# Patient Record
Sex: Female | Born: 1945 | Hispanic: No | Marital: Single | Smoking: Former smoker
Health system: Southern US, Community
[De-identification: ages and names within clinical notes are randomized; demographics above are authoritative.]

## PROBLEM LIST (undated history)

## (undated) DIAGNOSIS — N2889 Other specified disorders of kidney and ureter: Secondary | ICD-10-CM

## (undated) DIAGNOSIS — E119 Type 2 diabetes mellitus without complications: Secondary | ICD-10-CM

## (undated) DIAGNOSIS — I10 Essential (primary) hypertension: Secondary | ICD-10-CM

## (undated) DIAGNOSIS — H269 Unspecified cataract: Secondary | ICD-10-CM

## (undated) DIAGNOSIS — D649 Anemia, unspecified: Secondary | ICD-10-CM

## (undated) DIAGNOSIS — E039 Hypothyroidism, unspecified: Secondary | ICD-10-CM

## (undated) DIAGNOSIS — E1165 Type 2 diabetes mellitus with hyperglycemia: Secondary | ICD-10-CM

## (undated) DIAGNOSIS — E782 Mixed hyperlipidemia: Secondary | ICD-10-CM

## (undated) DIAGNOSIS — N182 Chronic kidney disease, stage 2 (mild): Secondary | ICD-10-CM

## (undated) DIAGNOSIS — Z794 Long term (current) use of insulin: Secondary | ICD-10-CM

## (undated) DIAGNOSIS — I129 Hypertensive chronic kidney disease with stage 1 through stage 4 chronic kidney disease, or unspecified chronic kidney disease: Secondary | ICD-10-CM

## (undated) DIAGNOSIS — I739 Peripheral vascular disease, unspecified: Secondary | ICD-10-CM

## (undated) HISTORY — DX: Anemia, unspecified: D64.9

## (undated) HISTORY — DX: Mixed hyperlipidemia: E78.2

## (undated) HISTORY — PX: APPENDECTOMY: SHX54

## (undated) HISTORY — DX: Peripheral vascular disease, unspecified: I73.9

## (undated) HISTORY — DX: Hypertensive chronic kidney disease with stage 1 through stage 4 chronic kidney disease, or unspecified chronic kidney disease: I12.9

## (undated) HISTORY — PX: COLONOSCOPY: SHX174

## (undated) HISTORY — DX: Hypothyroidism, unspecified: E03.9

## (undated) HISTORY — DX: Essential (primary) hypertension: I10

## (undated) HISTORY — PX: CATARACT EXTRACTION, BILATERAL: SHX1313

## (undated) HISTORY — DX: Morbid (severe) obesity due to excess calories: E66.01

## (undated) HISTORY — DX: Other specified disorders of kidney and ureter: N28.89

## (undated) HISTORY — DX: Chronic kidney disease, stage 2 (mild): N18.2

## (undated) HISTORY — DX: Long term (current) use of insulin: Z79.4

## (undated) HISTORY — DX: Type 2 diabetes mellitus with hyperglycemia: E11.65

## (undated) HISTORY — DX: Type 2 diabetes mellitus without complications: E11.9

## (undated) HISTORY — PX: CATARACT EXTRACTION W/ INTRAOCULAR LENS IMPLANT: SHX1309

## (undated) HISTORY — DX: Unspecified cataract: H26.9

---

## 1977-11-12 HISTORY — PX: TUBAL LIGATION: SHX77

## 2010-09-21 ENCOUNTER — Ambulatory Visit: Payer: Self-pay | Admitting: Family Medicine

## 2010-12-03 ENCOUNTER — Encounter: Payer: Self-pay | Admitting: Emergency Medicine

## 2011-12-12 ENCOUNTER — Ambulatory Visit: Payer: Self-pay | Admitting: Family Medicine

## 2013-02-11 ENCOUNTER — Ambulatory Visit: Payer: Self-pay

## 2013-02-16 ENCOUNTER — Ambulatory Visit: Payer: Self-pay

## 2014-02-18 ENCOUNTER — Ambulatory Visit: Payer: Self-pay | Admitting: Family Medicine

## 2014-11-12 DIAGNOSIS — H269 Unspecified cataract: Secondary | ICD-10-CM | POA: Insufficient documentation

## 2014-11-12 HISTORY — DX: Unspecified cataract: H26.9

## 2015-04-07 DIAGNOSIS — Z1231 Encounter for screening mammogram for malignant neoplasm of breast: Secondary | ICD-10-CM | POA: Diagnosis not present

## 2015-09-06 DIAGNOSIS — Z23 Encounter for immunization: Secondary | ICD-10-CM | POA: Diagnosis not present

## 2015-10-05 DIAGNOSIS — J020 Streptococcal pharyngitis: Secondary | ICD-10-CM | POA: Diagnosis not present

## 2015-10-10 DIAGNOSIS — J069 Acute upper respiratory infection, unspecified: Secondary | ICD-10-CM | POA: Diagnosis not present

## 2015-11-01 DIAGNOSIS — Z9181 History of falling: Secondary | ICD-10-CM | POA: Diagnosis not present

## 2015-11-01 DIAGNOSIS — I10 Essential (primary) hypertension: Secondary | ICD-10-CM | POA: Diagnosis not present

## 2015-11-01 DIAGNOSIS — Z1389 Encounter for screening for other disorder: Secondary | ICD-10-CM | POA: Diagnosis not present

## 2015-11-01 DIAGNOSIS — E785 Hyperlipidemia, unspecified: Secondary | ICD-10-CM | POA: Diagnosis not present

## 2015-11-01 DIAGNOSIS — E119 Type 2 diabetes mellitus without complications: Secondary | ICD-10-CM | POA: Diagnosis not present

## 2015-11-01 DIAGNOSIS — E559 Vitamin D deficiency, unspecified: Secondary | ICD-10-CM | POA: Diagnosis not present

## 2015-11-01 DIAGNOSIS — Z780 Asymptomatic menopausal state: Secondary | ICD-10-CM | POA: Diagnosis not present

## 2015-11-01 DIAGNOSIS — Z79899 Other long term (current) drug therapy: Secondary | ICD-10-CM | POA: Diagnosis not present

## 2015-11-24 DIAGNOSIS — H26493 Other secondary cataract, bilateral: Secondary | ICD-10-CM | POA: Diagnosis not present

## 2015-11-24 DIAGNOSIS — H524 Presbyopia: Secondary | ICD-10-CM | POA: Diagnosis not present

## 2015-11-24 DIAGNOSIS — E113592 Type 2 diabetes mellitus with proliferative diabetic retinopathy without macular edema, left eye: Secondary | ICD-10-CM | POA: Diagnosis not present

## 2015-11-28 DIAGNOSIS — Z1231 Encounter for screening mammogram for malignant neoplasm of breast: Secondary | ICD-10-CM | POA: Diagnosis not present

## 2015-11-28 DIAGNOSIS — M8589 Other specified disorders of bone density and structure, multiple sites: Secondary | ICD-10-CM | POA: Diagnosis not present

## 2015-11-28 DIAGNOSIS — Z13820 Encounter for screening for osteoporosis: Secondary | ICD-10-CM | POA: Diagnosis not present

## 2015-12-22 DIAGNOSIS — E113493 Type 2 diabetes mellitus with severe nonproliferative diabetic retinopathy without macular edema, bilateral: Secondary | ICD-10-CM | POA: Diagnosis not present

## 2016-01-11 DIAGNOSIS — M791 Myalgia: Secondary | ICD-10-CM | POA: Diagnosis not present

## 2016-01-11 DIAGNOSIS — Z20828 Contact with and (suspected) exposure to other viral communicable diseases: Secondary | ICD-10-CM | POA: Diagnosis not present

## 2016-01-30 DIAGNOSIS — Z7689 Persons encountering health services in other specified circumstances: Secondary | ICD-10-CM | POA: Diagnosis not present

## 2016-01-30 DIAGNOSIS — E119 Type 2 diabetes mellitus without complications: Secondary | ICD-10-CM | POA: Diagnosis not present

## 2016-01-31 DIAGNOSIS — Z23 Encounter for immunization: Secondary | ICD-10-CM | POA: Diagnosis not present

## 2016-01-31 DIAGNOSIS — E119 Type 2 diabetes mellitus without complications: Secondary | ICD-10-CM | POA: Diagnosis not present

## 2016-01-31 DIAGNOSIS — E785 Hyperlipidemia, unspecified: Secondary | ICD-10-CM | POA: Diagnosis not present

## 2016-01-31 DIAGNOSIS — E1140 Type 2 diabetes mellitus with diabetic neuropathy, unspecified: Secondary | ICD-10-CM | POA: Diagnosis not present

## 2016-01-31 DIAGNOSIS — I10 Essential (primary) hypertension: Secondary | ICD-10-CM | POA: Diagnosis not present

## 2016-05-14 DIAGNOSIS — E119 Type 2 diabetes mellitus without complications: Secondary | ICD-10-CM | POA: Diagnosis not present

## 2016-05-14 DIAGNOSIS — Z79899 Other long term (current) drug therapy: Secondary | ICD-10-CM | POA: Diagnosis not present

## 2016-05-14 DIAGNOSIS — E559 Vitamin D deficiency, unspecified: Secondary | ICD-10-CM | POA: Diagnosis not present

## 2016-05-14 DIAGNOSIS — E1140 Type 2 diabetes mellitus with diabetic neuropathy, unspecified: Secondary | ICD-10-CM | POA: Diagnosis not present

## 2016-05-14 DIAGNOSIS — E785 Hyperlipidemia, unspecified: Secondary | ICD-10-CM | POA: Diagnosis not present

## 2016-05-14 DIAGNOSIS — I10 Essential (primary) hypertension: Secondary | ICD-10-CM | POA: Diagnosis not present

## 2016-05-29 DIAGNOSIS — E1140 Type 2 diabetes mellitus with diabetic neuropathy, unspecified: Secondary | ICD-10-CM | POA: Diagnosis not present

## 2016-05-29 DIAGNOSIS — Z139 Encounter for screening, unspecified: Secondary | ICD-10-CM | POA: Diagnosis not present

## 2016-05-29 DIAGNOSIS — I10 Essential (primary) hypertension: Secondary | ICD-10-CM | POA: Diagnosis not present

## 2016-06-14 DIAGNOSIS — E113393 Type 2 diabetes mellitus with moderate nonproliferative diabetic retinopathy without macular edema, bilateral: Secondary | ICD-10-CM | POA: Diagnosis not present

## 2016-09-03 DIAGNOSIS — I10 Essential (primary) hypertension: Secondary | ICD-10-CM | POA: Diagnosis not present

## 2016-09-03 DIAGNOSIS — Z79899 Other long term (current) drug therapy: Secondary | ICD-10-CM | POA: Diagnosis not present

## 2016-09-03 DIAGNOSIS — Z23 Encounter for immunization: Secondary | ICD-10-CM | POA: Diagnosis not present

## 2016-09-03 DIAGNOSIS — E1140 Type 2 diabetes mellitus with diabetic neuropathy, unspecified: Secondary | ICD-10-CM | POA: Diagnosis not present

## 2016-09-03 DIAGNOSIS — E785 Hyperlipidemia, unspecified: Secondary | ICD-10-CM | POA: Diagnosis not present

## 2016-10-17 DIAGNOSIS — E113393 Type 2 diabetes mellitus with moderate nonproliferative diabetic retinopathy without macular edema, bilateral: Secondary | ICD-10-CM | POA: Diagnosis not present

## 2016-12-04 DIAGNOSIS — E6609 Other obesity due to excess calories: Secondary | ICD-10-CM | POA: Diagnosis not present

## 2016-12-04 DIAGNOSIS — I10 Essential (primary) hypertension: Secondary | ICD-10-CM | POA: Diagnosis not present

## 2016-12-04 DIAGNOSIS — E1065 Type 1 diabetes mellitus with hyperglycemia: Secondary | ICD-10-CM | POA: Diagnosis not present

## 2017-01-01 DIAGNOSIS — I10 Essential (primary) hypertension: Secondary | ICD-10-CM | POA: Diagnosis not present

## 2017-01-01 DIAGNOSIS — E1065 Type 1 diabetes mellitus with hyperglycemia: Secondary | ICD-10-CM | POA: Diagnosis not present

## 2017-02-25 DIAGNOSIS — E113393 Type 2 diabetes mellitus with moderate nonproliferative diabetic retinopathy without macular edema, bilateral: Secondary | ICD-10-CM | POA: Diagnosis not present

## 2017-03-12 DIAGNOSIS — E1065 Type 1 diabetes mellitus with hyperglycemia: Secondary | ICD-10-CM | POA: Diagnosis not present

## 2017-05-07 DIAGNOSIS — I10 Essential (primary) hypertension: Secondary | ICD-10-CM | POA: Diagnosis not present

## 2017-05-07 DIAGNOSIS — E1065 Type 1 diabetes mellitus with hyperglycemia: Secondary | ICD-10-CM | POA: Diagnosis not present

## 2017-07-16 DIAGNOSIS — E1165 Type 2 diabetes mellitus with hyperglycemia: Secondary | ICD-10-CM | POA: Diagnosis not present

## 2017-07-16 DIAGNOSIS — E6609 Other obesity due to excess calories: Secondary | ICD-10-CM | POA: Diagnosis not present

## 2017-07-16 DIAGNOSIS — I10 Essential (primary) hypertension: Secondary | ICD-10-CM | POA: Diagnosis not present

## 2017-08-05 DIAGNOSIS — E113291 Type 2 diabetes mellitus with mild nonproliferative diabetic retinopathy without macular edema, right eye: Secondary | ICD-10-CM | POA: Diagnosis not present

## 2017-08-05 DIAGNOSIS — E113292 Type 2 diabetes mellitus with mild nonproliferative diabetic retinopathy without macular edema, left eye: Secondary | ICD-10-CM | POA: Diagnosis not present

## 2017-08-05 DIAGNOSIS — E113393 Type 2 diabetes mellitus with moderate nonproliferative diabetic retinopathy without macular edema, bilateral: Secondary | ICD-10-CM | POA: Diagnosis not present

## 2017-09-12 DIAGNOSIS — E113393 Type 2 diabetes mellitus with moderate nonproliferative diabetic retinopathy without macular edema, bilateral: Secondary | ICD-10-CM | POA: Diagnosis not present

## 2017-10-30 DIAGNOSIS — E113292 Type 2 diabetes mellitus with mild nonproliferative diabetic retinopathy without macular edema, left eye: Secondary | ICD-10-CM | POA: Diagnosis not present

## 2017-10-30 DIAGNOSIS — H40003 Preglaucoma, unspecified, bilateral: Secondary | ICD-10-CM | POA: Diagnosis not present

## 2017-10-30 DIAGNOSIS — H524 Presbyopia: Secondary | ICD-10-CM | POA: Diagnosis not present

## 2017-11-26 DIAGNOSIS — E6609 Other obesity due to excess calories: Secondary | ICD-10-CM | POA: Diagnosis not present

## 2017-11-26 DIAGNOSIS — I10 Essential (primary) hypertension: Secondary | ICD-10-CM | POA: Diagnosis not present

## 2017-11-26 DIAGNOSIS — E1165 Type 2 diabetes mellitus with hyperglycemia: Secondary | ICD-10-CM | POA: Diagnosis not present

## 2018-02-13 DIAGNOSIS — E113393 Type 2 diabetes mellitus with moderate nonproliferative diabetic retinopathy without macular edema, bilateral: Secondary | ICD-10-CM | POA: Diagnosis not present

## 2018-03-11 DIAGNOSIS — E6609 Other obesity due to excess calories: Secondary | ICD-10-CM | POA: Diagnosis not present

## 2018-03-11 DIAGNOSIS — I10 Essential (primary) hypertension: Secondary | ICD-10-CM | POA: Diagnosis not present

## 2018-03-11 DIAGNOSIS — E1065 Type 1 diabetes mellitus with hyperglycemia: Secondary | ICD-10-CM | POA: Diagnosis not present

## 2018-03-11 DIAGNOSIS — E1165 Type 2 diabetes mellitus with hyperglycemia: Secondary | ICD-10-CM | POA: Diagnosis not present

## 2018-06-26 DIAGNOSIS — E113499 Type 2 diabetes mellitus with severe nonproliferative diabetic retinopathy without macular edema, unspecified eye: Secondary | ICD-10-CM | POA: Diagnosis not present

## 2018-07-16 DIAGNOSIS — I10 Essential (primary) hypertension: Secondary | ICD-10-CM | POA: Diagnosis not present

## 2018-07-22 DIAGNOSIS — Z23 Encounter for immunization: Secondary | ICD-10-CM | POA: Diagnosis not present

## 2018-07-22 DIAGNOSIS — Z0000 Encounter for general adult medical examination without abnormal findings: Secondary | ICD-10-CM | POA: Diagnosis not present

## 2018-07-22 DIAGNOSIS — Z780 Asymptomatic menopausal state: Secondary | ICD-10-CM | POA: Diagnosis not present

## 2018-07-29 DIAGNOSIS — I10 Essential (primary) hypertension: Secondary | ICD-10-CM | POA: Diagnosis not present

## 2018-07-29 DIAGNOSIS — Z0000 Encounter for general adult medical examination without abnormal findings: Secondary | ICD-10-CM | POA: Diagnosis not present

## 2018-07-29 DIAGNOSIS — N182 Chronic kidney disease, stage 2 (mild): Secondary | ICD-10-CM | POA: Diagnosis not present

## 2018-07-29 DIAGNOSIS — I129 Hypertensive chronic kidney disease with stage 1 through stage 4 chronic kidney disease, or unspecified chronic kidney disease: Secondary | ICD-10-CM | POA: Diagnosis not present

## 2018-07-29 DIAGNOSIS — E782 Mixed hyperlipidemia: Secondary | ICD-10-CM | POA: Diagnosis not present

## 2018-07-29 DIAGNOSIS — E6609 Other obesity due to excess calories: Secondary | ICD-10-CM | POA: Diagnosis not present

## 2018-07-29 DIAGNOSIS — E1169 Type 2 diabetes mellitus with other specified complication: Secondary | ICD-10-CM | POA: Diagnosis not present

## 2018-07-29 DIAGNOSIS — E1165 Type 2 diabetes mellitus with hyperglycemia: Secondary | ICD-10-CM | POA: Diagnosis not present

## 2018-07-29 DIAGNOSIS — Z794 Long term (current) use of insulin: Secondary | ICD-10-CM | POA: Diagnosis not present

## 2018-08-05 DIAGNOSIS — I129 Hypertensive chronic kidney disease with stage 1 through stage 4 chronic kidney disease, or unspecified chronic kidney disease: Secondary | ICD-10-CM | POA: Diagnosis not present

## 2018-08-05 DIAGNOSIS — Z1212 Encounter for screening for malignant neoplasm of rectum: Secondary | ICD-10-CM | POA: Diagnosis not present

## 2018-08-05 DIAGNOSIS — E1165 Type 2 diabetes mellitus with hyperglycemia: Secondary | ICD-10-CM | POA: Diagnosis not present

## 2018-08-05 DIAGNOSIS — E782 Mixed hyperlipidemia: Secondary | ICD-10-CM | POA: Diagnosis not present

## 2018-08-05 DIAGNOSIS — N182 Chronic kidney disease, stage 2 (mild): Secondary | ICD-10-CM | POA: Diagnosis not present

## 2018-08-05 DIAGNOSIS — Z1211 Encounter for screening for malignant neoplasm of colon: Secondary | ICD-10-CM | POA: Diagnosis not present

## 2018-08-05 LAB — COLOGUARD

## 2018-08-19 DIAGNOSIS — R195 Other fecal abnormalities: Secondary | ICD-10-CM | POA: Diagnosis not present

## 2018-09-02 ENCOUNTER — Encounter: Payer: Self-pay | Admitting: Gastroenterology

## 2018-09-25 DIAGNOSIS — E113412 Type 2 diabetes mellitus with severe nonproliferative diabetic retinopathy with macular edema, left eye: Secondary | ICD-10-CM | POA: Diagnosis not present

## 2018-09-25 DIAGNOSIS — E113413 Type 2 diabetes mellitus with severe nonproliferative diabetic retinopathy with macular edema, bilateral: Secondary | ICD-10-CM | POA: Diagnosis not present

## 2018-10-01 ENCOUNTER — Ambulatory Visit: Payer: Medicare HMO | Admitting: Gastroenterology

## 2018-10-01 ENCOUNTER — Encounter (INDEPENDENT_AMBULATORY_CARE_PROVIDER_SITE_OTHER): Payer: Self-pay

## 2018-10-01 ENCOUNTER — Encounter: Payer: Self-pay | Admitting: Gastroenterology

## 2018-10-01 VITALS — BP 132/58 | HR 76 | Ht 60.25 in | Wt 211.5 lb

## 2018-10-01 DIAGNOSIS — R195 Other fecal abnormalities: Secondary | ICD-10-CM | POA: Diagnosis not present

## 2018-10-01 NOTE — Patient Instructions (Signed)
You have been scheduled for a colonoscopy. Please follow written instructions given to you at your visit today.  Please pick up your prep supplies at the pharmacy within the next 1-3 days. If you use inhalers (even only as needed), please bring them with you on the day of your procedure.  We have given you a Plenvu sample prep kit today   If you are age 70 or older, your body mass index should be between 23-30. Your Body mass index is 40.96 kg/m. If this is out of the aforementioned range listed, please consider follow up with your Primary Care Provider.  If you are age 70 or younger, your body mass index should be between 19-25. Your Body mass index is 40.96 kg/m. If this is out of the aformentioned range listed, please consider follow up with your Primary Care Provider.    Thank you for choosing [facility]  Karleen Hampshire Nandigam,MD

## 2018-10-01 NOTE — Progress Notes (Signed)
Sara [name]    [id number]    1945-12-05  Primary Care Physician:Ramachandran, Mauro Kaufmann, MD  Referring Physician: Merrilee Seashore, MD   Chief complaint: Positive cologaurd  HPI:  72 year old female with history of hypertension, diabetes here for evaluation of positive cologaurd. Denies any blood in stool or blood per rectum.  Never had colonoscopy. Denies any recent change in bowel habits, loss of appetite or weight loss. No heartburn, regurgitation, nausea, vomiting, dysphagia, abdominal pain. No family history of colon cancer. No history of NSAID use, antiplatelets or anticoagulants.   Outpatient Encounter Medications as of 10/01/2018  Medication Sig  . amLODipine (NORVASC) 10 MG tablet Take 10 mg by mouth daily.  Marland Kitchen lisinopril-hydrochlorothiazide (PRINZIDE,ZESTORETIC) 20-12.5 MG tablet Take 1 tablet by mouth daily.  . metFORMIN (GLUCOPHAGE) 1000 MG tablet Take 1,000 mg by mouth daily with breakfast.  . NOVOLOG MIX 70/30 FLEXPEN (70-30) 100 UNIT/ML FlexPen INJECT 25 UNITS SUBCUTANEOUSLY IN THE MORNING THEN INJECT 5 UNITS AT LUNCH AND 40 UNITS IN THE EVENING. TITRATE TO MMD 100 UNITS   No facility-administered encounter medications on file as of 10/01/2018.     Allergies as of 10/01/2018  . (Not on File)    Past Medical History:  Diagnosis Date  . Anemia   . DM (diabetes mellitus) ([place])   . HTN (hypertension)     Past Surgical History:  Procedure Laterality Date  . APPENDECTOMY    . CATARACT EXTRACTION, BILATERAL    . CESAREAN SECTION    . TUBAL LIGATION      Family History  Problem Relation Age of Onset  . Heart disease Mother   . Diabetes Brother   . Diabetes Maternal Uncle     Social History   Socioeconomic History  . Marital status: Single    Spouse name: Not on file  . Number of children: 2  . Years of education: Not on file  . Highest education level: Not on file  Occupational History  . Occupation: retired  Scientific laboratory technician    . Financial resource strain: Not on file  . Food insecurity:    Worry: Not on file    Inability: Not on file  . Transportation needs:    Medical: Not on file    Non-medical: Not on file  Tobacco Use  . Smoking status: Former Smoker    Types: Cigarettes    Last attempt to quit: 10/01/2008    Years since quitting: 10.0  . Smokeless tobacco: Never Used  Substance and Sexual Activity  . Alcohol use: Yes    Comment: occasional  . Drug use: Never  . Sexual activity: Not on file  Lifestyle  . Physical activity:    Days per week: Not on file    Minutes per session: Not on file  . Stress: Not on file  Relationships  . Social connections:    Talks on phone: Not on file    Gets together: Not on file    Attends religious service: Not on file    Active member of club or organization: Not on file    Attends meetings of clubs or organizations: Not on file    Relationship status: Not on file  . Intimate partner violence:    Fear of current or ex partner: Not on file    Emotionally abused: Not on file    Physically abused: Not on file    Forced sexual activity: Not on file  Other Topics  Concern  . Not on file  Social History Narrative  . Not on file      Review of systems: Review of Systems  Constitutional: Negative for fever and chills.  HENT: Negative.   Eyes: Negative for blurred vision.  Respiratory: Negative for cough, shortness of breath and wheezing.   Cardiovascular: Negative for chest pain and palpitations.  Gastrointestinal: as per HPI Genitourinary: Negative for dysuria, urgency, frequency and hematuria.  Musculoskeletal: Positive for myalgias, back pain and joint pain.  Skin: Negative for itching and rash.  Neurological: Negative for dizziness, tremors, focal weakness, seizures and loss of consciousness.  Endo/Heme/Allergies: Positive for seasonal allergies.  Psychiatric/Behavioral: Negative for depression, suicidal ideas and hallucinations.  All other systems  reviewed and are negative.   Physical Exam: Vitals:   10/01/18 0925  BP: (!) 132/58  Pulse: 76   Body mass index is 40.96 kg/m. Gen:      No acute distress HEENT:  EOMI, sclera anicteric Neck:     No masses; no thyromegaly Lungs:    Clear to auscultation bilaterally; normal respiratory effort CV:         Regular rate and rhythm; no murmurs Abd:      + bowel sounds; soft, non-tender; no palpable masses, no distension Ext:    Mild ankle edema; adequate peripheral perfusion Skin:      Warm and dry; no rash Neuro: alert and oriented x 3 Psych: normal mood and affect  Data Reviewed:  Reviewed labs, radiology imaging, old records and pertinent past GI work up   Assessment and Plan/Recommendations: 72 year old female with history of hypertension, diabetes and morbid obesity with positive cologaurd We will schedule for colonoscopy for further evaluation The risks and benefits as well as alternatives of endoscopic procedure(s) have been discussed and reviewed. All questions answered. The patient agrees to proceed.    Damaris Hippo , MD [phone]    CC: No ref. provider found

## 2018-10-21 DIAGNOSIS — E113411 Type 2 diabetes mellitus with severe nonproliferative diabetic retinopathy with macular edema, right eye: Secondary | ICD-10-CM | POA: Diagnosis not present

## 2018-10-21 DIAGNOSIS — E113413 Type 2 diabetes mellitus with severe nonproliferative diabetic retinopathy with macular edema, bilateral: Secondary | ICD-10-CM | POA: Diagnosis not present

## 2018-10-21 DIAGNOSIS — E113412 Type 2 diabetes mellitus with severe nonproliferative diabetic retinopathy with macular edema, left eye: Secondary | ICD-10-CM | POA: Diagnosis not present

## 2018-10-28 ENCOUNTER — Ambulatory Visit (AMBULATORY_SURGERY_CENTER): Payer: Medicare HMO | Admitting: Gastroenterology

## 2018-10-28 ENCOUNTER — Encounter: Payer: Self-pay | Admitting: Gastroenterology

## 2018-10-28 VITALS — BP 128/68 | HR 71 | Temp 97.5°F | Resp 17 | Ht 60.0 in | Wt 211.0 lb

## 2018-10-28 DIAGNOSIS — D123 Benign neoplasm of transverse colon: Secondary | ICD-10-CM | POA: Diagnosis not present

## 2018-10-28 DIAGNOSIS — E119 Type 2 diabetes mellitus without complications: Secondary | ICD-10-CM | POA: Diagnosis not present

## 2018-10-28 DIAGNOSIS — D122 Benign neoplasm of ascending colon: Secondary | ICD-10-CM

## 2018-10-28 DIAGNOSIS — I10 Essential (primary) hypertension: Secondary | ICD-10-CM | POA: Diagnosis not present

## 2018-10-28 DIAGNOSIS — D120 Benign neoplasm of cecum: Secondary | ICD-10-CM

## 2018-10-28 DIAGNOSIS — D124 Benign neoplasm of descending colon: Secondary | ICD-10-CM

## 2018-10-28 DIAGNOSIS — R195 Other fecal abnormalities: Secondary | ICD-10-CM

## 2018-10-28 MED ORDER — SODIUM CHLORIDE 0.9 % IV SOLN: 500.0000 mL | Freq: Once | INTRAVENOUS | Status: DC

## 2018-10-28 NOTE — Patient Instructions (Signed)
INFORMATION ON POLYPS,DIVERTICULOSIS,&HEMORRHOIDS GIVEN TO YOU TODAY  NO ASPIRIN, ASPIRIN CONTAINING PRODUCTS (BC OR GOODY POWDERS) OR NSAIDS (IBUPROFEN, ADVIL, ALEVE, AND MOTRIN) FOR 2 WEEKS TYLENOL IS OK TO TAKE  A CARD WAS GIVEN TO YOU TODAY THAT SAYS 2 CLIPS WERE PLACED IN YOUR TRANSVERSE COLON .CARRY THIS CARD IN YOUR WALLET IN CASE YOU HAVE TO HAVE ANY XRAY ,MRI ,OR OTHER PROCEDURE DONE THEY CAN BE AWARE   YOU HAD AN ENDOSCOPIC PROCEDURE TODAY AT [place]:   Refer to the procedure report that was given to you for any specific questions about what was found during the examination.  If the procedure report does not answer your questions, please call your gastroenterologist to clarify.  If you requested that your care partner not be given the details of your procedure findings, then the procedure report has been included in a sealed envelope for you to review at your convenience later.  YOU SHOULD EXPECT: Some feelings of bloating in the abdomen. Passage of more gas than usual.  Walking can help get rid of the air that was put into your GI tract during the procedure and reduce the bloating. If you had a lower endoscopy (such as a colonoscopy or flexible sigmoidoscopy) you may notice spotting of blood in your stool or on the toilet paper. If you underwent a bowel prep for your procedure, you may not have a normal bowel movement for a few days.  Please Note:  You might notice some irritation and congestion in your nose or some drainage.  This is from the oxygen used during your procedure.  There is no need for concern and it should clear up in a day or so.  SYMPTOMS TO REPORT IMMEDIATELY:   Following lower endoscopy (colonoscopy or flexible sigmoidoscopy):  Excessive amounts of blood in the stool  Significant tenderness or worsening of abdominal pains  Swelling of the abdomen that is new, acute  Fever of 100F or higher    For urgent or emergent issues, a  gastroenterologist can be reached at any hour by calling [phone].   DIET:  We do recommend a small meal at first, but then you may proceed to your regular diet.  Drink plenty of fluids but you should avoid alcoholic beverages for 24 hours.  ACTIVITY:  You should plan to take it easy for the rest of today and you should NOT DRIVE or use heavy machinery until tomorrow (because of the sedation medicines used during the test).    FOLLOW UP: Our staff will call the number listed on your records the next business day following your procedure to check on you and address any questions or concerns that you may have regarding the information given to you following your procedure. If we do not reach you, we will leave a message.  However, if you are feeling well and you are not experiencing any problems, there is no need to return our call.  We will assume that you have returned to your regular daily activities without incident.  If any biopsies were taken you will be contacted by phone or by letter within the next 1-3 weeks.  Please call us at [phone] if you have not heard about the biopsies in 3 weeks.    SIGNATURES/CONFIDENTIALITY: You and/or your care partner have signed paperwork which will be entered into your electronic medical record.  These signatures attest to the fact that that the information above on your After Visit Summary  has been reviewed and is understood.  Full responsibility of the confidentiality of this discharge information lies with you and/or your care-partner.

## 2018-10-28 NOTE — Progress Notes (Signed)
Called to room to assist during endoscopic procedure.  Patient ID and intended procedure confirmed with present staff. Received instructions for my participation in the procedure from the performing physician.  

## 2018-10-28 NOTE — Op Note (Signed)
[facility] Patient Name: Sara [name] Procedure Date: 10/28/2018 3:38 PM MRN: [id number] Endoscopist: Mauri Pole , MD Age: 72 Referring MD:  Date of Birth: 1945/12/20 Gender: Female Account #: [id number] Procedure:                Colonoscopy Indications:              Positive Cologuard test Medicines:                Monitored Anesthesia Care Procedure:                Pre-Anesthesia Assessment:                           - Prior to the procedure, a History and Physical                            was performed, and patient medications and                            allergies were reviewed. The patient's tolerance of                            previous anesthesia was also reviewed. The risks                            and benefits of the procedure and the sedation                            options and risks were discussed with the patient.                            All questions were answered, and informed consent                            was obtained. Prior Anticoagulants: The patient has                            taken no previous anticoagulant or antiplatelet                            agents. ASA Grade Assessment: III - A patient with                            severe systemic disease. After reviewing the risks                            and benefits, the patient was deemed in                            satisfactory condition to undergo the procedure.                           After obtaining informed consent, the colonoscope  was passed under direct vision. Throughout the                            procedure, the patient's blood pressure, pulse, and                            oxygen saturations were monitored continuously. The                            Colonoscope was introduced through the anus and                            advanced to the the cecum, identified by                            appendiceal orifice and ileocecal  valve. The                            colonoscopy was technically difficult and complex                            due to the patient's body habitus. The patient                            tolerated the procedure well. The quality of the                            bowel preparation was excellent. The ileocecal                            valve, appendiceal orifice, and rectum were                            photographed. Scope In: 3:40:48 PM Scope Out: 4:12:41 PM Scope Withdrawal Time: 0 hours 25 minutes 4 seconds  Total Procedure Duration: 0 hours 31 minutes 53 seconds  Findings:                 The perianal and digital rectal examinations were                            normal.                           Three sessile polyps were found in the transverse                            colon, ascending colon and cecum. The polyps were 4                            to 6 mm in size. These polyps were removed with a                            cold snare. Resection and retrieval were complete.  Three semi-pedunculated polyps were found in the                            sigmoid colon, ascending colon and ileocecal valve.                            The polyps were 7 to 11 mm in size. These polyps                            were removed with a hot snare. Resection and                            retrieval were complete.                           A 7 mm polyp was found in the transverse colon. The                            polyp was semi-pedunculated. The polyp was removed                            with a hot snare. Resection and retrieval were                            complete. To prevent bleeding after the                            polypectomy, two hemostatic clips were successfully                            placed (MR conditional). There was no bleeding at                            the end of the procedure.                           Three sessile polyps were found in  the descending                            colon and transverse colon. The polyps were 1 to 3                            mm in size. These polyps were removed with a cold                            biopsy forceps. Resection and retrieval were                            complete.                           Scattered small and large-mouthed diverticula were  found in the sigmoid colon, descending colon and                            ascending colon.                           Non-bleeding internal hemorrhoids were found during                            retroflexion. The hemorrhoids were small. Complications:            No immediate complications. Estimated Blood Loss:     Estimated blood loss was minimal. Impression:               - Three 4 to 6 mm polyps in the transverse colon,                            in the ascending colon and in the cecum, removed                            with a cold snare. Resected and retrieved.                           - Three 7 to 11 mm polyps in the sigmoid colon, in                            the ascending colon and at the ileocecal valve,                            removed with a hot snare. Resected and retrieved.                           - One 7 mm polyp in the transverse colon, removed                            with a hot snare. Resected and retrieved. Clips (MR                            conditional) were placed.                           - Three 1 to 3 mm polyps in the descending colon                            and in the transverse colon, removed with a cold                            biopsy forceps. Resected and retrieved.                           - Moderate diverticulosis in the sigmoid colon, in  the descending colon and in the ascending colon.                           - Non-bleeding internal hemorrhoids. Recommendation:           - Patient has a contact number available for                             emergencies. The signs and symptoms of potential                            delayed complications were discussed with the                            patient. Return to normal activities tomorrow.                            Written discharge instructions were provided to the                            patient.                           - Resume previous diet.                           - Continue present medications.                           - Await pathology results.                           - Repeat colonoscopy date to be determined after                            pending pathology results are reviewed for                            surveillance based on pathology results. Mauri Pole, MD 10/28/2018 4:19:28 PM This report has been signed electronically.

## 2018-10-28 NOTE — Progress Notes (Signed)
Report to PACU, RN, vss, BBS= Clear.  

## 2018-10-29 ENCOUNTER — Telehealth: Payer: Self-pay

## 2018-10-29 NOTE — Telephone Encounter (Signed)
  Follow up Call-  Call back number 10/28/2018  Post procedure Call Back phone  # [phone]  Permission to leave phone message Yes  Some recent data might be hidden     No answer

## 2018-10-29 NOTE — Telephone Encounter (Signed)
  Follow up Call-  Call back number 10/28/2018  Post procedure Call Back phone  # [phone]  Permission to leave phone message Yes  Some recent data might be hidden     Patient questions:  Do you have a fever, pain , or abdominal swelling? No. Pain Score  0 *  Have you tolerated food without any problems? Yes.    Have you been able to return to your normal activities? Yes.    Do you have any questions about your discharge instructions: Diet   No. Medications  No. Follow up visit  No.  Do you have questions or concerns about your Care? No.  Actions: * If pain score is 4 or above: No action needed, pain <4.  No problems noted per pt. maw

## 2018-11-03 ENCOUNTER — Encounter: Payer: Self-pay | Admitting: Gastroenterology

## 2018-12-04 DIAGNOSIS — E113413 Type 2 diabetes mellitus with severe nonproliferative diabetic retinopathy with macular edema, bilateral: Secondary | ICD-10-CM | POA: Diagnosis not present

## 2019-01-19 DIAGNOSIS — E113411 Type 2 diabetes mellitus with severe nonproliferative diabetic retinopathy with macular edema, right eye: Secondary | ICD-10-CM | POA: Diagnosis not present

## 2019-01-19 DIAGNOSIS — E113412 Type 2 diabetes mellitus with severe nonproliferative diabetic retinopathy with macular edema, left eye: Secondary | ICD-10-CM | POA: Diagnosis not present

## 2019-02-10 DIAGNOSIS — I10 Essential (primary) hypertension: Secondary | ICD-10-CM | POA: Diagnosis not present

## 2019-02-10 DIAGNOSIS — N182 Chronic kidney disease, stage 2 (mild): Secondary | ICD-10-CM | POA: Diagnosis not present

## 2019-02-10 DIAGNOSIS — E782 Mixed hyperlipidemia: Secondary | ICD-10-CM | POA: Diagnosis not present

## 2019-02-10 DIAGNOSIS — E1165 Type 2 diabetes mellitus with hyperglycemia: Secondary | ICD-10-CM | POA: Diagnosis not present

## 2019-04-16 DIAGNOSIS — E113412 Type 2 diabetes mellitus with severe nonproliferative diabetic retinopathy with macular edema, left eye: Secondary | ICD-10-CM | POA: Diagnosis not present

## 2019-05-06 DIAGNOSIS — I10 Essential (primary) hypertension: Secondary | ICD-10-CM | POA: Diagnosis not present

## 2019-05-06 DIAGNOSIS — E782 Mixed hyperlipidemia: Secondary | ICD-10-CM | POA: Diagnosis not present

## 2019-05-06 DIAGNOSIS — E1165 Type 2 diabetes mellitus with hyperglycemia: Secondary | ICD-10-CM | POA: Diagnosis not present

## 2019-05-12 DIAGNOSIS — E1165 Type 2 diabetes mellitus with hyperglycemia: Secondary | ICD-10-CM | POA: Diagnosis not present

## 2019-05-12 DIAGNOSIS — E782 Mixed hyperlipidemia: Secondary | ICD-10-CM | POA: Diagnosis not present

## 2019-05-12 DIAGNOSIS — I10 Essential (primary) hypertension: Secondary | ICD-10-CM | POA: Diagnosis not present

## 2019-06-04 DIAGNOSIS — E113412 Type 2 diabetes mellitus with severe nonproliferative diabetic retinopathy with macular edema, left eye: Secondary | ICD-10-CM | POA: Diagnosis not present

## 2019-07-23 DIAGNOSIS — E113292 Type 2 diabetes mellitus with mild nonproliferative diabetic retinopathy without macular edema, left eye: Secondary | ICD-10-CM | POA: Diagnosis not present

## 2019-08-27 DIAGNOSIS — E782 Mixed hyperlipidemia: Secondary | ICD-10-CM | POA: Diagnosis not present

## 2019-08-27 DIAGNOSIS — E1165 Type 2 diabetes mellitus with hyperglycemia: Secondary | ICD-10-CM | POA: Diagnosis not present

## 2019-08-27 DIAGNOSIS — I10 Essential (primary) hypertension: Secondary | ICD-10-CM | POA: Diagnosis not present

## 2019-09-02 DIAGNOSIS — Z0000 Encounter for general adult medical examination without abnormal findings: Secondary | ICD-10-CM | POA: Diagnosis not present

## 2019-09-02 DIAGNOSIS — N182 Chronic kidney disease, stage 2 (mild): Secondary | ICD-10-CM | POA: Diagnosis not present

## 2019-09-02 DIAGNOSIS — Z23 Encounter for immunization: Secondary | ICD-10-CM | POA: Diagnosis not present

## 2019-09-02 DIAGNOSIS — E1165 Type 2 diabetes mellitus with hyperglycemia: Secondary | ICD-10-CM | POA: Diagnosis not present

## 2019-09-02 DIAGNOSIS — E782 Mixed hyperlipidemia: Secondary | ICD-10-CM | POA: Diagnosis not present

## 2019-09-02 DIAGNOSIS — E1169 Type 2 diabetes mellitus with other specified complication: Secondary | ICD-10-CM | POA: Diagnosis not present

## 2019-10-12 ENCOUNTER — Encounter: Payer: Self-pay | Admitting: Gastroenterology

## 2019-10-15 ENCOUNTER — Encounter: Payer: Self-pay | Admitting: Gastroenterology

## 2019-10-15 DIAGNOSIS — E113393 Type 2 diabetes mellitus with moderate nonproliferative diabetic retinopathy without macular edema, bilateral: Secondary | ICD-10-CM | POA: Diagnosis not present

## 2019-11-03 DIAGNOSIS — E1165 Type 2 diabetes mellitus with hyperglycemia: Secondary | ICD-10-CM | POA: Diagnosis not present

## 2019-11-03 DIAGNOSIS — I10 Essential (primary) hypertension: Secondary | ICD-10-CM | POA: Diagnosis not present

## 2019-11-03 DIAGNOSIS — N182 Chronic kidney disease, stage 2 (mild): Secondary | ICD-10-CM | POA: Diagnosis not present

## 2019-11-03 DIAGNOSIS — Z794 Long term (current) use of insulin: Secondary | ICD-10-CM | POA: Diagnosis not present

## 2019-11-03 DIAGNOSIS — E782 Mixed hyperlipidemia: Secondary | ICD-10-CM | POA: Diagnosis not present

## 2019-11-18 ENCOUNTER — Other Ambulatory Visit: Payer: Self-pay

## 2019-11-18 ENCOUNTER — Ambulatory Visit (AMBULATORY_SURGERY_CENTER): Payer: Medicare Other | Admitting: *Deleted

## 2019-11-18 VITALS — Temp 96.7°F | Ht 60.0 in | Wt 210.4 lb

## 2019-11-18 DIAGNOSIS — Z86010 Personal history of colonic polyps: Secondary | ICD-10-CM

## 2019-11-18 DIAGNOSIS — Z1159 Encounter for screening for other viral diseases: Secondary | ICD-10-CM

## 2019-11-18 NOTE — Progress Notes (Signed)

## 2019-11-18 NOTE — Progress Notes (Signed)
Patient given Plenvu sample Lot [id number] exp 02/2020

## 2019-11-19 ENCOUNTER — Encounter: Payer: Self-pay | Admitting: Gastroenterology

## 2019-11-27 ENCOUNTER — Other Ambulatory Visit: Payer: Self-pay | Admitting: Gastroenterology

## 2019-11-27 ENCOUNTER — Ambulatory Visit (INDEPENDENT_AMBULATORY_CARE_PROVIDER_SITE_OTHER): Payer: Medicare Other

## 2019-11-27 DIAGNOSIS — Z1159 Encounter for screening for other viral diseases: Secondary | ICD-10-CM

## 2019-11-30 LAB — SARS CORONAVIRUS 2 (TAT 6-24 HRS): SARS Coronavirus 2: NEGATIVE

## 2019-12-01 ENCOUNTER — Ambulatory Visit (AMBULATORY_SURGERY_CENTER): Payer: Medicare Other | Admitting: Gastroenterology

## 2019-12-01 ENCOUNTER — Other Ambulatory Visit: Payer: Self-pay

## 2019-12-01 ENCOUNTER — Encounter: Payer: Self-pay | Admitting: Gastroenterology

## 2019-12-01 VITALS — BP 91/64 | HR 74 | Temp 98.6°F | Resp 15 | Ht 60.0 in | Wt 210.4 lb

## 2019-12-01 DIAGNOSIS — Z860100 Personal history of colon polyps, unspecified: Secondary | ICD-10-CM

## 2019-12-01 DIAGNOSIS — D123 Benign neoplasm of transverse colon: Secondary | ICD-10-CM

## 2019-12-01 DIAGNOSIS — D128 Benign neoplasm of rectum: Secondary | ICD-10-CM

## 2019-12-01 DIAGNOSIS — D124 Benign neoplasm of descending colon: Secondary | ICD-10-CM | POA: Diagnosis not present

## 2019-12-01 DIAGNOSIS — D125 Benign neoplasm of sigmoid colon: Secondary | ICD-10-CM | POA: Diagnosis not present

## 2019-12-01 DIAGNOSIS — D127 Benign neoplasm of rectosigmoid junction: Secondary | ICD-10-CM | POA: Diagnosis not present

## 2019-12-01 MED ORDER — SODIUM CHLORIDE 0.9 % IV SOLN: 500.0000 mL | Freq: Once | INTRAVENOUS | Status: DC

## 2019-12-01 NOTE — Progress Notes (Signed)
Called to room to assist during endoscopic procedure.  Patient ID and intended procedure confirmed with present staff. Received instructions for my participation in the procedure from the performing physician.  

## 2019-12-01 NOTE — Patient Instructions (Addendum)
HANDOUTS PROVIDED ON: POLYPS, DIVERTICULOSIS, & HEMORRHOIDS  The polyps removed today have been sent for pathology.  The results can take 1-3 weeks to receive.  When your next colonoscopy should occur will be based on the pathology results.    You may resume your previous diet and medication schedule.  Thank you for allowing [place] to care for you today!!!  USTED TUVO UN PROCEDIMIENTO ENDOSCPICO HOY EN EL [facility]:   Lea el informe del procedimiento que se le entreg para cualquier pregunta especfica sobre lo que se Primary school teacher.  Si el informe del examen no responde a sus preguntas, por favor llame a su gastroenterlogo para aclararlo.  Si usted solicit que no se le den [employer] de lo que se Estate manager/land agent en su procedimiento al [employer] va a cuidar, entonces el informe del procedimiento se ha incluido en un sobre sellado para que usted lo revise despus cuando le sea ms conveniente.   LO QUE PUEDE ESPERAR: Algunas sensaciones de hinchazn en el abdomen.  Puede tener ms gases de lo normal.  El caminar puede ayudarle a eliminar el aire que se le puso en el tracto gastrointestinal durante el procedimiento y reducir la hinchazn.  Si le hicieron una endoscopia inferior (como una colonoscopia o una sigmoidoscopia flexible), podra notar manchas de sangre en las heces fecales o en el papel higinico.  Si se someti a una preparacin intestinal para su procedimiento, es posible que no tenga una evacuacin intestinal normal durante [employer].   Tenga en cuenta:  Es posible que note un poco de irritacin y congestin en la nariz o algn drenaje.  Esto es debido al oxgeno [employer] durante su procedimiento.  No hay que preocuparse y esto debe desaparecer ms o Scientist, research (medical).   SNTOMAS PARA REPORTAR INMEDIATAMENTE:  Despus de una endoscopia inferior (colonoscopia o sigmoidoscopia flexible):  Cantidades excesivas de sangre en las heces fecales  Sensibilidad  significativa o empeoramiento de los dolores abdominales   Hinchazn aguda del abdomen que antes no tena   Fiebre de 100F o ms   Para asuntos urgentes o de Freight forwarder, puede comunicarse con un gastroenterlogo a cualquier hora llamando al [phone].  DIETA:  Recomendamos una comida pequea al principio, pero luego puede continuar con su dieta normal.  Tome muchos lquidos, Teacher, adult education las bebidas alcohlicas durante 24 horas.    ACTIVIDAD:  Debe planear tomarse las cosas con calma por el resto del da y no debe CONDUCIR ni usar maquinaria pesada Programmer, applications (debido a los medicamentos de sedacin utilizados durante el examen).     SEGUIMIENTO: Nuestro personal llamar al nmero que aparece en su historial al siguiente da hbil de su procedimiento para ver cmo se siente y para responder cualquier pregunta o inquietud que pueda tener con respecto a la informacin que se le dio despus del procedimiento. Si no podemos contactarle, le dejaremos un mensaje.  Sin embargo, si se siente bien y no tiene Paediatric nurse, no es necesario que nos devuelva la llamada.  Asumiremos que ha regresado a sus actividades diarias normales sin incidentes. Si se le tomaron algunas biopsias, le contactaremos por telfono o por carta en las prximas 3 semanas.  Si no ha sabido [employer] biopsias en el transcurso de 3 semanas, por favor llmenos al [phone].   FIRMAS/CONFIDENCIALIDAD: Usted y/o el acompaante que le cuide han firmado documentos que se ingresarn en su historial mdico Emergency planning/management officer.  Marlowe Sax  el hecho de que la informacin anterior

## 2019-12-01 NOTE — Progress Notes (Signed)
Report given to PACU, vss 

## 2019-12-01 NOTE — Op Note (Signed)
Sara [name] Patient Name: Sara [name] Procedure Date: 12/01/2019 9:18 AM MRN: [id number] Endoscopist: Mauri Pole , MD Age: 74 Referring MD:  Date of Birth: [birth year] Gender: Female Account #: [id number] Procedure:                Colonoscopy Indications:              High risk colon cancer surveillance: Personal                            history of colonic polyps, Surveillance: History of                            numerous (> 10) adenomas on last colonoscopy (< 3                            yrs), High risk colon cancer surveillance: Personal                            history of adenoma (10 mm or greater in size) Medicines:                Monitored Anesthesia Care Procedure:                Pre-Anesthesia Assessment:                           - Prior to the procedure, a History and Physical                            was performed, and patient medications and                            allergies were reviewed. The patient's tolerance of                            previous anesthesia was also reviewed. The risks                            and benefits of the procedure and the sedation                            options and risks were discussed with the patient.                            All questions were answered, and informed consent                            was obtained. Prior Anticoagulants: The patient has                            taken no previous anticoagulant or antiplatelet                            agents. ASA Grade Assessment: III - A patient with  severe systemic disease. After reviewing the risks                            and benefits, the patient was deemed in                            satisfactory condition to undergo the procedure.                           After obtaining informed consent, the colonoscope                            was passed under direct vision. Throughout the                             procedure, the patient's blood pressure, pulse, and                            oxygen saturations were monitored continuously. The                            Colonoscope was introduced through the anus and                            advanced to the the cecum, identified by                            appendiceal orifice and ileocecal valve. The                            colonoscopy was performed without difficulty. The                            patient tolerated the procedure well. The quality                            of the bowel preparation was excellent. The                            ileocecal valve, appendiceal orifice, and rectum                            were photographed. Scope In: 9:29:32 AM Scope Out: 9:52:10 AM Scope Withdrawal Time: 0 hours 15 minutes 21 seconds  Total Procedure Duration: 0 hours 22 minutes 38 seconds  Findings:                 The perianal and digital rectal examinations were                            normal.                           Three semi-pedunculated polyps were found in the  transverse colon. The polyps were 7 to 11 mm in                            size. These polyps were removed with a hot snare.                            Resection and retrieval were complete.                           Three sessile polyps were found in the rectum and                            sigmoid colon. The polyps were 5 to 7 mm in size.                            These polyps were removed with a cold snare.                            Resection and retrieval were complete.                           Four sessile polyps were found in the rectum and                            descending colon. The polyps were 1 to 2 mm in                            size. These polyps were removed with a cold biopsy                            forceps. Resection and retrieval were complete.                           Scattered small and large-mouthed diverticula were                             found in the sigmoid colon, descending colon,                            transverse colon, ascending colon and cecum.                           Non-bleeding internal hemorrhoids were found during                            retroflexion. The hemorrhoids were small. Complications:            No immediate complications. Estimated Blood Loss:     Estimated blood loss was minimal. Impression:               - Three 7 to 11 mm polyps in the transverse colon,  removed with a hot snare. Resected and retrieved.                           - Three 5 to 7 mm polyps in the rectum and in the                            sigmoid colon, removed with a cold snare. Resected                            and retrieved.                           - Four 1 to 2 mm polyps in the rectum and in the                            descending colon, removed with a cold biopsy                            forceps. Resected and retrieved.                           - Diverticulosis in the sigmoid colon, in the                            descending colon, in the transverse colon, in the                            ascending colon and in the cecum.                           - Non-bleeding internal hemorrhoids. Recommendation:           - Patient has a contact number available for                            emergencies. The signs and symptoms of potential                            delayed complications were discussed with the                            patient. Return to normal activities tomorrow.                            Written discharge instructions were provided to the                            patient.                           - Resume previous diet.                           - Continue present medications.                           -  Await pathology results.                           - Repeat colonoscopy in 3 years for surveillance                            based on  pathology results. Mauri Pole, MD 12/01/2019 9:58:54 AM This report has been signed electronically.

## 2019-12-01 NOTE — Progress Notes (Signed)
Temp JB  VS CW   Pt's states no medical or surgical changes since previsit or office visit.  Interpreter used today at the [facility] for this pt.  Interpreter's name is-Claudia

## 2019-12-03 ENCOUNTER — Telehealth: Payer: Self-pay

## 2019-12-03 NOTE — Telephone Encounter (Signed)
  Follow up Call-  Call back number 12/01/2019 10/28/2018  Post procedure Call Back phone  # [phone]  Permission to leave phone message Yes Yes  Some recent data might be hidden     Patient questions:  Do you have a fever, pain , or abdominal swelling? Yes.   Fever of 100.8. one timePain Score  0 *  Have you tolerated food without any problems? Yes.    Have you been able to return to your normal activities? Yes.    Do you have any questions about your discharge instructions: Diet   No. Medications  No. Follow up visit  No.  Do you have questions or concerns about your Care? No.  Actions: * If pain score is 4 or above: No action needed, pain <4.  1. Have you developed a fever since your procedure? Yes, 100.8 one time and no fever since.  2.   Have you had an respiratory symptoms (SOB or cough) since your procedure? no  3.   Have you tested positive for COVID 19 since your procedure no  4.   Have you had any family members/close contacts diagnosed with the COVID 19 since your procedure?  no   If yes to any of these questions please route to Joylene John, RN and Alphonsa Gin, Therapist, sports.

## 2019-12-08 ENCOUNTER — Encounter: Payer: Self-pay | Admitting: Gastroenterology

## 2020-04-27 DIAGNOSIS — H524 Presbyopia: Secondary | ICD-10-CM | POA: Diagnosis not present

## 2020-04-27 DIAGNOSIS — E113493 Type 2 diabetes mellitus with severe nonproliferative diabetic retinopathy without macular edema, bilateral: Secondary | ICD-10-CM | POA: Diagnosis not present

## 2020-07-02 DIAGNOSIS — N289 Disorder of kidney and ureter, unspecified: Secondary | ICD-10-CM | POA: Diagnosis not present

## 2020-07-02 DIAGNOSIS — J9601 Acute respiratory failure with hypoxia: Secondary | ICD-10-CM | POA: Diagnosis not present

## 2020-07-02 DIAGNOSIS — U071 COVID-19: Secondary | ICD-10-CM | POA: Diagnosis not present

## 2020-07-02 DIAGNOSIS — E1165 Type 2 diabetes mellitus with hyperglycemia: Secondary | ICD-10-CM | POA: Diagnosis not present

## 2020-07-03 DIAGNOSIS — E871 Hypo-osmolality and hyponatremia: Secondary | ICD-10-CM | POA: Diagnosis not present

## 2020-07-03 DIAGNOSIS — I10 Essential (primary) hypertension: Secondary | ICD-10-CM | POA: Diagnosis not present

## 2020-07-03 DIAGNOSIS — N289 Disorder of kidney and ureter, unspecified: Secondary | ICD-10-CM | POA: Diagnosis not present

## 2020-07-03 DIAGNOSIS — U071 COVID-19: Secondary | ICD-10-CM | POA: Diagnosis not present

## 2020-07-03 DIAGNOSIS — E1165 Type 2 diabetes mellitus with hyperglycemia: Secondary | ICD-10-CM | POA: Diagnosis not present

## 2020-07-03 DIAGNOSIS — Z79899 Other long term (current) drug therapy: Secondary | ICD-10-CM | POA: Diagnosis not present

## 2020-07-03 DIAGNOSIS — J9601 Acute respiratory failure with hypoxia: Secondary | ICD-10-CM | POA: Diagnosis not present

## 2020-07-03 DIAGNOSIS — J189 Pneumonia, unspecified organism: Secondary | ICD-10-CM | POA: Diagnosis not present

## 2020-07-03 DIAGNOSIS — E039 Hypothyroidism, unspecified: Secondary | ICD-10-CM | POA: Diagnosis not present

## 2020-07-03 DIAGNOSIS — Z7984 Long term (current) use of oral hypoglycemic drugs: Secondary | ICD-10-CM | POA: Diagnosis not present

## 2020-07-03 DIAGNOSIS — R0602 Shortness of breath: Secondary | ICD-10-CM | POA: Diagnosis not present

## 2020-07-03 DIAGNOSIS — N178 Other acute kidney failure: Secondary | ICD-10-CM | POA: Diagnosis not present

## 2020-07-03 DIAGNOSIS — G9341 Metabolic encephalopathy: Secondary | ICD-10-CM | POA: Diagnosis not present

## 2020-07-03 DIAGNOSIS — J029 Acute pharyngitis, unspecified: Secondary | ICD-10-CM | POA: Diagnosis not present

## 2020-07-03 DIAGNOSIS — J1282 Pneumonia due to coronavirus disease 2019: Secondary | ICD-10-CM | POA: Diagnosis not present

## 2020-07-11 DIAGNOSIS — E1165 Type 2 diabetes mellitus with hyperglycemia: Secondary | ICD-10-CM | POA: Diagnosis not present

## 2020-07-11 DIAGNOSIS — I10 Essential (primary) hypertension: Secondary | ICD-10-CM | POA: Diagnosis not present

## 2020-07-11 DIAGNOSIS — E039 Hypothyroidism, unspecified: Secondary | ICD-10-CM | POA: Diagnosis not present

## 2020-07-11 DIAGNOSIS — U071 COVID-19: Secondary | ICD-10-CM | POA: Diagnosis not present

## 2020-07-11 DIAGNOSIS — J1282 Pneumonia due to coronavirus disease 2019: Secondary | ICD-10-CM | POA: Diagnosis not present

## 2020-07-11 DIAGNOSIS — Z794 Long term (current) use of insulin: Secondary | ICD-10-CM | POA: Diagnosis not present

## 2020-07-11 DIAGNOSIS — Z9981 Dependence on supplemental oxygen: Secondary | ICD-10-CM | POA: Diagnosis not present

## 2020-07-11 DIAGNOSIS — J9601 Acute respiratory failure with hypoxia: Secondary | ICD-10-CM | POA: Diagnosis not present

## 2020-07-21 DIAGNOSIS — J9601 Acute respiratory failure with hypoxia: Secondary | ICD-10-CM | POA: Diagnosis not present

## 2020-07-21 DIAGNOSIS — E039 Hypothyroidism, unspecified: Secondary | ICD-10-CM | POA: Diagnosis not present

## 2020-07-21 DIAGNOSIS — I10 Essential (primary) hypertension: Secondary | ICD-10-CM | POA: Diagnosis not present

## 2020-07-21 DIAGNOSIS — U071 COVID-19: Secondary | ICD-10-CM | POA: Diagnosis not present

## 2020-07-21 DIAGNOSIS — Z9981 Dependence on supplemental oxygen: Secondary | ICD-10-CM | POA: Diagnosis not present

## 2020-07-21 DIAGNOSIS — J1282 Pneumonia due to coronavirus disease 2019: Secondary | ICD-10-CM | POA: Diagnosis not present

## 2020-07-21 DIAGNOSIS — Z794 Long term (current) use of insulin: Secondary | ICD-10-CM | POA: Diagnosis not present

## 2020-07-21 DIAGNOSIS — E1165 Type 2 diabetes mellitus with hyperglycemia: Secondary | ICD-10-CM | POA: Diagnosis not present

## 2020-07-27 DIAGNOSIS — E1165 Type 2 diabetes mellitus with hyperglycemia: Secondary | ICD-10-CM | POA: Diagnosis not present

## 2020-07-27 DIAGNOSIS — I10 Essential (primary) hypertension: Secondary | ICD-10-CM | POA: Diagnosis not present

## 2020-07-27 DIAGNOSIS — E039 Hypothyroidism, unspecified: Secondary | ICD-10-CM | POA: Diagnosis not present

## 2020-07-27 DIAGNOSIS — J9601 Acute respiratory failure with hypoxia: Secondary | ICD-10-CM | POA: Diagnosis not present

## 2020-07-27 DIAGNOSIS — Z9981 Dependence on supplemental oxygen: Secondary | ICD-10-CM | POA: Diagnosis not present

## 2020-07-27 DIAGNOSIS — U071 COVID-19: Secondary | ICD-10-CM | POA: Diagnosis not present

## 2020-07-27 DIAGNOSIS — Z794 Long term (current) use of insulin: Secondary | ICD-10-CM | POA: Diagnosis not present

## 2020-07-27 DIAGNOSIS — J1282 Pneumonia due to coronavirus disease 2019: Secondary | ICD-10-CM | POA: Diagnosis not present

## 2020-07-28 DIAGNOSIS — J1282 Pneumonia due to coronavirus disease 2019: Secondary | ICD-10-CM | POA: Diagnosis not present

## 2020-07-28 DIAGNOSIS — J9601 Acute respiratory failure with hypoxia: Secondary | ICD-10-CM | POA: Diagnosis not present

## 2020-07-28 DIAGNOSIS — E1165 Type 2 diabetes mellitus with hyperglycemia: Secondary | ICD-10-CM | POA: Diagnosis not present

## 2020-08-03 DIAGNOSIS — Z794 Long term (current) use of insulin: Secondary | ICD-10-CM | POA: Diagnosis not present

## 2020-08-03 DIAGNOSIS — M7989 Other specified soft tissue disorders: Secondary | ICD-10-CM | POA: Diagnosis not present

## 2020-08-03 DIAGNOSIS — I10 Essential (primary) hypertension: Secondary | ICD-10-CM | POA: Diagnosis not present

## 2020-08-03 DIAGNOSIS — E1165 Type 2 diabetes mellitus with hyperglycemia: Secondary | ICD-10-CM | POA: Diagnosis not present

## 2020-08-03 DIAGNOSIS — Z9981 Dependence on supplemental oxygen: Secondary | ICD-10-CM | POA: Diagnosis not present

## 2020-08-03 DIAGNOSIS — I999 Unspecified disorder of circulatory system: Secondary | ICD-10-CM | POA: Diagnosis not present

## 2020-08-03 DIAGNOSIS — J1282 Pneumonia due to coronavirus disease 2019: Secondary | ICD-10-CM | POA: Diagnosis not present

## 2020-08-03 DIAGNOSIS — J9601 Acute respiratory failure with hypoxia: Secondary | ICD-10-CM | POA: Diagnosis not present

## 2020-08-03 DIAGNOSIS — U071 COVID-19: Secondary | ICD-10-CM | POA: Diagnosis not present

## 2020-08-03 DIAGNOSIS — E039 Hypothyroidism, unspecified: Secondary | ICD-10-CM | POA: Diagnosis not present

## 2020-08-04 DIAGNOSIS — E1165 Type 2 diabetes mellitus with hyperglycemia: Secondary | ICD-10-CM | POA: Diagnosis not present

## 2020-08-04 DIAGNOSIS — N182 Chronic kidney disease, stage 2 (mild): Secondary | ICD-10-CM | POA: Diagnosis not present

## 2020-08-04 DIAGNOSIS — I739 Peripheral vascular disease, unspecified: Secondary | ICD-10-CM | POA: Diagnosis not present

## 2020-08-04 DIAGNOSIS — E782 Mixed hyperlipidemia: Secondary | ICD-10-CM | POA: Diagnosis not present

## 2020-08-04 DIAGNOSIS — E1151 Type 2 diabetes mellitus with diabetic peripheral angiopathy without gangrene: Secondary | ICD-10-CM | POA: Diagnosis not present

## 2020-08-08 DIAGNOSIS — Z9981 Dependence on supplemental oxygen: Secondary | ICD-10-CM | POA: Diagnosis not present

## 2020-08-08 DIAGNOSIS — I10 Essential (primary) hypertension: Secondary | ICD-10-CM | POA: Diagnosis not present

## 2020-08-08 DIAGNOSIS — E039 Hypothyroidism, unspecified: Secondary | ICD-10-CM | POA: Diagnosis not present

## 2020-08-08 DIAGNOSIS — U071 COVID-19: Secondary | ICD-10-CM | POA: Diagnosis not present

## 2020-08-08 DIAGNOSIS — J9601 Acute respiratory failure with hypoxia: Secondary | ICD-10-CM | POA: Diagnosis not present

## 2020-08-08 DIAGNOSIS — Z794 Long term (current) use of insulin: Secondary | ICD-10-CM | POA: Diagnosis not present

## 2020-08-08 DIAGNOSIS — J1282 Pneumonia due to coronavirus disease 2019: Secondary | ICD-10-CM | POA: Diagnosis not present

## 2020-08-08 DIAGNOSIS — E1165 Type 2 diabetes mellitus with hyperglycemia: Secondary | ICD-10-CM | POA: Diagnosis not present

## 2020-09-02 DIAGNOSIS — E113413 Type 2 diabetes mellitus with severe nonproliferative diabetic retinopathy with macular edema, bilateral: Secondary | ICD-10-CM | POA: Diagnosis not present

## 2020-09-07 DIAGNOSIS — E1165 Type 2 diabetes mellitus with hyperglycemia: Secondary | ICD-10-CM | POA: Diagnosis not present

## 2020-09-07 DIAGNOSIS — Z0000 Encounter for general adult medical examination without abnormal findings: Secondary | ICD-10-CM | POA: Diagnosis not present

## 2020-09-07 DIAGNOSIS — Z79899 Other long term (current) drug therapy: Secondary | ICD-10-CM | POA: Diagnosis not present

## 2020-09-07 DIAGNOSIS — E1121 Type 2 diabetes mellitus with diabetic nephropathy: Secondary | ICD-10-CM | POA: Diagnosis not present

## 2020-09-07 DIAGNOSIS — E782 Mixed hyperlipidemia: Secondary | ICD-10-CM | POA: Diagnosis not present

## 2020-09-09 DIAGNOSIS — N182 Chronic kidney disease, stage 2 (mild): Secondary | ICD-10-CM | POA: Diagnosis not present

## 2020-09-09 DIAGNOSIS — I739 Peripheral vascular disease, unspecified: Secondary | ICD-10-CM | POA: Diagnosis not present

## 2020-09-09 DIAGNOSIS — E1165 Type 2 diabetes mellitus with hyperglycemia: Secondary | ICD-10-CM | POA: Diagnosis not present

## 2020-09-09 DIAGNOSIS — E782 Mixed hyperlipidemia: Secondary | ICD-10-CM | POA: Diagnosis not present

## 2020-09-09 DIAGNOSIS — Z794 Long term (current) use of insulin: Secondary | ICD-10-CM | POA: Diagnosis not present

## 2020-09-14 DIAGNOSIS — Z0000 Encounter for general adult medical examination without abnormal findings: Secondary | ICD-10-CM | POA: Diagnosis not present

## 2020-09-14 DIAGNOSIS — E1121 Type 2 diabetes mellitus with diabetic nephropathy: Secondary | ICD-10-CM | POA: Diagnosis not present

## 2020-09-14 DIAGNOSIS — E1169 Type 2 diabetes mellitus with other specified complication: Secondary | ICD-10-CM | POA: Diagnosis not present

## 2020-09-14 DIAGNOSIS — E1165 Type 2 diabetes mellitus with hyperglycemia: Secondary | ICD-10-CM | POA: Diagnosis not present

## 2020-09-14 DIAGNOSIS — E1151 Type 2 diabetes mellitus with diabetic peripheral angiopathy without gangrene: Secondary | ICD-10-CM | POA: Diagnosis not present

## 2020-10-20 DIAGNOSIS — E113413 Type 2 diabetes mellitus with severe nonproliferative diabetic retinopathy with macular edema, bilateral: Secondary | ICD-10-CM | POA: Diagnosis not present

## 2021-02-23 DIAGNOSIS — E113413 Type 2 diabetes mellitus with severe nonproliferative diabetic retinopathy with macular edema, bilateral: Secondary | ICD-10-CM | POA: Diagnosis not present

## 2021-02-27 DIAGNOSIS — E1151 Type 2 diabetes mellitus with diabetic peripheral angiopathy without gangrene: Secondary | ICD-10-CM | POA: Diagnosis not present

## 2021-02-27 DIAGNOSIS — E782 Mixed hyperlipidemia: Secondary | ICD-10-CM | POA: Diagnosis not present

## 2021-02-27 DIAGNOSIS — E1121 Type 2 diabetes mellitus with diabetic nephropathy: Secondary | ICD-10-CM | POA: Diagnosis not present

## 2021-02-27 DIAGNOSIS — I10 Essential (primary) hypertension: Secondary | ICD-10-CM | POA: Diagnosis not present

## 2021-02-27 DIAGNOSIS — E1165 Type 2 diabetes mellitus with hyperglycemia: Secondary | ICD-10-CM | POA: Diagnosis not present

## 2021-02-27 DIAGNOSIS — E1169 Type 2 diabetes mellitus with other specified complication: Secondary | ICD-10-CM | POA: Diagnosis not present

## 2021-02-27 DIAGNOSIS — E039 Hypothyroidism, unspecified: Secondary | ICD-10-CM | POA: Diagnosis not present

## 2021-02-27 DIAGNOSIS — Z0000 Encounter for general adult medical examination without abnormal findings: Secondary | ICD-10-CM | POA: Diagnosis not present

## 2021-02-27 DIAGNOSIS — N182 Chronic kidney disease, stage 2 (mild): Secondary | ICD-10-CM | POA: Diagnosis not present

## 2021-02-27 DIAGNOSIS — I129 Hypertensive chronic kidney disease with stage 1 through stage 4 chronic kidney disease, or unspecified chronic kidney disease: Secondary | ICD-10-CM | POA: Diagnosis not present

## 2021-03-06 DIAGNOSIS — E782 Mixed hyperlipidemia: Secondary | ICD-10-CM | POA: Diagnosis not present

## 2021-03-06 DIAGNOSIS — I10 Essential (primary) hypertension: Secondary | ICD-10-CM | POA: Diagnosis not present

## 2021-03-06 DIAGNOSIS — E1121 Type 2 diabetes mellitus with diabetic nephropathy: Secondary | ICD-10-CM | POA: Diagnosis not present

## 2021-03-06 DIAGNOSIS — E1151 Type 2 diabetes mellitus with diabetic peripheral angiopathy without gangrene: Secondary | ICD-10-CM | POA: Diagnosis not present

## 2021-03-06 DIAGNOSIS — N182 Chronic kidney disease, stage 2 (mild): Secondary | ICD-10-CM | POA: Diagnosis not present

## 2021-03-16 DIAGNOSIS — E1165 Type 2 diabetes mellitus with hyperglycemia: Secondary | ICD-10-CM | POA: Diagnosis not present

## 2021-03-17 DIAGNOSIS — E1165 Type 2 diabetes mellitus with hyperglycemia: Secondary | ICD-10-CM | POA: Diagnosis not present

## 2021-03-21 DIAGNOSIS — D2239 Melanocytic nevi of other parts of face: Secondary | ICD-10-CM | POA: Diagnosis not present

## 2021-03-21 DIAGNOSIS — L918 Other hypertrophic disorders of the skin: Secondary | ICD-10-CM | POA: Diagnosis not present

## 2021-03-21 DIAGNOSIS — D485 Neoplasm of uncertain behavior of skin: Secondary | ICD-10-CM | POA: Diagnosis not present

## 2021-04-16 DIAGNOSIS — E1165 Type 2 diabetes mellitus with hyperglycemia: Secondary | ICD-10-CM | POA: Diagnosis not present

## 2021-04-24 DIAGNOSIS — N182 Chronic kidney disease, stage 2 (mild): Secondary | ICD-10-CM | POA: Diagnosis not present

## 2021-04-24 DIAGNOSIS — Z23 Encounter for immunization: Secondary | ICD-10-CM | POA: Diagnosis not present

## 2021-04-24 DIAGNOSIS — I739 Peripheral vascular disease, unspecified: Secondary | ICD-10-CM | POA: Diagnosis not present

## 2021-04-24 DIAGNOSIS — E039 Hypothyroidism, unspecified: Secondary | ICD-10-CM | POA: Diagnosis not present

## 2021-04-24 DIAGNOSIS — I10 Essential (primary) hypertension: Secondary | ICD-10-CM | POA: Diagnosis not present

## 2021-04-24 DIAGNOSIS — Z794 Long term (current) use of insulin: Secondary | ICD-10-CM | POA: Diagnosis not present

## 2021-04-24 DIAGNOSIS — E782 Mixed hyperlipidemia: Secondary | ICD-10-CM | POA: Diagnosis not present

## 2021-04-24 DIAGNOSIS — I129 Hypertensive chronic kidney disease with stage 1 through stage 4 chronic kidney disease, or unspecified chronic kidney disease: Secondary | ICD-10-CM | POA: Diagnosis not present

## 2021-04-24 DIAGNOSIS — E1165 Type 2 diabetes mellitus with hyperglycemia: Secondary | ICD-10-CM | POA: Diagnosis not present

## 2021-05-16 DIAGNOSIS — E1165 Type 2 diabetes mellitus with hyperglycemia: Secondary | ICD-10-CM | POA: Diagnosis not present

## 2021-06-14 ENCOUNTER — Other Ambulatory Visit (HOSPITAL_BASED_OUTPATIENT_CLINIC_OR_DEPARTMENT_OTHER): Payer: Self-pay | Admitting: Internal Medicine

## 2021-06-14 ENCOUNTER — Other Ambulatory Visit: Payer: Self-pay

## 2021-06-14 ENCOUNTER — Ambulatory Visit (HOSPITAL_BASED_OUTPATIENT_CLINIC_OR_DEPARTMENT_OTHER)
Admission: RE | Admit: 2021-06-14 | Discharge: 2021-06-14 | Disposition: A | Discharge status: Home / Self Care | Payer: Medicare Other | Source: Ambulatory Visit | Attending: Internal Medicine | Admitting: Internal Medicine

## 2021-06-14 DIAGNOSIS — M79661 Pain in right lower leg: Secondary | ICD-10-CM

## 2021-06-14 DIAGNOSIS — M79604 Pain in right leg: Secondary | ICD-10-CM | POA: Diagnosis not present

## 2021-06-14 DIAGNOSIS — I739 Peripheral vascular disease, unspecified: Secondary | ICD-10-CM | POA: Diagnosis not present

## 2021-06-14 DIAGNOSIS — I998 Other disorder of circulatory system: Secondary | ICD-10-CM | POA: Diagnosis not present

## 2021-06-30 DIAGNOSIS — E039 Hypothyroidism, unspecified: Secondary | ICD-10-CM | POA: Diagnosis not present

## 2021-06-30 DIAGNOSIS — E1165 Type 2 diabetes mellitus with hyperglycemia: Secondary | ICD-10-CM | POA: Diagnosis not present

## 2021-06-30 DIAGNOSIS — I129 Hypertensive chronic kidney disease with stage 1 through stage 4 chronic kidney disease, or unspecified chronic kidney disease: Secondary | ICD-10-CM | POA: Diagnosis not present

## 2021-06-30 DIAGNOSIS — E1121 Type 2 diabetes mellitus with diabetic nephropathy: Secondary | ICD-10-CM | POA: Diagnosis not present

## 2021-06-30 DIAGNOSIS — E782 Mixed hyperlipidemia: Secondary | ICD-10-CM | POA: Diagnosis not present

## 2021-09-08 DIAGNOSIS — Z1231 Encounter for screening mammogram for malignant neoplasm of breast: Secondary | ICD-10-CM | POA: Diagnosis not present

## 2021-09-19 DIAGNOSIS — E1151 Type 2 diabetes mellitus with diabetic peripheral angiopathy without gangrene: Secondary | ICD-10-CM | POA: Diagnosis not present

## 2021-09-19 DIAGNOSIS — Z0000 Encounter for general adult medical examination without abnormal findings: Secondary | ICD-10-CM | POA: Diagnosis not present

## 2021-09-19 DIAGNOSIS — I739 Peripheral vascular disease, unspecified: Secondary | ICD-10-CM | POA: Diagnosis not present

## 2021-09-19 DIAGNOSIS — Z794 Long term (current) use of insulin: Secondary | ICD-10-CM | POA: Diagnosis not present

## 2021-09-19 DIAGNOSIS — I129 Hypertensive chronic kidney disease with stage 1 through stage 4 chronic kidney disease, or unspecified chronic kidney disease: Secondary | ICD-10-CM | POA: Diagnosis not present

## 2021-09-19 DIAGNOSIS — E782 Mixed hyperlipidemia: Secondary | ICD-10-CM | POA: Diagnosis not present

## 2021-09-19 DIAGNOSIS — I10 Essential (primary) hypertension: Secondary | ICD-10-CM | POA: Diagnosis not present

## 2021-09-19 DIAGNOSIS — E039 Hypothyroidism, unspecified: Secondary | ICD-10-CM | POA: Diagnosis not present

## 2021-09-19 DIAGNOSIS — N182 Chronic kidney disease, stage 2 (mild): Secondary | ICD-10-CM | POA: Diagnosis not present

## 2021-10-04 DIAGNOSIS — E039 Hypothyroidism, unspecified: Secondary | ICD-10-CM | POA: Diagnosis not present

## 2021-10-04 DIAGNOSIS — E782 Mixed hyperlipidemia: Secondary | ICD-10-CM | POA: Diagnosis not present

## 2021-10-04 DIAGNOSIS — E1165 Type 2 diabetes mellitus with hyperglycemia: Secondary | ICD-10-CM | POA: Diagnosis not present

## 2021-10-04 DIAGNOSIS — I10 Essential (primary) hypertension: Secondary | ICD-10-CM | POA: Diagnosis not present

## 2021-11-12 HISTORY — PX: OTHER SURGICAL HISTORY: SHX169

## 2021-11-16 DIAGNOSIS — H47393 Other disorders of optic disc, bilateral: Secondary | ICD-10-CM | POA: Diagnosis not present

## 2021-11-16 DIAGNOSIS — E113413 Type 2 diabetes mellitus with severe nonproliferative diabetic retinopathy with macular edema, bilateral: Secondary | ICD-10-CM | POA: Diagnosis not present

## 2021-11-22 DIAGNOSIS — H524 Presbyopia: Secondary | ICD-10-CM | POA: Diagnosis not present

## 2022-01-08 DIAGNOSIS — D2239 Melanocytic nevi of other parts of face: Secondary | ICD-10-CM | POA: Diagnosis not present

## 2022-01-08 DIAGNOSIS — D485 Neoplasm of uncertain behavior of skin: Secondary | ICD-10-CM | POA: Diagnosis not present

## 2022-01-29 DIAGNOSIS — E782 Mixed hyperlipidemia: Secondary | ICD-10-CM | POA: Diagnosis not present

## 2022-01-29 DIAGNOSIS — E1151 Type 2 diabetes mellitus with diabetic peripheral angiopathy without gangrene: Secondary | ICD-10-CM | POA: Diagnosis not present

## 2022-01-29 DIAGNOSIS — N182 Chronic kidney disease, stage 2 (mild): Secondary | ICD-10-CM | POA: Diagnosis not present

## 2022-01-29 DIAGNOSIS — Z794 Long term (current) use of insulin: Secondary | ICD-10-CM | POA: Diagnosis not present

## 2022-02-01 DIAGNOSIS — I129 Hypertensive chronic kidney disease with stage 1 through stage 4 chronic kidney disease, or unspecified chronic kidney disease: Secondary | ICD-10-CM | POA: Diagnosis not present

## 2022-02-01 DIAGNOSIS — E039 Hypothyroidism, unspecified: Secondary | ICD-10-CM | POA: Diagnosis not present

## 2022-02-01 DIAGNOSIS — E782 Mixed hyperlipidemia: Secondary | ICD-10-CM | POA: Diagnosis not present

## 2022-02-01 DIAGNOSIS — E1165 Type 2 diabetes mellitus with hyperglycemia: Secondary | ICD-10-CM | POA: Diagnosis not present

## 2022-02-19 DIAGNOSIS — E039 Hypothyroidism, unspecified: Secondary | ICD-10-CM | POA: Diagnosis not present

## 2022-02-19 DIAGNOSIS — E782 Mixed hyperlipidemia: Secondary | ICD-10-CM | POA: Diagnosis not present

## 2022-02-19 DIAGNOSIS — Z23 Encounter for immunization: Secondary | ICD-10-CM | POA: Diagnosis not present

## 2022-02-19 DIAGNOSIS — E1165 Type 2 diabetes mellitus with hyperglycemia: Secondary | ICD-10-CM | POA: Diagnosis not present

## 2022-02-19 DIAGNOSIS — N182 Chronic kidney disease, stage 2 (mild): Secondary | ICD-10-CM | POA: Diagnosis not present

## 2022-02-19 DIAGNOSIS — I739 Peripheral vascular disease, unspecified: Secondary | ICD-10-CM | POA: Diagnosis not present

## 2022-02-19 DIAGNOSIS — I10 Essential (primary) hypertension: Secondary | ICD-10-CM | POA: Diagnosis not present

## 2022-02-19 DIAGNOSIS — Z794 Long term (current) use of insulin: Secondary | ICD-10-CM | POA: Diagnosis not present

## 2022-02-19 DIAGNOSIS — I129 Hypertensive chronic kidney disease with stage 1 through stage 4 chronic kidney disease, or unspecified chronic kidney disease: Secondary | ICD-10-CM | POA: Diagnosis not present

## 2022-02-26 DIAGNOSIS — M7541 Impingement syndrome of right shoulder: Secondary | ICD-10-CM | POA: Diagnosis not present

## 2022-02-26 DIAGNOSIS — M25511 Pain in right shoulder: Secondary | ICD-10-CM | POA: Diagnosis not present

## 2022-03-02 DIAGNOSIS — D225 Melanocytic nevi of trunk: Secondary | ICD-10-CM | POA: Diagnosis not present

## 2022-03-02 DIAGNOSIS — L821 Other seborrheic keratosis: Secondary | ICD-10-CM | POA: Diagnosis not present

## 2022-03-02 DIAGNOSIS — D485 Neoplasm of uncertain behavior of skin: Secondary | ICD-10-CM | POA: Diagnosis not present

## 2022-03-02 DIAGNOSIS — D2239 Melanocytic nevi of other parts of face: Secondary | ICD-10-CM | POA: Diagnosis not present

## 2022-03-02 DIAGNOSIS — L719 Rosacea, unspecified: Secondary | ICD-10-CM | POA: Diagnosis not present

## 2022-03-07 DIAGNOSIS — M25511 Pain in right shoulder: Secondary | ICD-10-CM | POA: Diagnosis not present

## 2022-03-07 DIAGNOSIS — R531 Weakness: Secondary | ICD-10-CM | POA: Diagnosis not present

## 2022-03-14 DIAGNOSIS — M25511 Pain in right shoulder: Secondary | ICD-10-CM | POA: Diagnosis not present

## 2022-03-14 DIAGNOSIS — R531 Weakness: Secondary | ICD-10-CM | POA: Diagnosis not present

## 2022-03-15 DIAGNOSIS — E113413 Type 2 diabetes mellitus with severe nonproliferative diabetic retinopathy with macular edema, bilateral: Secondary | ICD-10-CM | POA: Diagnosis not present

## 2022-03-15 DIAGNOSIS — H47393 Other disorders of optic disc, bilateral: Secondary | ICD-10-CM | POA: Diagnosis not present

## 2022-03-21 DIAGNOSIS — M25511 Pain in right shoulder: Secondary | ICD-10-CM | POA: Diagnosis not present

## 2022-03-21 DIAGNOSIS — R531 Weakness: Secondary | ICD-10-CM | POA: Diagnosis not present

## 2022-03-22 DIAGNOSIS — E1151 Type 2 diabetes mellitus with diabetic peripheral angiopathy without gangrene: Secondary | ICD-10-CM | POA: Diagnosis not present

## 2022-03-22 DIAGNOSIS — E782 Mixed hyperlipidemia: Secondary | ICD-10-CM | POA: Diagnosis not present

## 2022-03-22 DIAGNOSIS — H524 Presbyopia: Secondary | ICD-10-CM | POA: Diagnosis not present

## 2022-03-22 DIAGNOSIS — I129 Hypertensive chronic kidney disease with stage 1 through stage 4 chronic kidney disease, or unspecified chronic kidney disease: Secondary | ICD-10-CM | POA: Diagnosis not present

## 2022-03-22 DIAGNOSIS — H34232 Retinal artery branch occlusion, left eye: Secondary | ICD-10-CM | POA: Diagnosis not present

## 2022-03-23 DIAGNOSIS — M25511 Pain in right shoulder: Secondary | ICD-10-CM | POA: Diagnosis not present

## 2022-03-23 DIAGNOSIS — R531 Weakness: Secondary | ICD-10-CM | POA: Diagnosis not present

## 2022-03-28 DIAGNOSIS — H34232 Retinal artery branch occlusion, left eye: Secondary | ICD-10-CM | POA: Diagnosis not present

## 2022-03-28 DIAGNOSIS — M25511 Pain in right shoulder: Secondary | ICD-10-CM | POA: Diagnosis not present

## 2022-03-28 DIAGNOSIS — R9082 White matter disease, unspecified: Secondary | ICD-10-CM | POA: Diagnosis not present

## 2022-03-28 DIAGNOSIS — R531 Weakness: Secondary | ICD-10-CM | POA: Diagnosis not present

## 2022-03-28 DIAGNOSIS — G319 Degenerative disease of nervous system, unspecified: Secondary | ICD-10-CM | POA: Diagnosis not present

## 2022-03-28 DIAGNOSIS — Z8673 Personal history of transient ischemic attack (TIA), and cerebral infarction without residual deficits: Secondary | ICD-10-CM | POA: Diagnosis not present

## 2022-03-28 DIAGNOSIS — H5462 Unqualified visual loss, left eye, normal vision right eye: Secondary | ICD-10-CM | POA: Diagnosis not present

## 2022-03-28 DIAGNOSIS — I639 Cerebral infarction, unspecified: Secondary | ICD-10-CM | POA: Diagnosis not present

## 2022-03-29 DIAGNOSIS — E782 Mixed hyperlipidemia: Secondary | ICD-10-CM | POA: Diagnosis not present

## 2022-03-29 DIAGNOSIS — H34232 Retinal artery branch occlusion, left eye: Secondary | ICD-10-CM | POA: Diagnosis not present

## 2022-03-29 DIAGNOSIS — I10 Essential (primary) hypertension: Secondary | ICD-10-CM | POA: Diagnosis not present

## 2022-03-29 DIAGNOSIS — I6523 Occlusion and stenosis of bilateral carotid arteries: Secondary | ICD-10-CM | POA: Diagnosis not present

## 2022-03-29 DIAGNOSIS — I129 Hypertensive chronic kidney disease with stage 1 through stage 4 chronic kidney disease, or unspecified chronic kidney disease: Secondary | ICD-10-CM | POA: Diagnosis not present

## 2022-03-29 DIAGNOSIS — E1165 Type 2 diabetes mellitus with hyperglycemia: Secondary | ICD-10-CM | POA: Diagnosis not present

## 2022-03-29 DIAGNOSIS — Z794 Long term (current) use of insulin: Secondary | ICD-10-CM | POA: Diagnosis not present

## 2022-03-29 DIAGNOSIS — H3402 Transient retinal artery occlusion, left eye: Secondary | ICD-10-CM | POA: Diagnosis not present

## 2022-03-29 DIAGNOSIS — I639 Cerebral infarction, unspecified: Secondary | ICD-10-CM | POA: Diagnosis not present

## 2022-04-04 DIAGNOSIS — R531 Weakness: Secondary | ICD-10-CM | POA: Diagnosis not present

## 2022-04-04 DIAGNOSIS — M25511 Pain in right shoulder: Secondary | ICD-10-CM | POA: Diagnosis not present

## 2022-04-11 DIAGNOSIS — R531 Weakness: Secondary | ICD-10-CM | POA: Diagnosis not present

## 2022-04-11 DIAGNOSIS — M25511 Pain in right shoulder: Secondary | ICD-10-CM | POA: Diagnosis not present

## 2022-04-19 DIAGNOSIS — I6782 Cerebral ischemia: Secondary | ICD-10-CM | POA: Diagnosis not present

## 2022-04-19 DIAGNOSIS — I471 Supraventricular tachycardia: Secondary | ICD-10-CM | POA: Diagnosis not present

## 2022-04-23 DIAGNOSIS — I6782 Cerebral ischemia: Secondary | ICD-10-CM | POA: Diagnosis not present

## 2022-04-23 DIAGNOSIS — E782 Mixed hyperlipidemia: Secondary | ICD-10-CM | POA: Diagnosis not present

## 2022-04-23 DIAGNOSIS — H34232 Retinal artery branch occlusion, left eye: Secondary | ICD-10-CM | POA: Diagnosis not present

## 2022-04-25 ENCOUNTER — Other Ambulatory Visit: Payer: Self-pay

## 2022-04-25 DIAGNOSIS — I129 Hypertensive chronic kidney disease with stage 1 through stage 4 chronic kidney disease, or unspecified chronic kidney disease: Secondary | ICD-10-CM | POA: Insufficient documentation

## 2022-04-25 DIAGNOSIS — E119 Type 2 diabetes mellitus without complications: Secondary | ICD-10-CM | POA: Insufficient documentation

## 2022-04-25 DIAGNOSIS — E1165 Type 2 diabetes mellitus with hyperglycemia: Secondary | ICD-10-CM | POA: Insufficient documentation

## 2022-04-25 DIAGNOSIS — E039 Hypothyroidism, unspecified: Secondary | ICD-10-CM | POA: Insufficient documentation

## 2022-04-25 DIAGNOSIS — D649 Anemia, unspecified: Secondary | ICD-10-CM | POA: Insufficient documentation

## 2022-04-25 DIAGNOSIS — N182 Chronic kidney disease, stage 2 (mild): Secondary | ICD-10-CM | POA: Insufficient documentation

## 2022-04-25 DIAGNOSIS — I739 Peripheral vascular disease, unspecified: Secondary | ICD-10-CM | POA: Insufficient documentation

## 2022-04-25 DIAGNOSIS — E782 Mixed hyperlipidemia: Secondary | ICD-10-CM | POA: Insufficient documentation

## 2022-04-25 DIAGNOSIS — Z794 Long term (current) use of insulin: Secondary | ICD-10-CM | POA: Insufficient documentation

## 2022-04-25 DIAGNOSIS — I10 Essential (primary) hypertension: Secondary | ICD-10-CM | POA: Insufficient documentation

## 2022-04-25 HISTORY — DX: Essential (primary) hypertension: I10

## 2022-04-26 DIAGNOSIS — S52514S Nondisplaced fracture of right radial styloid process, sequela: Secondary | ICD-10-CM | POA: Diagnosis not present

## 2022-04-27 DIAGNOSIS — L719 Rosacea, unspecified: Secondary | ICD-10-CM | POA: Diagnosis not present

## 2022-05-04 DIAGNOSIS — M7989 Other specified soft tissue disorders: Secondary | ICD-10-CM | POA: Diagnosis not present

## 2022-05-04 DIAGNOSIS — M75101 Unspecified rotator cuff tear or rupture of right shoulder, not specified as traumatic: Secondary | ICD-10-CM | POA: Diagnosis not present

## 2022-05-04 DIAGNOSIS — M19011 Primary osteoarthritis, right shoulder: Secondary | ICD-10-CM | POA: Diagnosis not present

## 2022-05-09 DIAGNOSIS — M7541 Impingement syndrome of right shoulder: Secondary | ICD-10-CM | POA: Diagnosis not present

## 2022-05-21 DIAGNOSIS — M7541 Impingement syndrome of right shoulder: Secondary | ICD-10-CM | POA: Diagnosis not present

## 2022-05-21 DIAGNOSIS — M25511 Pain in right shoulder: Secondary | ICD-10-CM | POA: Diagnosis not present

## 2022-05-21 DIAGNOSIS — M62521 Muscle wasting and atrophy, not elsewhere classified, right upper arm: Secondary | ICD-10-CM | POA: Diagnosis not present

## 2022-05-23 DIAGNOSIS — M7541 Impingement syndrome of right shoulder: Secondary | ICD-10-CM | POA: Diagnosis not present

## 2022-05-23 DIAGNOSIS — M62521 Muscle wasting and atrophy, not elsewhere classified, right upper arm: Secondary | ICD-10-CM | POA: Diagnosis not present

## 2022-05-23 DIAGNOSIS — M25511 Pain in right shoulder: Secondary | ICD-10-CM | POA: Diagnosis not present

## 2022-05-24 ENCOUNTER — Encounter: Payer: Self-pay | Admitting: Cardiology

## 2022-05-24 ENCOUNTER — Ambulatory Visit: Payer: Medicare Other | Admitting: Cardiology

## 2022-05-24 VITALS — BP 116/67 | HR 69 | Ht 62.0 in | Wt 208.4 lb

## 2022-05-24 DIAGNOSIS — Z780 Asymptomatic menopausal state: Secondary | ICD-10-CM | POA: Insufficient documentation

## 2022-05-24 DIAGNOSIS — I639 Cerebral infarction, unspecified: Secondary | ICD-10-CM | POA: Insufficient documentation

## 2022-05-24 DIAGNOSIS — E113413 Type 2 diabetes mellitus with severe nonproliferative diabetic retinopathy with macular edema, bilateral: Secondary | ICD-10-CM | POA: Insufficient documentation

## 2022-05-24 DIAGNOSIS — E1121 Type 2 diabetes mellitus with diabetic nephropathy: Secondary | ICD-10-CM

## 2022-05-24 DIAGNOSIS — R0609 Other forms of dyspnea: Secondary | ICD-10-CM | POA: Insufficient documentation

## 2022-05-24 DIAGNOSIS — E1169 Type 2 diabetes mellitus with other specified complication: Secondary | ICD-10-CM

## 2022-05-24 DIAGNOSIS — R011 Cardiac murmur, unspecified: Secondary | ICD-10-CM

## 2022-05-24 DIAGNOSIS — N182 Chronic kidney disease, stage 2 (mild): Secondary | ICD-10-CM | POA: Insufficient documentation

## 2022-05-24 DIAGNOSIS — E1165 Type 2 diabetes mellitus with hyperglycemia: Secondary | ICD-10-CM

## 2022-05-24 DIAGNOSIS — E669 Obesity, unspecified: Secondary | ICD-10-CM

## 2022-05-24 DIAGNOSIS — E782 Mixed hyperlipidemia: Secondary | ICD-10-CM | POA: Diagnosis not present

## 2022-05-24 DIAGNOSIS — E1151 Type 2 diabetes mellitus with diabetic peripheral angiopathy without gangrene: Secondary | ICD-10-CM

## 2022-05-24 DIAGNOSIS — H3400 Transient retinal artery occlusion, unspecified eye: Secondary | ICD-10-CM | POA: Insufficient documentation

## 2022-05-24 DIAGNOSIS — I10 Essential (primary) hypertension: Secondary | ICD-10-CM | POA: Diagnosis not present

## 2022-05-24 DIAGNOSIS — I129 Hypertensive chronic kidney disease with stage 1 through stage 4 chronic kidney disease, or unspecified chronic kidney disease: Secondary | ICD-10-CM | POA: Insufficient documentation

## 2022-05-24 DIAGNOSIS — H34239 Retinal artery branch occlusion, unspecified eye: Secondary | ICD-10-CM | POA: Insufficient documentation

## 2022-05-24 HISTORY — DX: Asymptomatic menopausal state: Z78.0

## 2022-05-24 HISTORY — DX: Type 2 diabetes mellitus with diabetic peripheral angiopathy without gangrene: E11.51

## 2022-05-24 HISTORY — DX: Cerebral infarction, unspecified: I63.9

## 2022-05-24 HISTORY — DX: Type 2 diabetes mellitus with other specified complication: E11.69

## 2022-05-24 HISTORY — DX: Chronic kidney disease, stage 2 (mild): N18.2

## 2022-05-24 HISTORY — DX: Cardiac murmur, unspecified: R01.1

## 2022-05-24 HISTORY — DX: Other forms of dyspnea: R06.09

## 2022-05-24 HISTORY — DX: Hypertensive chronic kidney disease with stage 1 through stage 4 chronic kidney disease, or unspecified chronic kidney disease: I12.9

## 2022-05-24 HISTORY — DX: Type 2 diabetes mellitus with diabetic nephropathy: E11.21

## 2022-05-24 HISTORY — DX: Transient retinal artery occlusion, unspecified eye: H34.00

## 2022-05-24 HISTORY — DX: Obesity, unspecified: E66.9

## 2022-05-24 HISTORY — DX: Type 2 diabetes mellitus with hyperglycemia: E11.65

## 2022-05-24 HISTORY — DX: Type 2 diabetes mellitus with severe nonproliferative diabetic retinopathy with macular edema, bilateral: E11.3413

## 2022-05-24 HISTORY — DX: Retinal artery branch occlusion, unspecified eye: H34.239

## 2022-05-24 NOTE — Patient Instructions (Signed)
Instrucciones de medicamentos: Su mdico recomienda que contine con sus medicamentos actuales segn las indicaciones. Consulte la lista de medicamentos actuales que se le entreg hoy. *Si necesita un resurtido de sus medicamentos cardacos antes de su prxima cita, llame a su farmacia*   Trabajo de laboratorio: Ninguno ordenado Si se le realizaron anlisis de laboratorio (anlisis de [place]) hoy y sus pruebas son completamente normales, recibir sus resultados solo cuando:  Mensaje de MyChart (si tiene MyChart) Merla Riches copia en papel por correo Si tiene alguna prueba de laboratorio que es anormal o necesitamos cambiar su tratamiento, lo llamaremos para revisar los [place].   Pruebas/Procedimientos: Su mdico ha solicitado que se Oceanographer. La ecocardiografa es una prueba indolora que [place] ondas de sonido para crear imgenes de su corazn. Le brinda a su mdico informacin sobre el tamao y la forma de su corazn y qu tan bien estn funcionando las cmaras y vlvulas de su corazn. Este procedimiento dura aproximadamente una hora. No hay restricciones para este procedimiento.   Su mdico ha solicitado que se Financial risk analyst. Para obtener ms informacin, visite [email or web address]. Siga la hoja de instrucciones, tal como se indica.  La prueba tardar aproximadamente de 3 a 4 horas en completarse; puede traer material de lectura. Si alguien lo acompaa a su cita, Equities trader en el vestbulo principal debido al espacio limitado en el rea de prueba.   Cmo prepararse para su prueba de perfusin miocrdica:  No coma ni beba 3 horas antes de su prueba, excepto que tenga agua.  No consuma productos que contengan cafena (regular o descafeinada) 12 horas antes de su prueba. (Ej: caf, chocolate, refrescos, t).  Do bring a list of your current medications with you.  If not listed below, you may take your medications as normal. "Traiga una lista de sus medicamentos  actuales. Si no se encuentran en la lista a continuacin, puede tomar sus medicamentos con normalidad".  Use ropa cmoda (sin vestidos ni overoles) y zapatos para caminar, preferiblemente tenis (no se permiten tacones ni zapatos abiertos).  NO use colonia, perfume, locin para despus del afeitado o lociones (se permite el desodorante).  Si no se siguen estas instrucciones, su prueba tendr que ser reprogramada.    Hacer un seguimiento: En [facility], usted y sus necesidades de salud son Cleotis Nipper prioridad. Como parte de nuestra misin continua de brindarle una atencin cardaca excepcional, hemos creado equipos de atencin de proveedores designados. Estos equipos de atencin incluyen a su cardilogo primario (mdico) y proveedores de Financial planner (APP, asistentes mdicos y enfermeras practicantes) que trabajan juntos para brindarle la atencin que necesita, cuando la necesita.  Recomendamos registrarse en el portal del paciente llamado "MyChart". La informacin de registro se proporciona en este Resumen posterior a la visita. MyChart se [place] para conectarse con pacientes para visitas virtuales (telemedicina). Los [employer] de laboratorio/pruebas, notas de encuentros, prximas citas, etc. Tambin se pueden enviar mensajes no urgentes a su proveedor. Para obtener ms informacin sobre lo que [employer] con MyChart, vaya a [email or web address].  Su prxima cita: 3 meses  El formato para su prxima cita: En persona  Proveedor: Dr. Sunny Schlein Revankar   Otras instrucciones Gammagrafa cardaca Cardiac Nuclear Scan Dannielle Karvonen cardaca es una prueba que mide el flujo de sangre al corazn cuando una persona est en reposo y cuando ejercita. La prueba puede detectar si: No llega suficiente sangre a una regin del corazn. El msculo del corazn no funciona normalmente. Puede  ser [employer] le hagan esta prueba si: Wilma Flavin enfermedad cardaca. Ha tenido  resultados de laboratorio anormales. Ha tenido una ciruga cardaca o un procedimiento de baln para abrir las arterias obstruidas (angioplastia). Siente dolor en el pecho. Le falta el aire. Para este estudio, se inyecta un tinte radiactivo Futures trader) en el torrente sanguneo. Despus de que el marcador se haya dirigido al corazn, se [place] un dispositivo de obtencin de imgenes para medir la cantidad de Geneticist, molecular que es absorbida por las diferentes zonas del corazn o que se distribuye en ellas. [place], este procedimiento se realiza en un hospital y [place] 2 y 4 horas. Informe al mdico acerca de lo siguiente: Cualquier alergia que tenga. Todos los [employer] [place], incluidos vitaminas, hierbas, gotas oftlmicas, cremas y medicamentos de venta libre. Problemas previos que usted o algn miembro de su familia hayan tenido con los anestsicos. Cualquier trastorno de la sangre que tenga. Cirugas a las que se haya sometido. Cualquier afeccin mdica que tenga. Si est embarazada o podra estarlo. Cules son los riesgos? En general, se trata de un procedimiento seguro. Sin embargo, pueden ocurrir complicaciones, por ejemplo: Dolor intenso en el pecho e infarto de miocardio. Esto es solo un riesgo si se realiza la parte de la prueba de esfuerzo del procedimiento. Latidos cardacos rpidos. Sensacin de Technical brewer. Esta suele desaparecer rpidamente. Una reaccin alrgica al [employer]. Qu ocurre antes del procedimiento? Consulte al mdico si debe cambiar o suspender los medicamentos que toma habitualmente. Esto es muy importante si toma medicamentos para la diabetes o anticoagulantes. Siga las indicaciones del mdico respecto de las restricciones en las comidas o las bebidas. [place] del procedimiento. Qu ocurre durante el procedimiento? Le colocarn una va intravenosa en una vena. El mdico le inyectar una pequea cantidad de Geneticist, molecular radioactivo a travs  de la va i.v. Usted esperar durante 20 a 40 minutos mientras el marcador viaja por el torrente sanguneo. Se supervisar la actividad del corazn con un electrocardiograma (ECG). Se recostar en una camilla. Se tomarn imgenes del corazn durante un lapso de 15 a 20 minutos. Tambin pueden hacerle Art therapist. Para esta prueba, se podr hacer una de las siguientes opciones: Deber ejercitarse sobre una cinta caminadora o una bicicleta fija. Mientras hace ejercicio, se controlar la actividad del corazn con un ECG, y se le tomar la presin arterial. Le administrarn medicamentos que aumentarn el flujo de sangre a partes del corazn. Esto se realiza si no puede hacer ejercicio. Cuando el corazn reciba el flujo mximo de [place], se volver a Environmental consultant a travs de la va i.v. Despus de entre 20 y 40 minutos, regresar a la camilla y se tomarn ms imgenes del Training and development officer. Dependiendo del tipo de marcador [place], puede ser necesario repetir el procedimiento entre 3 y 4 horas ms tarde. Cuando el procedimiento haya terminado, se retirar la va i.v. El procedimiento puede variar segn el mdico y el hospital. Sander Nephew ocurre despus del procedimiento? Puede retomar su rutina normal, incluidos la dieta, las actividades y [employer], a menos que el mdico le indique lo contrario. A menos que su mdico le indique lo contrario, puede aumentar el consumo de lquidos. Esto ayudar a eliminar el tinte de contraste del cuerpo. Beba suficiente lquido como para Theatre manager la orina de color amarillo plido. Consulte a su mdico o pregunte en el departamento donde se realiza el anlisis acerca de lo siguiente: Cundo estarn disponibles mis resultados? Cmo obtendr mis  resultados? Resumen Una gammagrafa cardaca mide el flujo de sangre al corazn cuando una persona est en reposo y cuando ejercita. Infrmele al mdico si est embarazada. Antes del procedimiento, hable con el mdico  acerca de cambiar o suspender los medicamentos que toma habitualmente. Esto es muy importante si toma medicamentos para la diabetes o anticoagulantes. Despus del procedimiento, a menos que su mdico le indique lo contrario, aumente el consumo de lquidos. Esto ayudar a eliminar el tinte de contraste del cuerpo. Despus del procedimiento, puede retomar su rutina normal, incluidos la dieta, las actividades y [employer], a menos que el mdico le indique lo contrario. Esta informacin no tiene Marine scientist el consejo del mdico. Asegrese de hacerle al mdico cualquier pregunta que tenga. Document Revised: 04/17/2021 Document Reviewed: 04/17/2021 Elsevier Patient Education  [place] Echocardiogram Un ecocardiograma es una prueba que [place] ondas sonoras (ecografa) para obtener imgenes del corazn. Las imgenes de un ecocardiograma pueden proporcionar informacin importante sobre lo siguiente: Tamao y forma del corazn. [place], el grosor y el movimiento de las paredes del Training and development officer. La funcin y la fuerza del msculo cardaco. La funcin de la vlvula cardaca o si tiene estenosis. Se presenta estenosis cuando las vlvulas cardacas son demasiado estrechas. Si la sangre [employer] atrs a travs de las vlvulas cardacas (regurgitacin). Un tumor o crecimiento infeccioso alrededor de las vlvulas cardacas. reas del msculo cardaco que no funcionan bien debido a un flujo sanguneo deficiente o a una lesin a causa de un infarto de miocardio. Signos de aneurisma. Un aneurisma es una parte debilitada o daada en la pared de una arteria. La pared se abulta debido a la fuerza normal que produce el bombeo de la sangre a travs del cuerpo. Informe al mdico acerca de lo siguiente: Cualquier alergia que tenga. Todos los [employer] [place], incluidos vitaminas, hierbas, gotas oftlmicas, cremas y medicamentos de venta libre. Cualquier trastorno de la sangre  que tenga. Cirugas a las que se haya sometido. Cualquier afeccin mdica que tenga. Si est embarazada o podra estarlo. Cules son los riesgos? Por lo general, se trata de una prueba segura. Sin embargo, pueden presentarse problemas, como una reaccin alrgica al tinte IT consultant) que se puede usar durante la prueba. Qu ocurre antes de esta prueba? No se requiere Associate Professor. Podr comer y beber normalmente. Qu ocurre durante la prueba?  Se quitar la ropa de la cintura para [place] y se pondr una bata de hospital. Es posible que le coloquen electrodos oparches de electrocardiograma (ECG) en el pecho. Luego los electrodos o parches se conectan a un dispositivo que controla su ritmo y frecuencia cardaca. Se acostar sobre una mesa para que le realicen una ecografa. Se le aplicar un gel en el pecho para ayudar a que las ondas sonoras pasen a travs de la piel. Se presionar un dispositivo de mano, llamado transductor, contra el pecho y se mover sobre su corazn. El transductor produce ondas sonoras que viajan hasta el corazn y rebotan (o producen "eco") Designer, jewellery. Estas ondas sonoras se captarn en tiempo real y se transformarn en imgenes del corazn que se pueden ver en un monitor de vdeo. Las imgenes se grabarn en una computadora y sern revisadas por el mdico. Podrn solicitarle que cambie de posicin o que contenga la respiracin durante un lapso breve. Esto hace que sea ms fcil obtener diferentes vistas o mejores vistas del corazn. En algunos casos, puede recibir [place] a travs de [place]  va intravenosa (i.v). en una de las venas. Esto puede mejorar la calidad de las imgenes del corazn. El procedimiento puede variar segn el mdico y el hospital. Qu puedo esperar despus de la prueba? Puede retomar su rutina diaria normal, incluidos la dieta, las actividades y [employer], a menos que su mdico le indique lo contrario. Siga estas  instrucciones en su casa: Recoger los [employer] de la prueba es su responsabilidad. Consulte al mdico o pregunte en el departamento donde se realiza la prueba cundo estarn [employer]. Cumpla con todas las visitas de seguimiento. Esto es importante. Resumen Un ecocardiograma es una prueba que [place] ondas sonoras (ecografa) para obtener imgenes del corazn. Las [employer] de un ecocardiograma pueden brindar informacin importante sobre el tamao y la forma del corazn, la funcin del msculo cardaco, la funcin de la vlvula cardaca y otros posibles problemas cardacos. No es necesario prepararse para esta prueba. Podr comer y beber normalmente. Al finalizar el ecocardiograma, puede retomar su rutina diaria normal, a menos que su mdico le indique lo contrario. Esta informacin no tiene Marine scientist el consejo del mdico. Asegrese de hacerle al mdico cualquier pregunta que tenga. Document Revised: 08/25/2020 Document Reviewed: 08/25/2020 Elsevier Patient Education  [place].     Medication Instructions:  Your physician recommends that you continue on your current medications as directed. Please refer to the Current Medication list given to you today.  *If you need a refill on your cardiac medications before your next appointment, please call your pharmacy*   Lab Work: None ordered If you have labs (blood work) drawn today and your tests are completely normal, you will receive your results only by: [place] (if you have MyChart) OR A paper copy in the mail If you have any lab test that is abnormal or we need to change your treatment, we will call you to review the results.   Testing/Procedures: Your physician has requested that you have a lexiscan myoview. For further information please visit [email or web address]. Please follow instruction sheet, as given.  The test will take approximately 3 to 4 hours to complete; you may bring reading material.   If someone comes with you to your appointment, they will need to remain in the main lobby due to limited space in the testing area.  How to prepare for your Myocardial Perfusion Test: Do not eat or drink 3 hours prior to your test, except you may have water. Do not consume products containing caffeine (regular or decaffeinated) 12 hours prior to your test. (ex: coffee, chocolate, sodas, tea). Do bring a list of your current medications with you.  If not listed below, you may take your medications as normal. Do wear comfortable clothes (no dresses or overalls) and walking shoes, tennis shoes preferred (No heels or open toe shoes are allowed). Do NOT wear cologne, perfume, aftershave, or lotions (deodorant is allowed). If these instructions are not followed, your test will have to be rescheduled.   Your physician has requested that you have an echocardiogram. Echocardiography is a painless test that uses sound waves to create images of your heart. It provides your doctor with information about the size and shape of your heart and how well your heart's chambers and valves are working. This procedure takes approximately one hour. There are no restrictions for this procedure.   Follow-Up: At [facility], you and your health needs are our priority.  As part of our continuing mission to provide you with exceptional heart care,  we have created designated Provider Care Teams.  These Care Teams include your primary Cardiologist (physician) and Advanced Practice Providers (APPs -  Physician Assistants and Nurse Practitioners) who all work together to provide you with the care you need, when you need it.  We recommend signing up for the patient portal called "MyChart".  Sign up information is provided on this After Visit Summary.  MyChart is used to connect with patients for Virtual Visits (Telemedicine).  Patients are able to view lab/test results, encounter notes, upcoming appointments, etc.  Non-urgent  messages can be sent to your provider as well.   To learn more about what you can do with MyChart, go to [email or web address].    Your next appointment:   12 month(s)  The format for your next appointment:   In Person  Provider:   Jyl Heinz, MD   Other Instructions NA

## 2022-05-24 NOTE — Progress Notes (Signed)
Cardiology Office Note:    Date:  05/24/2022   ID:  Sara [name], DOB [birth year], MRN [id number]  PCP:  Merrilee Seashore, MD  Cardiologist:  Jenean Lindau, MD   Referring MD: Merrilee Seashore, MD    ASSESSMENT:    1. Essential hypertension   2. Mixed hyperlipidemia   3. Type 2 diabetes mellitus with peripheral angiopathy (Sara [name])   4. DOE (dyspnea on exertion)   5. Obesity (BMI 35.0-39.9 without comorbidity)   6. Cardiac murmur    PLAN:    In order of problems listed above:  Primary prevention stressed with the patient.  Importance of compliance with diet medication stressed and she vocalized understanding. Dyspnea on exertion: She leads a sedentary lifestyle and has multiple risk factors so we will do a Lexiscan sestamibi.  This will help [place] rule out obstructive coronary artery disease in view of multiple risk factors. Essential hypertension: Blood pressure stable and diet was emphasized. Mixed dyslipidemia, diabetes mellitus and obesity: Lifestyle modification urged she was advised to be ambulatory and lose weight.  I told her to get test related exercise program once she has the results of stress test and she agrees. Cardiac murmur: Echocardiogram will be done to assess murmur heard on auscultation. Overall monitoring appears unremarkable and I reassured her about my findings. Patient will be seen in follow-up appointment in 6 months or earlier if the patient has any concerns    Medication Adjustments/Labs and Tests Ordered: Current medicines are reviewed at length with the patient today.  Concerns regarding medicines are outlined above.  No orders of the defined types were placed in this encounter.  No orders of the defined types were placed in this encounter.    History of Present Illness:    Sara [name] is a 76 y.o. female who is being seen today for the evaluation of dyspnea on exertion and abnormal monitoring.  At the request of Merrilee Seashore, MD. patient has past medical history of essential hypertension, dyslipidemia, diabetes mellitus and obesity.  Overall she leads a sedentary lifestyle.  She gives history of dyspnea on exertion.  Upon monitoring she was found to have atrial tachycardia.  I do not have the complete report and I am waiting for the fax.  At the time of my evaluation, the patient is alert awake oriented and in no distress.  The interpretation was done by an official interpreter.  Past Medical History:  Diagnosis Date   Anemia    Cataract 2016   bilateral cataracts with lensectomy and lens implant   Chronic renal insufficiency, stage II (mild)    DM (diabetes mellitus) ([place])    HTN (hypertension)    Hypertension with renal disease    Hypothyroidism    Long term (current) use of insulin (Sara [name])    Mixed hyperlipidemia    Morbid obesity due to excess calories (Sara [name])    PVD (peripheral vascular disease) (Sara [name])    Type 2 diabetes mellitus with hyperglycemia (Sara [name])     Past Surgical History:  Procedure Laterality Date   APPENDECTOMY     CATARACT EXTRACTION, BILATERAL     CESAREAN SECTION     TUBAL LIGATION      Current Medications: Current Meds  Medication Sig   amLODipine (NORVASC) 10 MG tablet Take 10 mg by mouth daily.   atorvastatin (LIPITOR) 20 MG tablet Take 20 mg by mouth daily.   CALCIUM PO Take 2 tablets by mouth daily.   EUTHYROX 50 MCG tablet Take  50 mcg by mouth daily.   JARDIANCE 10 MG TABS tablet Take 10 mg by mouth daily.   losartan-hydrochlorothiazide (HYZAAR) 50-12.5 MG tablet Take 1 tablet by mouth daily.   metFORMIN (GLUCOPHAGE) 1000 MG tablet Take 1,000 mg by mouth daily with breakfast.   NOVOLOG MIX 70/30 FLEXPEN (70-30) 100 UNIT/ML FlexPen INJECT 25 UNITS SUBCUTANEOUSLY IN THE MORNING THEN INJECT 5 UNITS AT LUNCH AND 40 UNITS IN THE EVENING. TITRATE TO MMD 100 UNITS     Allergies:   Patient has no known allergies.   Social History   Socioeconomic History   Marital status:  Single    Spouse name: Not on file   Number of children: 2   Years of education: Not on file   Highest education level: Not on file  Occupational History   Occupation: retired  Tobacco Use   Smoking status: Former    Types: Cigarettes    Quit date: 10/01/2008    Years since quitting: 13.6   Smokeless tobacco: Never  Substance and Sexual Activity   Alcohol use: Yes    Comment: occasional   Drug use: Never   Sexual activity: Not on file  Other Topics Concern   Not on file  Social History Narrative   Not on file   Social Determinants of Health   Financial Resource Strain: Not on file  Food Insecurity: Not on file  Transportation Needs: Not on file  Physical Activity: Not on file  Stress: Not on file  Social Connections: Not on file     Family History: The patient's family history includes Diabetes in her brother and maternal uncle; Heart disease in her mother. There is no history of Colon cancer, Esophageal cancer, Stomach cancer, or Rectal cancer.  ROS:   Please see the history of present illness.    All other systems reviewed and are negative.  EKGs/Labs/Other Studies Reviewed:    The following studies were reviewed today: EKG reveals sinus rhythm and nonspecific ST-T changes   Recent Labs: No results found for requested labs within last 365 days.  Recent Lipid Panel No results found for: "CHOL", "TRIG", "HDL", "CHOLHDL", "VLDL", "LDLCALC", "LDLDIRECT"  Physical Exam:    VS:  BP 116/67   Pulse 69   Ht '5\' 2"'$  (1.575 m)   Wt 208 lb 6.4 oz (94.5 kg)   SpO2 93%   BMI 38.12 kg/m     Wt Readings from Last 3 Encounters:  05/24/22 208 lb 6.4 oz (94.5 kg)  12/01/19 210 lb 6.4 oz (95.4 kg)  11/18/19 210 lb 6.4 oz (95.4 kg)     GEN: Patient is in no acute distress HEENT: Normal NECK: No JVD; No carotid bruits LYMPHATICS: No lymphadenopathy CARDIAC: S1 S2 regular, 2/6 systolic murmur at the apex. RESPIRATORY:  Clear to auscultation without rales, wheezing or  rhonchi  ABDOMEN: Soft, non-tender, non-distended MUSCULOSKELETAL:  No edema; No deformity  SKIN: Warm and dry NEUROLOGIC:  Alert and oriented x 3 PSYCHIATRIC:  Normal affect    Signed, Jenean Lindau, MD  05/24/2022 2:20 PM    [place]

## 2022-05-29 DIAGNOSIS — R109 Unspecified abdominal pain: Secondary | ICD-10-CM | POA: Diagnosis not present

## 2022-05-30 DIAGNOSIS — N950 Postmenopausal bleeding: Secondary | ICD-10-CM

## 2022-05-30 DIAGNOSIS — S52514S Nondisplaced fracture of right radial styloid process, sequela: Secondary | ICD-10-CM | POA: Diagnosis not present

## 2022-05-30 HISTORY — DX: Postmenopausal bleeding: N95.0

## 2022-05-31 DIAGNOSIS — M7541 Impingement syndrome of right shoulder: Secondary | ICD-10-CM | POA: Diagnosis not present

## 2022-05-31 DIAGNOSIS — M62521 Muscle wasting and atrophy, not elsewhere classified, right upper arm: Secondary | ICD-10-CM | POA: Diagnosis not present

## 2022-05-31 DIAGNOSIS — M25511 Pain in right shoulder: Secondary | ICD-10-CM | POA: Diagnosis not present

## 2022-06-05 DIAGNOSIS — E782 Mixed hyperlipidemia: Secondary | ICD-10-CM | POA: Diagnosis not present

## 2022-06-05 DIAGNOSIS — I10 Essential (primary) hypertension: Secondary | ICD-10-CM | POA: Diagnosis not present

## 2022-06-05 DIAGNOSIS — E039 Hypothyroidism, unspecified: Secondary | ICD-10-CM | POA: Diagnosis not present

## 2022-06-05 DIAGNOSIS — E1165 Type 2 diabetes mellitus with hyperglycemia: Secondary | ICD-10-CM | POA: Diagnosis not present

## 2022-06-06 DIAGNOSIS — M62521 Muscle wasting and atrophy, not elsewhere classified, right upper arm: Secondary | ICD-10-CM | POA: Diagnosis not present

## 2022-06-06 DIAGNOSIS — M25511 Pain in right shoulder: Secondary | ICD-10-CM | POA: Diagnosis not present

## 2022-06-06 DIAGNOSIS — M7541 Impingement syndrome of right shoulder: Secondary | ICD-10-CM | POA: Diagnosis not present

## 2022-06-07 ENCOUNTER — Telehealth (HOSPITAL_COMMUNITY): Payer: Self-pay

## 2022-06-08 DIAGNOSIS — M7541 Impingement syndrome of right shoulder: Secondary | ICD-10-CM | POA: Diagnosis not present

## 2022-06-08 DIAGNOSIS — M62521 Muscle wasting and atrophy, not elsewhere classified, right upper arm: Secondary | ICD-10-CM | POA: Diagnosis not present

## 2022-06-08 DIAGNOSIS — M25511 Pain in right shoulder: Secondary | ICD-10-CM | POA: Diagnosis not present

## 2022-06-12 DIAGNOSIS — M7541 Impingement syndrome of right shoulder: Secondary | ICD-10-CM | POA: Diagnosis not present

## 2022-06-12 DIAGNOSIS — M62521 Muscle wasting and atrophy, not elsewhere classified, right upper arm: Secondary | ICD-10-CM | POA: Diagnosis not present

## 2022-06-12 DIAGNOSIS — M25511 Pain in right shoulder: Secondary | ICD-10-CM | POA: Diagnosis not present

## 2022-06-13 DIAGNOSIS — M7541 Impingement syndrome of right shoulder: Secondary | ICD-10-CM | POA: Diagnosis not present

## 2022-06-13 DIAGNOSIS — M62521 Muscle wasting and atrophy, not elsewhere classified, right upper arm: Secondary | ICD-10-CM | POA: Diagnosis not present

## 2022-06-13 DIAGNOSIS — M25511 Pain in right shoulder: Secondary | ICD-10-CM | POA: Diagnosis not present

## 2022-06-14 ENCOUNTER — Ambulatory Visit (INDEPENDENT_AMBULATORY_CARE_PROVIDER_SITE_OTHER): Payer: Medicare Other

## 2022-06-14 DIAGNOSIS — R011 Cardiac murmur, unspecified: Secondary | ICD-10-CM | POA: Diagnosis not present

## 2022-06-14 DIAGNOSIS — R0609 Other forms of dyspnea: Secondary | ICD-10-CM

## 2022-06-14 LAB — MYOCARDIAL PERFUSION IMAGING
LV dias vol: 97 mL (ref 46–106)
LV sys vol: 38 mL
Nuc Stress EF: 61 %
Peak HR: 76 {beats}/min
Rest HR: 64 {beats}/min
Rest Nuclear Isotope Dose: 10.3 mCi
SDS: 5
SRS: 5
SSS: 10
ST Depression (mm): 0 mm
Stress Nuclear Isotope Dose: 29.5 mCi
TID: 0.96

## 2022-06-14 LAB — ECHOCARDIOGRAM COMPLETE
Area-P 1/2: 2.96 cm2
Height: 62 in
P 1/2 time: 488 msec
S' Lateral: 3.5 cm
Weight: 3328 oz

## 2022-06-14 MED ORDER — TECHNETIUM TC 99M TETROFOSMIN IV KIT
10.3000 | PACK | Freq: Once | INTRAVENOUS | Status: AC | PRN
  Administered 2022-06-14: 10.3 via INTRAVENOUS

## 2022-06-14 MED ORDER — REGADENOSON 0.4 MG/5ML IV SOLN
0.4000 mg | Freq: Once | INTRAVENOUS | Status: AC
  Administered 2022-06-14: 0.4 mg via INTRAVENOUS

## 2022-06-14 MED ORDER — TECHNETIUM TC 99M TETROFOSMIN IV KIT
29.5000 | PACK | Freq: Once | INTRAVENOUS | Status: AC | PRN
  Administered 2022-06-14: 29.5 via INTRAVENOUS

## 2022-06-14 MED ORDER — PERFLUTREN LIPID MICROSPHERE
1.0000 mL | INTRAVENOUS | Status: AC | PRN
  Administered 2022-06-14: 6 mL via INTRAVENOUS

## 2022-06-15 DIAGNOSIS — R9389 Abnormal findings on diagnostic imaging of other specified body structures: Secondary | ICD-10-CM | POA: Diagnosis not present

## 2022-06-19 ENCOUNTER — Telehealth: Payer: Self-pay

## 2022-06-19 DIAGNOSIS — M62521 Muscle wasting and atrophy, not elsewhere classified, right upper arm: Secondary | ICD-10-CM | POA: Diagnosis not present

## 2022-06-19 DIAGNOSIS — M7541 Impingement syndrome of right shoulder: Secondary | ICD-10-CM | POA: Diagnosis not present

## 2022-06-19 DIAGNOSIS — M25511 Pain in right shoulder: Secondary | ICD-10-CM | POA: Diagnosis not present

## 2022-06-19 NOTE — Telephone Encounter (Signed)
-----   Message from Jenean Lindau, MD sent at 06/15/2022 12:07 PM EDT ----- The results of the study is unremarkable. Please inform patient. I will discuss in detail at next appointment. Cc  primary care/referring physician Jenean Lindau, MD 06/15/2022 12:07 PM

## 2022-06-19 NOTE — Telephone Encounter (Signed)
Spoke to [employer]) notified of results, results forward to PCP.

## 2022-06-20 DIAGNOSIS — E1165 Type 2 diabetes mellitus with hyperglycemia: Secondary | ICD-10-CM | POA: Diagnosis not present

## 2022-06-20 DIAGNOSIS — I129 Hypertensive chronic kidney disease with stage 1 through stage 4 chronic kidney disease, or unspecified chronic kidney disease: Secondary | ICD-10-CM | POA: Diagnosis not present

## 2022-06-20 DIAGNOSIS — Z01818 Encounter for other preprocedural examination: Secondary | ICD-10-CM | POA: Diagnosis not present

## 2022-06-20 DIAGNOSIS — M7541 Impingement syndrome of right shoulder: Secondary | ICD-10-CM | POA: Diagnosis not present

## 2022-06-20 DIAGNOSIS — E039 Hypothyroidism, unspecified: Secondary | ICD-10-CM | POA: Diagnosis not present

## 2022-06-21 DIAGNOSIS — M25511 Pain in right shoulder: Secondary | ICD-10-CM | POA: Diagnosis not present

## 2022-06-21 DIAGNOSIS — M62521 Muscle wasting and atrophy, not elsewhere classified, right upper arm: Secondary | ICD-10-CM | POA: Diagnosis not present

## 2022-06-21 DIAGNOSIS — M7541 Impingement syndrome of right shoulder: Secondary | ICD-10-CM | POA: Diagnosis not present

## 2022-06-28 DIAGNOSIS — Z7984 Long term (current) use of oral hypoglycemic drugs: Secondary | ICD-10-CM | POA: Diagnosis not present

## 2022-06-28 DIAGNOSIS — E119 Type 2 diabetes mellitus without complications: Secondary | ICD-10-CM | POA: Diagnosis not present

## 2022-06-28 DIAGNOSIS — Z794 Long term (current) use of insulin: Secondary | ICD-10-CM | POA: Diagnosis not present

## 2022-06-28 DIAGNOSIS — I10 Essential (primary) hypertension: Secondary | ICD-10-CM | POA: Diagnosis not present

## 2022-06-28 DIAGNOSIS — R9389 Abnormal findings on diagnostic imaging of other specified body structures: Secondary | ICD-10-CM | POA: Diagnosis not present

## 2022-07-11 DIAGNOSIS — R9389 Abnormal findings on diagnostic imaging of other specified body structures: Secondary | ICD-10-CM | POA: Diagnosis not present

## 2022-07-19 DIAGNOSIS — H26492 Other secondary cataract, left eye: Secondary | ICD-10-CM | POA: Diagnosis not present

## 2022-07-19 DIAGNOSIS — E113413 Type 2 diabetes mellitus with severe nonproliferative diabetic retinopathy with macular edema, bilateral: Secondary | ICD-10-CM | POA: Diagnosis not present

## 2022-07-19 DIAGNOSIS — H47393 Other disorders of optic disc, bilateral: Secondary | ICD-10-CM | POA: Diagnosis not present

## 2022-07-19 DIAGNOSIS — H3412 Central retinal artery occlusion, left eye: Secondary | ICD-10-CM | POA: Diagnosis not present

## 2022-09-12 DIAGNOSIS — E1165 Type 2 diabetes mellitus with hyperglycemia: Secondary | ICD-10-CM | POA: Diagnosis not present

## 2022-09-12 DIAGNOSIS — Z794 Long term (current) use of insulin: Secondary | ICD-10-CM | POA: Diagnosis not present

## 2022-09-12 DIAGNOSIS — N182 Chronic kidney disease, stage 2 (mild): Secondary | ICD-10-CM | POA: Diagnosis not present

## 2022-09-12 DIAGNOSIS — E782 Mixed hyperlipidemia: Secondary | ICD-10-CM | POA: Diagnosis not present

## 2022-09-14 ENCOUNTER — Encounter: Payer: Self-pay | Admitting: Cardiology

## 2022-09-14 ENCOUNTER — Ambulatory Visit: Payer: Medicare Other | Attending: Cardiology | Admitting: Cardiology

## 2022-09-14 VITALS — BP 108/50 | HR 74 | Ht 61.0 in | Wt 212.0 lb

## 2022-09-14 DIAGNOSIS — I10 Essential (primary) hypertension: Secondary | ICD-10-CM

## 2022-09-14 DIAGNOSIS — E1169 Type 2 diabetes mellitus with other specified complication: Secondary | ICD-10-CM

## 2022-09-14 DIAGNOSIS — E782 Mixed hyperlipidemia: Secondary | ICD-10-CM

## 2022-09-14 NOTE — Progress Notes (Signed)
Cardiology Office Note:    Date:  09/14/2022   ID:  Sara Sara [name], DOB [birth year], MRN [id number]  PCP:  Merrilee Seashore, MD  Cardiologist:  Jenean Lindau, MD   Referring MD: Merrilee Seashore, MD    ASSESSMENT:    1. Essential hypertension   2. Type 2 diabetes mellitus with other specified complication, without long-term current use of insulin ([place])   3. Morbid obesity due to excess calories ([name])   4. Mixed hyperlipidemia    PLAN:    In order of problems listed above:  Primary prevention stressed with the Sara [name].  Importance of compliance with diet and medication stressed and she vocalized understanding. Essential hypertension: Blood pressure stable and diet was emphasized. Mixed dyslipidemia: On lipid-lowering medications followed by primary care.  Compliance urged. Diabetes mellitus and morbid obesity: She leads a sedentary lifestyle.  I cautioned her against this and discussed toxicities of a sedentary lifestyle and she promises to do better.  Weight loss was stressed risks of obesity explained. Sara [name] will be seen in follow-up appointment in 9 months or earlier if the Sara [name] has any concerns    Medication Adjustments/Labs and Tests Ordered: Current medicines are reviewed at length with the Sara [name] today.  Concerns regarding medicines are outlined above.  No orders of the defined types were placed in this encounter.  No orders of the defined types were placed in this encounter.    No chief complaint on file.    History of Present Illness:    Sara Sara [name] is a 76 y.o. female.  Sara [name] has past medical history of essential hypertension, dyslipidemia, diabetes mellitus and morbid obesity.  She leads a sedentary lifestyle.  She was evaluated for abnormal monitoring and dyspnea on exertion.  Fortunately her tests have come out unremarkable.  She denies any chest pain orthopnea or PND.  She is gradually beginning an exercise program without any symptoms.   At the time of my evaluation, the Sara [name] is alert awake oriented and in no distress.  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   DM (diabetes mellitus) ([place])    DOE (dyspnea on exertion) 05/24/2022   Essential hypertension 04/25/2022   Hyperglycemia due to type 2 diabetes mellitus ([place]) 05/24/2022   Hypertension with renal disease    Hypothyroidism    Long term (current) use of insulin ([place])    Menopause 05/24/2022   Mixed hyperlipidemia    Morbid obesity due to excess calories ([facility])    Obesity (BMI 35.0-39.9 without comorbidity) 05/24/2022   Postmenopausal bleeding 05/30/2022   PVD (peripheral vascular disease) ([facility])    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   aspirin EC 81 MG tablet Take 81 mg by mouth daily. Swallow whole.   atorvastatin (LIPITOR) 20 MG tablet Take 20 mg by mouth daily.   EUTHYROX 50 MCG tablet Take 50 mcg by mouth daily.   losartan-hydrochlorothiazide  (HYZAAR) 50-12.5 MG tablet Take 1 tablet by mouth daily.   metFORMIN (GLUCOPHAGE) 1000 MG tablet Take 1,000 mg by mouth daily with breakfast.   NOVOLOG MIX 70/30 FLEXPEN (70-30) 100 UNIT/ML FlexPen INJECT 25 UNITS SUBCUTANEOUSLY IN THE MORNING THEN INJECT 5 UNITS AT LUNCH AND 40 UNITS IN THE EVENING. TITRATE TO MMD 100 UNITS     Allergies:   Sara [name] has no known allergies.   Social History   Socioeconomic History   Marital status: Single    Spouse name: Not on file   Number of children: 2   Years of education: Not on file   Highest education level: Not on file  Occupational History   Occupation: retired  Tobacco Use   Smoking status: Former    Types: Cigarettes    Quit date: 10/01/2008    Years since quitting: 13.9   Smokeless tobacco: Never  Substance and Sexual Activity   Alcohol use: Yes    Comment: occasional   Drug use: Never   Sexual activity: Not on file  Other Topics Concern   Not on file  Social History Narrative   Not on file   Social Determinants of Health   Financial Resource Strain: Not on file  Food Insecurity: Not on file  Transportation Needs: Not on file  Physical Activity: Not on file  Stress: Not on file  Social Connections: Not on file     Family History: The Sara Sara [name] family history includes Diabetes in her brother and maternal uncle; Heart disease in her mother. There is no history of Colon cancer, Esophageal cancer, Stomach cancer, or Rectal cancer.  ROS:   Please see the history of present illness.    All other systems reviewed and are negative.  EKGs/Labs/Other Studies Reviewed:    The following studies were reviewed today: I discussed my findings with the Sara [name] at length.  Stress test was unremarkable   Recent Labs: No results found for requested labs within last 365 days.  Recent Lipid Panel No results found for: "CHOL", "TRIG", "HDL", "CHOLHDL", "VLDL", "LDLCALC", "LDLDIRECT"  Physical Exam:    VS:  BP (!) 108/50   Pulse 74    Ht '5\' 1"'$  (1.549 m)   Wt 212 lb (96.2 kg)   SpO2 93%   BMI 40.06 kg/m     Wt Readings from Last 3 Encounters:  09/14/22 212 lb (96.2 kg)  06/14/22 208 lb (94.3 kg)  05/24/22 208 lb 6.4 oz (94.5 kg)     GEN: Sara [name] is in no acute distress HEENT: Normal NECK: No JVD; No carotid bruits LYMPHATICS: No lymphadenopathy CARDIAC: Hear sounds regular, 2/6 systolic murmur at the apex. RESPIRATORY:  Clear to auscultation without rales, wheezing or rhonchi  ABDOMEN: Soft, non-tender, non-distended MUSCULOSKELETAL:  No edema; No deformity  SKIN: Warm and dry NEUROLOGIC:  Alert and oriented x 3 PSYCHIATRIC:  Normal affect   Signed, Jenean Lindau, MD  09/14/2022 1:28 PM    [facility]

## 2022-09-14 NOTE — Patient Instructions (Signed)
Medication Instructions:  Your physician recommends that you continue on your current medications as directed. Please refer to the Current Medication list given to you today.  *If you need a refill on your cardiac medications before your next appointment, please call your pharmacy*   Lab Work: NONE If you have labs (blood work) drawn today and your tests are completely normal, you will receive your results only by: [place] (if you have MyChart) OR A paper copy in the mail If you have any lab test that is abnormal or we need to change your treatment, we will call you to review the results.   Testing/Procedures: NONE   Follow-Up: At [facility], you and your health needs are our priority.  As part of our continuing mission to provide you with exceptional heart care, we have created designated Provider Care Teams.  These Care Teams include your primary Cardiologist (physician) and Advanced Practice Providers (APPs -  Physician Assistants and Nurse Practitioners) who all work together to provide you with the care you need, when you need it.  We recommend signing up for the patient portal called "MyChart".  Sign up information is provided on this After Visit Summary.  MyChart is used to connect with patients for Virtual Visits (Telemedicine).  Patients are able to view lab/test results, encounter notes, upcoming appointments, etc.  Non-urgent messages can be sent to your provider as well.   To learn more about what you can do with MyChart, go to [email or web address].    Your next appointment:   9 month(s)  The format for your next appointment:   In Person  Provider:   Jyl Heinz, MD    Other Instructions   Important Information About Sugar

## 2022-09-18 DIAGNOSIS — Z794 Long term (current) use of insulin: Secondary | ICD-10-CM | POA: Diagnosis not present

## 2022-09-18 DIAGNOSIS — N182 Chronic kidney disease, stage 2 (mild): Secondary | ICD-10-CM | POA: Diagnosis not present

## 2022-09-18 DIAGNOSIS — E1151 Type 2 diabetes mellitus with diabetic peripheral angiopathy without gangrene: Secondary | ICD-10-CM | POA: Diagnosis not present

## 2022-09-18 DIAGNOSIS — E039 Hypothyroidism, unspecified: Secondary | ICD-10-CM | POA: Diagnosis not present

## 2022-09-18 DIAGNOSIS — Z23 Encounter for immunization: Secondary | ICD-10-CM | POA: Diagnosis not present

## 2022-09-18 DIAGNOSIS — M533 Sacrococcygeal disorders, not elsewhere classified: Secondary | ICD-10-CM | POA: Diagnosis not present

## 2022-09-18 DIAGNOSIS — E782 Mixed hyperlipidemia: Secondary | ICD-10-CM | POA: Diagnosis not present

## 2022-09-18 DIAGNOSIS — I739 Peripheral vascular disease, unspecified: Secondary | ICD-10-CM | POA: Diagnosis not present

## 2022-09-18 DIAGNOSIS — I10 Essential (primary) hypertension: Secondary | ICD-10-CM | POA: Diagnosis not present

## 2022-09-18 DIAGNOSIS — I129 Hypertensive chronic kidney disease with stage 1 through stage 4 chronic kidney disease, or unspecified chronic kidney disease: Secondary | ICD-10-CM | POA: Diagnosis not present

## 2022-09-25 DIAGNOSIS — Z1231 Encounter for screening mammogram for malignant neoplasm of breast: Secondary | ICD-10-CM | POA: Diagnosis not present

## 2022-10-09 DIAGNOSIS — E1165 Type 2 diabetes mellitus with hyperglycemia: Secondary | ICD-10-CM | POA: Diagnosis not present

## 2022-10-09 DIAGNOSIS — I129 Hypertensive chronic kidney disease with stage 1 through stage 4 chronic kidney disease, or unspecified chronic kidney disease: Secondary | ICD-10-CM | POA: Diagnosis not present

## 2022-10-09 DIAGNOSIS — E039 Hypothyroidism, unspecified: Secondary | ICD-10-CM | POA: Diagnosis not present

## 2022-10-09 DIAGNOSIS — E782 Mixed hyperlipidemia: Secondary | ICD-10-CM | POA: Diagnosis not present

## 2022-10-11 ENCOUNTER — Encounter: Payer: Self-pay | Admitting: Gastroenterology

## 2022-11-09 IMAGING — US US EXTREM LOW VENOUS*R*
1 series · 13 of 24 positions shown · non-contrast
Comparison: None.

CLINICAL DATA: Right calf pain.



[Series 1: us venous img lower uni right (dvt) · portal-venous · 13 of 45 slices shown]
[im 1/45]
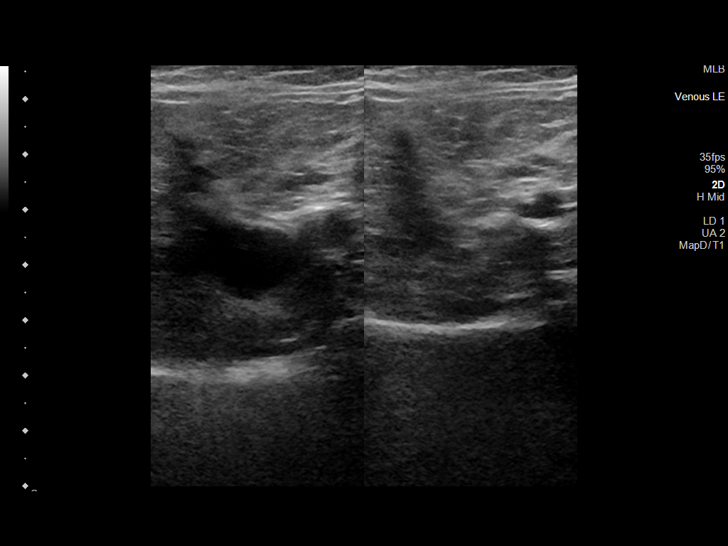
[im 4/45]
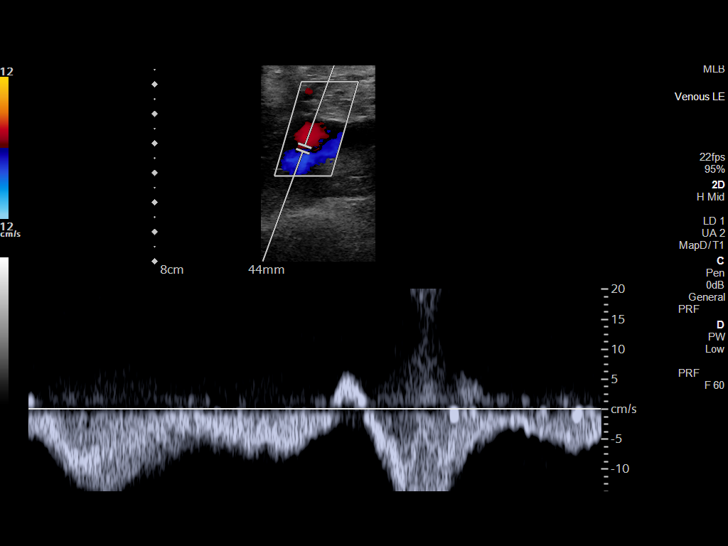
[im 8/45]
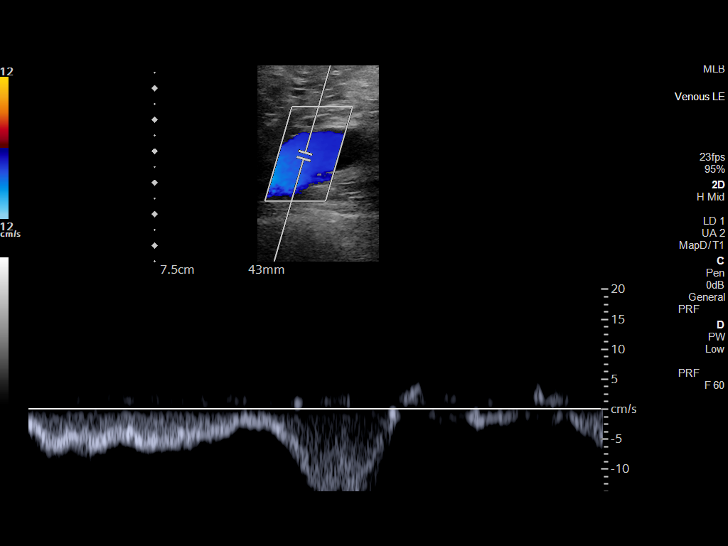
[im 12/45]
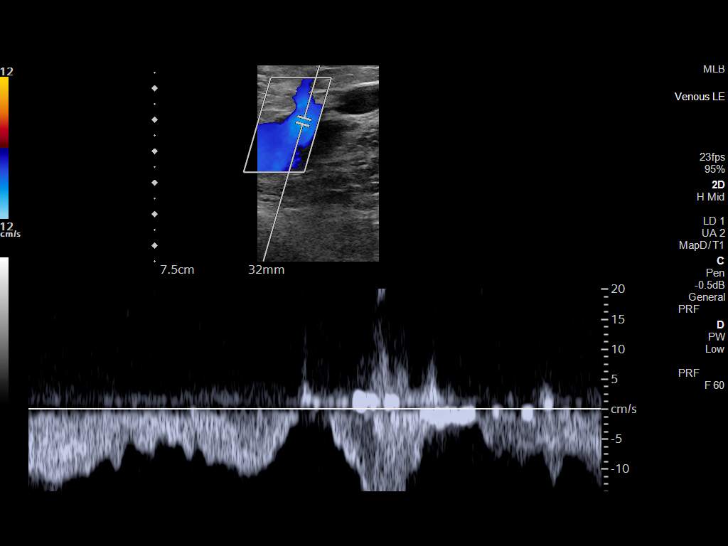
[im 16/45]
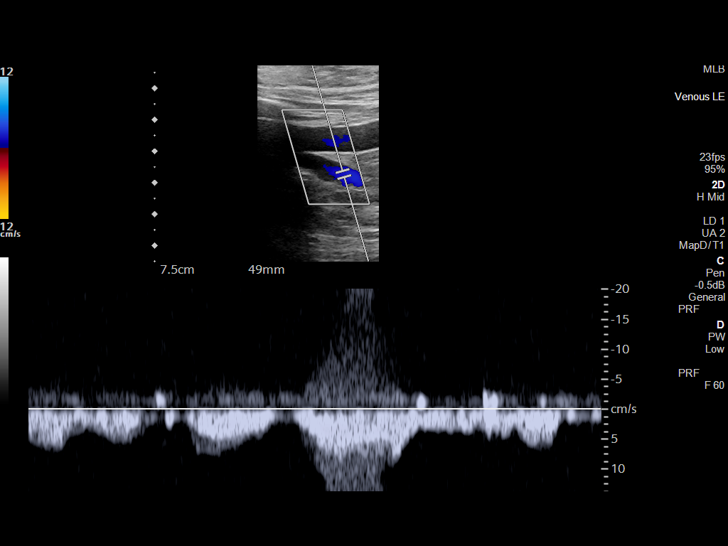
[im 20/45]
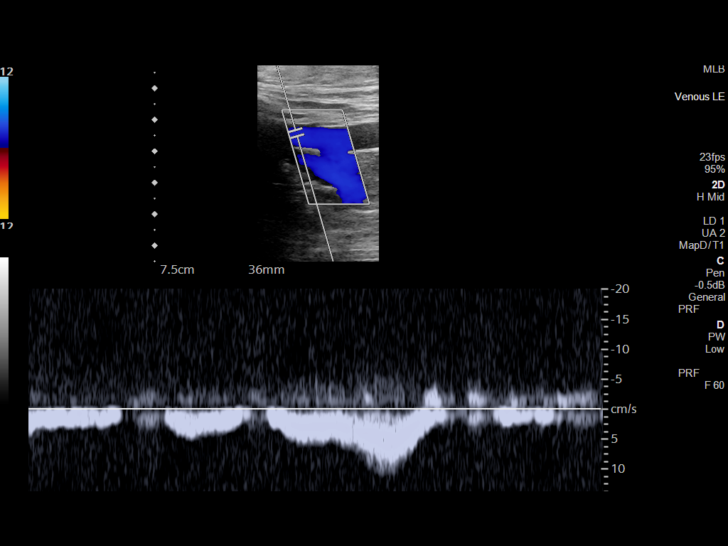
[im 23/45]
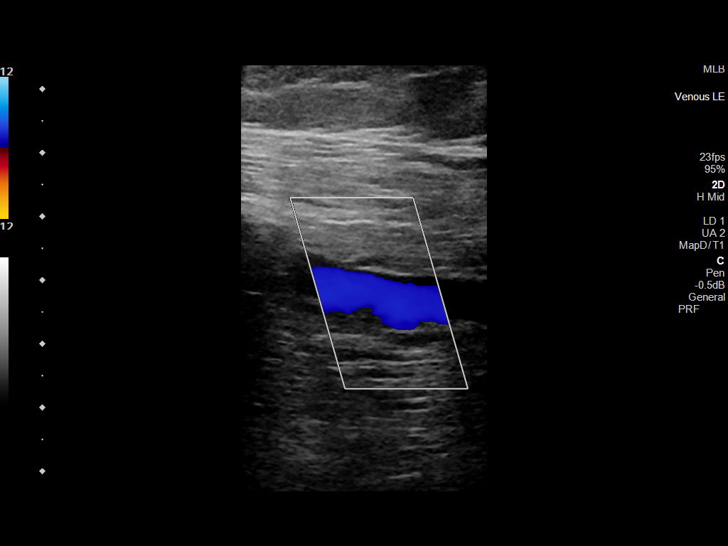
[im 25/45]
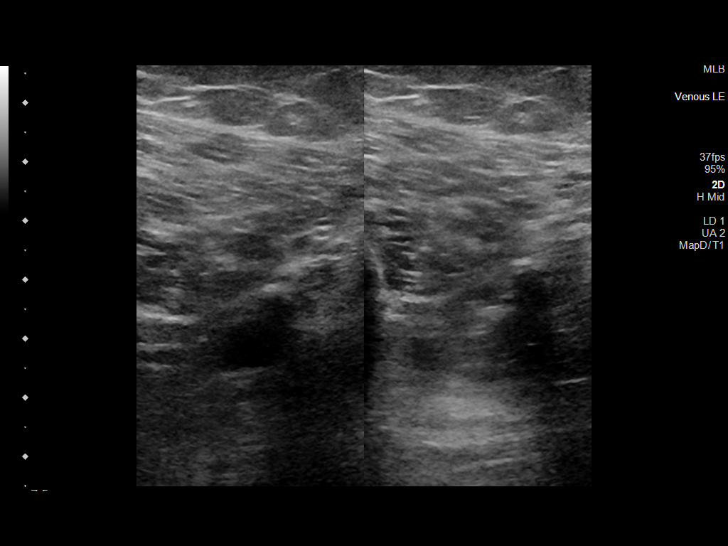
[im 29/45]
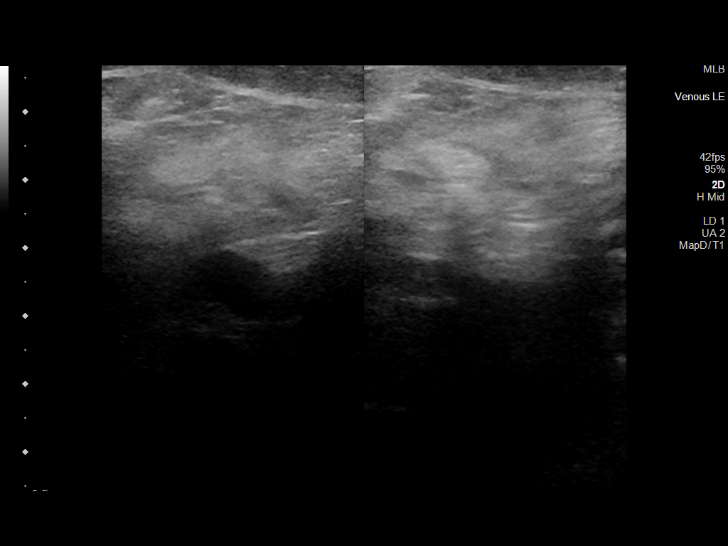
[im 33/45]
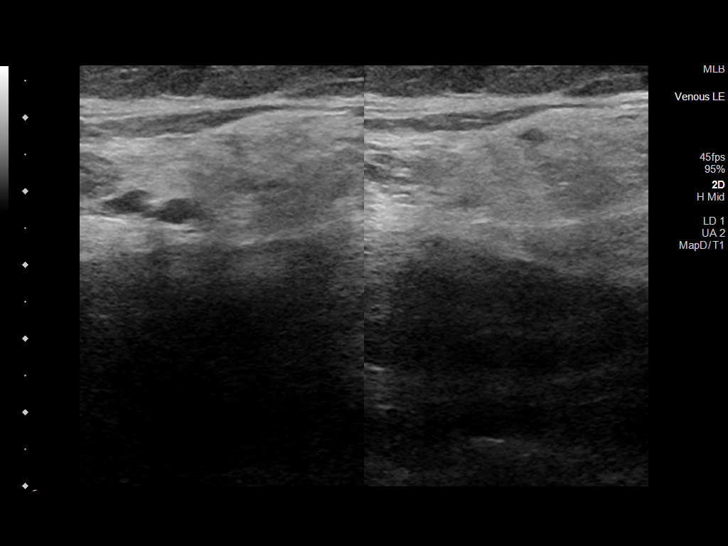
[im 37/45]
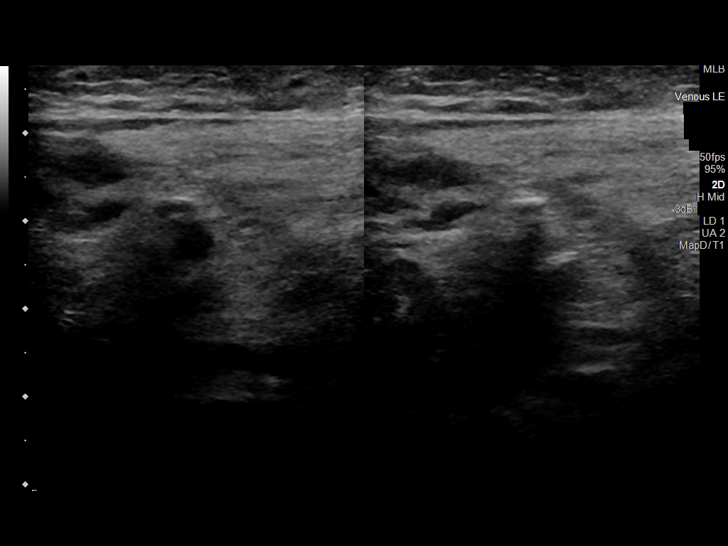
[im 41/45]
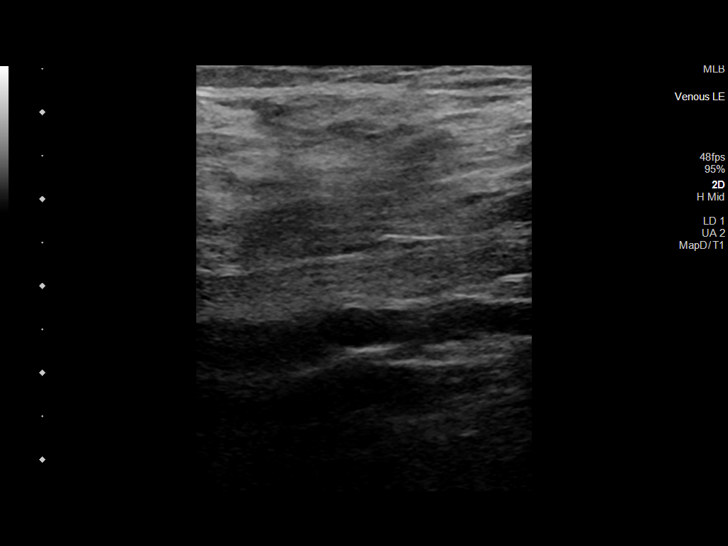
[im 45/45]
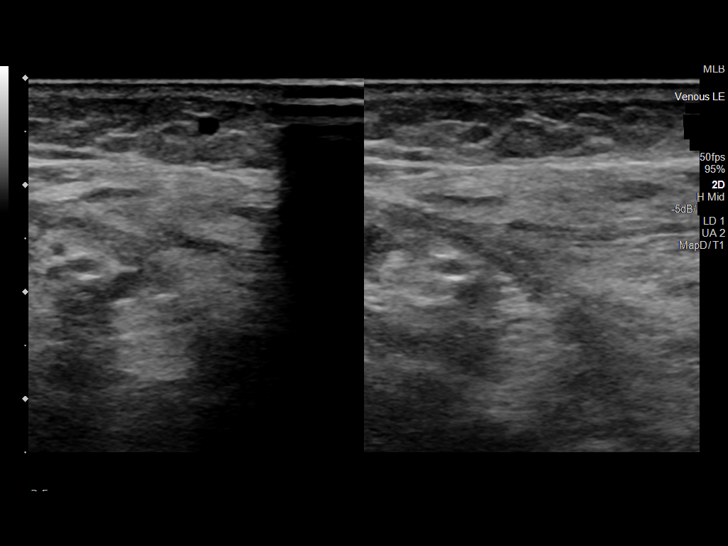

[13 of 24 positions shown; findings below may reference images not displayed]

FINDINGS: Contralateral Common Femoral Vein: Respiratory phasicity is normal
and symmetric with the symptomatic side. No evidence of thrombus.
Normal compressibility.

Common Femoral Vein: No evidence of thrombus. Normal
compressibility, respiratory phasicity and response to augmentation.

Saphenofemoral Junction: No evidence of thrombus. Normal
compressibility and flow on color Doppler imaging.

Profunda Femoral Vein: No evidence of thrombus. Normal
compressibility and flow on color Doppler imaging.

Femoral Vein: No evidence of thrombus. Normal compressibility,
respiratory phasicity and response to augmentation.

Popliteal Vein: No evidence of thrombus. Normal compressibility,
respiratory phasicity and response to augmentation.

Calf Veins: No evidence of thrombus. Normal compressibility and flow
on color Doppler imaging.

Superficial Great Saphenous Vein: No evidence of thrombus. Normal
compressibility.

Other Findings:  None.
IMPRESSION: Negative for deep venous thrombosis in right lower extremity.

## 2022-11-29 DIAGNOSIS — H3412 Central retinal artery occlusion, left eye: Secondary | ICD-10-CM | POA: Diagnosis not present

## 2022-11-29 DIAGNOSIS — E113413 Type 2 diabetes mellitus with severe nonproliferative diabetic retinopathy with macular edema, bilateral: Secondary | ICD-10-CM | POA: Diagnosis not present

## 2022-11-29 DIAGNOSIS — H44393 Other degenerative disorders of globe, bilateral: Secondary | ICD-10-CM | POA: Diagnosis not present

## 2022-12-12 DIAGNOSIS — L719 Rosacea, unspecified: Secondary | ICD-10-CM | POA: Diagnosis not present

## 2022-12-26 ENCOUNTER — Encounter: Payer: Self-pay | Admitting: Obstetrics and Gynecology

## 2023-01-03 ENCOUNTER — Encounter: Payer: Medicare Other | Admitting: Obstetrics and Gynecology

## 2023-01-15 DIAGNOSIS — L719 Rosacea, unspecified: Secondary | ICD-10-CM | POA: Diagnosis not present

## 2023-01-30 NOTE — Progress Notes (Signed)
77 y.o. P2 Single Hispanic female here for new GYN for recurrent postmenopausal bleeding. Spanish interpretor and daughter are present for the visit.   Patient is experiencing postmenopausal bleeding occurring twice a month, last time in February, 2024.  This first occurred in May, 2023.  Some cramping with the bleeding.  Patient is status post hysteroscopy with polypectomy at [facility] on 06/28/22.    No hormonal treatment.   She is unable to tolerate vaginal ultrasound.   She has a history of 2 Cesarean sections.   Not sexually active for 20 years.   PCP:   Merrilee Seashore, MD  No LMP recorded. Patient is postmenopausal.           Sexually active: No.  The current method of family planning is post menopausal status.    Exercising: No.     Smoker:  no  Health Maintenance: Pap:  May-June 2023 per pt, WNL History of abnormal Pap:  no MMG:  unsure but in 2023 per pt Colonoscopy:  12/01/19 BMD:   n/a  Result  n/a TDaP:  unsure Gardasil:   no HIV: n/a Hep C: n/a Screening Labs:  PCP   reports that she quit smoking about 14 years ago. Her smoking use included cigarettes. She has never used smokeless tobacco. She reports current alcohol use. She reports that she does not use drugs.  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   DM (diabetes mellitus)    DOE (dyspnea on exertion) 05/24/2022   Essential hypertension 04/25/2022   Hyperglycemia due to type 2 diabetes mellitus 05/24/2022   Hypertension with renal disease    Hypothyroidism    Long term (current) use of insulin    Menopause 05/24/2022    Mixed hyperlipidemia    Morbid obesity due to excess calories    Obesity (BMI 35.0-39.9 without comorbidity) 05/24/2022   Postmenopausal bleeding 05/30/2022   PVD (peripheral vascular disease)    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  4   aspirin EC 81 MG tablet Take 81 mg by mouth daily. Swallow whole.     atorvastatin (LIPITOR) 20 MG tablet Take 20 mg by mouth daily.     EUTHYROX 50 MCG tablet Take 50 mcg by mouth daily.     losartan-hydrochlorothiazide (HYZAAR) 50-12.5 MG tablet Take 1 tablet by mouth daily.     metFORMIN (GLUCOPHAGE) 1000 MG tablet Take 1,000 mg by mouth daily with  breakfast.     NOVOLOG MIX 70/30 FLEXPEN (70-30) 100 UNIT/ML FlexPen INJECT 25 UNITS SUBCUTANEOUSLY IN THE MORNING THEN INJECT 5 UNITS AT LUNCH AND 40 UNITS IN THE EVENING. TITRATE TO MMD 100 UNITS  6   No current facility-administered medications for this visit.    Family History  Problem Relation Age of Onset   Heart disease Mother    Diabetes Brother    Diabetes Maternal Uncle    Colon cancer Neg Hx    Esophageal cancer Neg Hx    Stomach cancer Neg Hx    Rectal cancer Neg Hx     Review of Systems  All other systems reviewed and are negative.   Exam:   BP 120/62 (BP Location: Right Arm, Patient Position: Sitting, Cuff Size: Large)   Pulse 71   Ht  5\' 1"  (1.549 m)   Wt 210 lb (95.3 kg)   SpO2 92%   BMI 39.68 kg/m     General appearance: alert, cooperative and appears stated age Head: normocephalic, without obvious abnormality, atraumatic Neck: no adenopathy, supple, symmetrical, trachea midline and thyroid normal to inspection and palpation Lungs: clear to auscultation bilaterally Heart: regular rate and rhythm Abdomen: vertical midline incision with probable left ventral hernia.  Abdomen is obese, soft, non-tender; no masses, no organomegaly Extremities: extremities normal, atraumatic, no cyanosis or edema Skin: skin color, texture, turgor normal. No rashes or lesions Lymph nodes: cervical, supraclavicular, and axillary nodes normal. Neurologic: grossly normal  Pelvic: External genitalia:  no lesions              No abnormal inguinal nodes palpated.              Urethra:  normal appearing urethra with no masses, tenderness or lesions              Bartholins and Skenes: normal                 Vagina: normal appearing vagina with normal color and discharge, no lesions              Cervix: no lesions.   Cervical is small and very high in the vaginal vault.              Pap taken: yes Bimanual Exam:  Uterus:  normal size, contour, position, consistency, mobility, non-tender              Adnexa: no mass, fullness, tenderness              Rectal exam: yes.  Confirms.              Anus:  normal sphincter tone, no lesions  Chaperone was present for exam:  Oneal Deputy, CMA  Assessment:   Recurrent postmenopausal bleeding.  Status post hysteroscopy with polypectomy.   Hx C/S x 2.  DM with complications.  Renal disease.  Hx stroke.   Plan: We discussed causes of postmenopausal bleeding:  atrophy, hormones, polyps, cancer, and precancer.  Pap and HR HPV collected.  Return for pelvic ultrasound.  I expect that an office endometrial biopsy will be difficult to perform, given the patient's anatomy.    After visit summary provided.    38 min  total time was spent for this patient encounter, including preparation, face-to-face counseling with the patient, coordination of care, and documentation of the encounter.

## 2023-02-13 ENCOUNTER — Ambulatory Visit (INDEPENDENT_AMBULATORY_CARE_PROVIDER_SITE_OTHER): Payer: Medicare Other | Admitting: Obstetrics and Gynecology

## 2023-02-13 ENCOUNTER — Other Ambulatory Visit (HOSPITAL_COMMUNITY)
Admission: RE | Admit: 2023-02-13 | Discharge: 2023-02-13 | Disposition: A | Discharge status: Home / Self Care | Payer: Medicare Other | Source: Ambulatory Visit | Attending: Obstetrics and Gynecology | Admitting: Obstetrics and Gynecology

## 2023-02-13 ENCOUNTER — Encounter: Payer: Self-pay | Admitting: Obstetrics and Gynecology

## 2023-02-13 VITALS — BP 120/62 | HR 71 | Ht 61.0 in | Wt 210.0 lb

## 2023-02-13 DIAGNOSIS — N950 Postmenopausal bleeding: Secondary | ICD-10-CM | POA: Diagnosis not present

## 2023-02-13 DIAGNOSIS — Z124 Encounter for screening for malignant neoplasm of cervix: Secondary | ICD-10-CM | POA: Diagnosis not present

## 2023-02-13 DIAGNOSIS — Z1151 Encounter for screening for human papillomavirus (HPV): Secondary | ICD-10-CM | POA: Insufficient documentation

## 2023-02-13 NOTE — Patient Instructions (Signed)
Hemorragia posmenopusica Postmenopausal Bleeding La hemorragia posmenopusica es cualquier sangrado que ocurre despus de la menopausia. La menopausia es un momento de la vida de una mujer en el que se detienen los perodos [place]. El mdico debe controlar cualquier tipo de sangrado que tenga despus de la menopausia. El tratamiento depender de la causa. Este tipo de hemorragia puede ser causada por: El uso de hormonas durante la menopausia. Cantidades bajas o altas de hormonas femeninas en el cuerpo. Esto puede hacer que el revestimiento del utero se vuelva demasiado delgado o demasiado grueso. Cncer. Crecimientos en el tero que no son cncer. Siga estas instrucciones en su casa:  Controle si hay algn cambio en sus sntomas. Informe a su mdico acerca de los cambios. Evite usar tampones y Electrical engineer duchas vaginales como se lo haya indicado el mdico. Cmbiese las toallas higinicas de forma regular. Hgase exmenes plvicos regulares. Esto incluye las pruebas de [place]. Tome comprimidos de hierro como se lo haya indicado el mdico. Use los medicamentos de venta libre y los recetados solamente como se lo haya indicado el mdico. Cumpla con todas las visitas de seguimiento. Comunquese con un mdico si: Tiene un nuevo sangrado de la vagina despus de la menopausia. Siente dolor en el vientre (abdomen). Solicite ayuda de inmediato si: Tiene fiebre o escalofros. Tiene un dolor muy intenso con sangrado. Elimina grumos de sangre (cogulos de [place]) por la vagina. Tiene mucho sangrado y: [place] ms de 1 compresa por hora. Es la primera vez que tiene un sangrado de este tipo. Tiene dolores de [place]. Se siente mareado o como si se fuera a desmayar. Resumen El mdico debe controlar cualquier tipo de sangrado que tenga despus de la menopausia. Evite usar tampones o duchas vaginales. Hgase exmenes plvicos regulares. Esto incluye las pruebas de [place]. Pngase en contacto con  un mdico si tiene sangrado o dolor nuevo en el vientre. Controle si hay algn cambio en sus sntomas. Informe a su mdico acerca de los cambios. Esta informacin no tiene Marine scientist el consejo del mdico. Asegrese de hacerle al mdico cualquier pregunta que tenga. Document Revised: 05/31/2020 Document Reviewed: 05/31/2020 Elsevier Patient Education  [place].

## 2023-02-14 LAB — CYTOLOGY - PAP
Adequacy: ABSENT
Comment: NEGATIVE
Diagnosis: NEGATIVE
High risk HPV: NEGATIVE

## 2023-03-05 NOTE — Progress Notes (Unsigned)
GYNECOLOGY  VISIT   HPI: 77 y.o.   Single  Hispanic  female   No obstetric history on file. with No LMP recorded. Patient is postmenopausal.   here for  U/S consult.  Her daughter is present for the visit today.   Patient is experiencing postmenopausal bleeding occurring twice a month, last time in February, 2024. Bleeding first occurred in May, 2023.  Some cramping with the bleeding episodes.   Patient is status post hysteroscopy with polypectomy at [facility] on 06/28/22.     Not taking any hormonal treatment.   She has a very small cervix, high in the vaginal vault on pelvic exam.   GYNECOLOGIC HISTORY: No LMP recorded. Patient is postmenopausal. Contraception:  PMP Menopausal hormone therapy:  n/a Last mammogram:  unsure but in 2023 per pt, WNL  Last pap smear:   02/13/23 - WNL, neg HR HPV.         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   Transient arterial retinal occlusion 05/24/2022   Chronic kidney disease due to hypertension 05/24/2022   Chronic kidney disease, stage 2 (mild) 05/24/2022   Type 2 diabetes mellitus with other specified complication (HCC) 05/24/2022   Type 2 diabetes mellitus with peripheral angiopathy (HCC) 05/24/2022   Hyperglycemia due to type 2 diabetes mellitus (HCC) 05/24/2022   DOE (dyspnea on exertion) 05/24/2022   Obesity (BMI 35.0-39.9 without comorbidity) 05/24/2022   Cardiac murmur 05/24/2022    Anemia 04/25/2022   Chronic renal insufficiency, stage II (mild) 04/25/2022   DM (diabetes mellitus) (HCC) 04/25/2022   Essential hypertension 04/25/2022   Hypertension with renal disease 04/25/2022   Hypothyroidism 04/25/2022   Long term (current) use of insulin (HCC) 04/25/2022   Mixed hyperlipidemia 04/25/2022   Morbid obesity due to excess calories (HCC) 04/25/2022   PVD (peripheral vascular disease) (HCC) 04/25/2022   Type 2 diabetes mellitus with hyperglycemia (HCC) 04/25/2022   Cataract 2016    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/2022   DM (diabetes mellitus) (HCC)    DOE (dyspnea on exertion) 05/24/2022   Essential hypertension 04/25/2022   Hyperglycemia due to type 2 diabetes mellitus (HCC) 05/24/2022   Hypertension with renal disease    Hypothyroidism    Long term (current) use of insulin (HCC)  Menopause 05/24/2022   Mixed hyperlipidemia    Morbid obesity due to excess calories (HCC)    Obesity (BMI 35.0-39.9 without comorbidity) 05/24/2022   Postmenopausal bleeding 05/30/2022   PVD (peripheral vascular disease) (HCC)    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) 05/24/2022   Stroke (HCC) 05/24/2022   Transient arterial retinal occlusion 05/24/2022   Type 2 diabetes mellitus with hyperglycemia (HCC)    Type 2 diabetes mellitus with other specified complication (HCC) 05/24/2022   Type 2 diabetes mellitus with peripheral angiopathy (HCC) 05/24/2022    Past Surgical History:  Procedure Laterality Date    APPENDECTOMY     CATARACT EXTRACTION, BILATERAL     CESAREAN SECTION     polypectomy  2023   Uterine   TUBAL LIGATION      Current Outpatient Medications  Medication Sig Dispense Refill   amLODipine (NORVASC) 10 MG tablet Take 10 mg by mouth daily.  4   aspirin EC 81 MG tablet Take 81 mg by mouth daily. Swallow whole.     atorvastatin (LIPITOR) 20 MG tablet Take 20 mg by mouth daily.     EUTHYROX 50 MCG tablet Take 50 mcg by mouth daily.     losartan-hydrochlorothiazide (HYZAAR) 50-12.5 MG tablet Take 1 tablet by mouth daily.     metFORMIN (GLUCOPHAGE) 1000 MG tablet Take 1,000 mg by mouth daily with breakfast.     NOVOLOG MIX 70/30 FLEXPEN (70-30) 100 UNIT/ML FlexPen INJECT 25 UNITS SUBCUTANEOUSLY IN THE MORNING THEN INJECT 5 UNITS AT LUNCH AND 40 UNITS IN THE EVENING. TITRATE TO MMD 100 UNITS  6   No current facility-administered medications for this visit.     ALLERGIES: Patient has no known allergies.  Family History  Problem Relation Age of Onset   Heart disease Mother    Diabetes Brother    Diabetes Maternal Uncle    Colon cancer Neg Hx    Esophageal cancer Neg Hx    Stomach cancer Neg Hx    Rectal cancer Neg Hx     Social History   Socioeconomic History   Marital status: Single    Spouse name: Not on file   Number of children: 2   Years of education: Not on file   Highest education level: Not on file  Occupational History   Occupation: retired  Tobacco Use   Smoking status: Former    Types: Cigarettes    Quit date: 10/01/2008    Years since quitting: 14.4   Smokeless tobacco: Never  Substance and Sexual Activity   Alcohol use: Yes    Comment: occasional   Drug use: Never   Sexual activity: Not Currently    Birth control/protection: Post-menopausal  Other Topics Concern   Not on file  Social History Narrative   Not on file   Social Determinants of Health   Financial Resource Strain: Not on file  Food Insecurity: Not on file  Transportation Needs:  Not on file  Physical Activity: Not on file  Stress: Not on file  Social Connections: Not on file  Intimate Partner Violence: Not on file    Review of Systems  All other systems reviewed and are negative.   PHYSICAL EXAMINATION:    BP 120/70 (BP Location: Right Arm, Patient Position: Sitting, Cuff Size: Large)   Pulse 79   Ht 5\' 1"  (1.549 m)   Wt 210 lb (95.3 kg)   SpO2 (!) 83%  BMI 39.68 kg/m     General appearance: alert, cooperative and appears stated age  Pelvic US Uterus 9.83 x 5.48 x 3.55 cm.  No myometrial masses.  EMS 3.30 mm.  No masses or thickening.  Avascular.  Ovaries not visualized.  No adnexal masses.  No free fluid.   ASSESSMENT  History of postmenopausal bleeding. Normal pelvic US.   PLAN  [place] images and report reviewed.  Reassurance given.  No further evaluation is needed at this time.  Return for any future vaginal bleeding or other gynecologic concern.   16 min  total time was spent for this patient encounter, including preparation, face-to-face counseling with the patient, coordination of care, and documentation of the encounter.

## 2023-03-19 ENCOUNTER — Encounter: Payer: Self-pay | Admitting: Obstetrics and Gynecology

## 2023-03-19 ENCOUNTER — Ambulatory Visit: Payer: Medicare Other | Admitting: Obstetrics and Gynecology

## 2023-03-19 ENCOUNTER — Ambulatory Visit (INDEPENDENT_AMBULATORY_CARE_PROVIDER_SITE_OTHER): Payer: Medicare Other

## 2023-03-19 VITALS — BP 120/70 | HR 79 | Ht 61.0 in | Wt 210.0 lb

## 2023-03-19 DIAGNOSIS — N950 Postmenopausal bleeding: Secondary | ICD-10-CM

## 2023-03-19 DIAGNOSIS — Z8742 Personal history of other diseases of the female genital tract: Secondary | ICD-10-CM

## 2023-04-02 DIAGNOSIS — E782 Mixed hyperlipidemia: Secondary | ICD-10-CM | POA: Diagnosis not present

## 2023-04-02 DIAGNOSIS — E1151 Type 2 diabetes mellitus with diabetic peripheral angiopathy without gangrene: Secondary | ICD-10-CM | POA: Diagnosis not present

## 2023-04-02 DIAGNOSIS — N182 Chronic kidney disease, stage 2 (mild): Secondary | ICD-10-CM | POA: Diagnosis not present

## 2023-04-02 DIAGNOSIS — Z794 Long term (current) use of insulin: Secondary | ICD-10-CM | POA: Diagnosis not present

## 2023-04-03 DIAGNOSIS — I639 Cerebral infarction, unspecified: Secondary | ICD-10-CM | POA: Diagnosis not present

## 2023-04-03 DIAGNOSIS — I10 Essential (primary) hypertension: Secondary | ICD-10-CM | POA: Diagnosis not present

## 2023-04-03 DIAGNOSIS — H3402 Transient retinal artery occlusion, left eye: Secondary | ICD-10-CM | POA: Diagnosis not present

## 2023-04-03 DIAGNOSIS — H34232 Retinal artery branch occlusion, left eye: Secondary | ICD-10-CM | POA: Diagnosis not present

## 2023-04-10 DIAGNOSIS — I10 Essential (primary) hypertension: Secondary | ICD-10-CM | POA: Diagnosis not present

## 2023-04-10 DIAGNOSIS — I739 Peripheral vascular disease, unspecified: Secondary | ICD-10-CM | POA: Diagnosis not present

## 2023-04-10 DIAGNOSIS — I129 Hypertensive chronic kidney disease with stage 1 through stage 4 chronic kidney disease, or unspecified chronic kidney disease: Secondary | ICD-10-CM | POA: Diagnosis not present

## 2023-04-10 DIAGNOSIS — E039 Hypothyroidism, unspecified: Secondary | ICD-10-CM | POA: Diagnosis not present

## 2023-04-10 DIAGNOSIS — N182 Chronic kidney disease, stage 2 (mild): Secondary | ICD-10-CM | POA: Diagnosis not present

## 2023-04-10 DIAGNOSIS — E1151 Type 2 diabetes mellitus with diabetic peripheral angiopathy without gangrene: Secondary | ICD-10-CM | POA: Diagnosis not present

## 2023-04-10 DIAGNOSIS — E782 Mixed hyperlipidemia: Secondary | ICD-10-CM | POA: Diagnosis not present

## 2023-04-10 DIAGNOSIS — Z794 Long term (current) use of insulin: Secondary | ICD-10-CM | POA: Diagnosis not present

## 2023-04-10 DIAGNOSIS — Z0000 Encounter for general adult medical examination without abnormal findings: Secondary | ICD-10-CM | POA: Diagnosis not present

## 2023-04-17 DIAGNOSIS — L719 Rosacea, unspecified: Secondary | ICD-10-CM | POA: Diagnosis not present

## 2023-04-17 DIAGNOSIS — L700 Acne vulgaris: Secondary | ICD-10-CM | POA: Diagnosis not present

## 2023-04-25 DIAGNOSIS — E1165 Type 2 diabetes mellitus with hyperglycemia: Secondary | ICD-10-CM | POA: Diagnosis not present

## 2023-04-25 DIAGNOSIS — I129 Hypertensive chronic kidney disease with stage 1 through stage 4 chronic kidney disease, or unspecified chronic kidney disease: Secondary | ICD-10-CM | POA: Diagnosis not present

## 2023-04-25 DIAGNOSIS — E782 Mixed hyperlipidemia: Secondary | ICD-10-CM | POA: Diagnosis not present

## 2023-04-25 DIAGNOSIS — E039 Hypothyroidism, unspecified: Secondary | ICD-10-CM | POA: Diagnosis not present

## 2023-05-02 DIAGNOSIS — E1169 Type 2 diabetes mellitus with other specified complication: Secondary | ICD-10-CM | POA: Diagnosis not present

## 2023-05-02 DIAGNOSIS — Z7689 Persons encountering health services in other specified circumstances: Secondary | ICD-10-CM | POA: Diagnosis not present

## 2023-05-02 DIAGNOSIS — E785 Hyperlipidemia, unspecified: Secondary | ICD-10-CM | POA: Diagnosis not present

## 2023-05-02 DIAGNOSIS — I10 Essential (primary) hypertension: Secondary | ICD-10-CM | POA: Diagnosis not present

## 2023-05-02 DIAGNOSIS — E039 Hypothyroidism, unspecified: Secondary | ICD-10-CM | POA: Diagnosis not present

## 2023-05-23 DIAGNOSIS — L700 Acne vulgaris: Secondary | ICD-10-CM | POA: Diagnosis not present

## 2023-05-29 DIAGNOSIS — L719 Rosacea, unspecified: Secondary | ICD-10-CM | POA: Diagnosis not present

## 2023-05-29 DIAGNOSIS — N182 Chronic kidney disease, stage 2 (mild): Secondary | ICD-10-CM | POA: Diagnosis not present

## 2023-05-29 DIAGNOSIS — E782 Mixed hyperlipidemia: Secondary | ICD-10-CM | POA: Diagnosis not present

## 2023-05-29 DIAGNOSIS — E1151 Type 2 diabetes mellitus with diabetic peripheral angiopathy without gangrene: Secondary | ICD-10-CM | POA: Diagnosis not present

## 2023-05-29 DIAGNOSIS — I739 Peripheral vascular disease, unspecified: Secondary | ICD-10-CM | POA: Diagnosis not present

## 2023-05-29 DIAGNOSIS — E039 Hypothyroidism, unspecified: Secondary | ICD-10-CM | POA: Diagnosis not present

## 2023-05-29 DIAGNOSIS — E1165 Type 2 diabetes mellitus with hyperglycemia: Secondary | ICD-10-CM | POA: Diagnosis not present

## 2023-05-29 DIAGNOSIS — H34232 Retinal artery branch occlusion, left eye: Secondary | ICD-10-CM | POA: Diagnosis not present

## 2023-05-29 DIAGNOSIS — I129 Hypertensive chronic kidney disease with stage 1 through stage 4 chronic kidney disease, or unspecified chronic kidney disease: Secondary | ICD-10-CM | POA: Diagnosis not present

## 2023-05-29 DIAGNOSIS — I10 Essential (primary) hypertension: Secondary | ICD-10-CM | POA: Diagnosis not present

## 2023-05-29 DIAGNOSIS — Z794 Long term (current) use of insulin: Secondary | ICD-10-CM | POA: Diagnosis not present

## 2023-06-05 DIAGNOSIS — E113413 Type 2 diabetes mellitus with severe nonproliferative diabetic retinopathy with macular edema, bilateral: Secondary | ICD-10-CM | POA: Diagnosis not present

## 2023-06-05 DIAGNOSIS — H47393 Other disorders of optic disc, bilateral: Secondary | ICD-10-CM | POA: Diagnosis not present

## 2023-06-25 DIAGNOSIS — L03115 Cellulitis of right lower limb: Secondary | ICD-10-CM | POA: Diagnosis not present

## 2023-06-25 DIAGNOSIS — L600 Ingrowing nail: Secondary | ICD-10-CM | POA: Diagnosis not present

## 2023-06-27 DIAGNOSIS — I10 Essential (primary) hypertension: Secondary | ICD-10-CM | POA: Diagnosis not present

## 2023-06-27 DIAGNOSIS — E1169 Type 2 diabetes mellitus with other specified complication: Secondary | ICD-10-CM | POA: Diagnosis not present

## 2023-06-27 DIAGNOSIS — L03031 Cellulitis of right toe: Secondary | ICD-10-CM | POA: Diagnosis not present

## 2023-06-27 DIAGNOSIS — R0902 Hypoxemia: Secondary | ICD-10-CM | POA: Diagnosis not present

## 2023-06-28 DIAGNOSIS — I517 Cardiomegaly: Secondary | ICD-10-CM | POA: Diagnosis not present

## 2023-06-28 DIAGNOSIS — R7989 Other specified abnormal findings of blood chemistry: Secondary | ICD-10-CM | POA: Diagnosis not present

## 2023-06-28 DIAGNOSIS — M7731 Calcaneal spur, right foot: Secondary | ICD-10-CM | POA: Diagnosis not present

## 2023-06-28 DIAGNOSIS — L03031 Cellulitis of right toe: Secondary | ICD-10-CM | POA: Diagnosis not present

## 2023-06-28 DIAGNOSIS — R0902 Hypoxemia: Secondary | ICD-10-CM | POA: Diagnosis not present

## 2023-06-28 DIAGNOSIS — J984 Other disorders of lung: Secondary | ICD-10-CM | POA: Diagnosis not present

## 2023-06-28 DIAGNOSIS — R918 Other nonspecific abnormal finding of lung field: Secondary | ICD-10-CM | POA: Diagnosis not present

## 2023-07-05 DIAGNOSIS — U099 Post covid-19 condition, unspecified: Secondary | ICD-10-CM | POA: Diagnosis not present

## 2023-07-05 DIAGNOSIS — E1142 Type 2 diabetes mellitus with diabetic polyneuropathy: Secondary | ICD-10-CM | POA: Diagnosis not present

## 2023-07-05 DIAGNOSIS — L03031 Cellulitis of right toe: Secondary | ICD-10-CM | POA: Diagnosis not present

## 2023-07-05 DIAGNOSIS — R0902 Hypoxemia: Secondary | ICD-10-CM | POA: Diagnosis not present

## 2023-07-18 DIAGNOSIS — L609 Nail disorder, unspecified: Secondary | ICD-10-CM | POA: Diagnosis not present

## 2023-07-18 DIAGNOSIS — Z23 Encounter for immunization: Secondary | ICD-10-CM | POA: Diagnosis not present

## 2023-08-01 DIAGNOSIS — E1169 Type 2 diabetes mellitus with other specified complication: Secondary | ICD-10-CM | POA: Diagnosis not present

## 2023-08-01 DIAGNOSIS — E039 Hypothyroidism, unspecified: Secondary | ICD-10-CM | POA: Diagnosis not present

## 2023-08-08 DIAGNOSIS — U099 Post covid-19 condition, unspecified: Secondary | ICD-10-CM | POA: Diagnosis not present

## 2023-09-16 DIAGNOSIS — L603 Nail dystrophy: Secondary | ICD-10-CM | POA: Diagnosis not present

## 2023-09-16 DIAGNOSIS — E119 Type 2 diabetes mellitus without complications: Secondary | ICD-10-CM | POA: Diagnosis not present

## 2023-09-16 DIAGNOSIS — B351 Tinea unguium: Secondary | ICD-10-CM | POA: Diagnosis not present

## 2023-09-16 DIAGNOSIS — Z7984 Long term (current) use of oral hypoglycemic drugs: Secondary | ICD-10-CM | POA: Diagnosis not present

## 2023-10-04 DIAGNOSIS — N182 Chronic kidney disease, stage 2 (mild): Secondary | ICD-10-CM | POA: Diagnosis not present

## 2023-10-04 DIAGNOSIS — Z794 Long term (current) use of insulin: Secondary | ICD-10-CM | POA: Diagnosis not present

## 2023-10-04 DIAGNOSIS — E782 Mixed hyperlipidemia: Secondary | ICD-10-CM | POA: Diagnosis not present

## 2023-10-04 DIAGNOSIS — E1151 Type 2 diabetes mellitus with diabetic peripheral angiopathy without gangrene: Secondary | ICD-10-CM | POA: Diagnosis not present

## 2023-10-04 DIAGNOSIS — I129 Hypertensive chronic kidney disease with stage 1 through stage 4 chronic kidney disease, or unspecified chronic kidney disease: Secondary | ICD-10-CM | POA: Diagnosis not present

## 2023-10-04 DIAGNOSIS — I10 Essential (primary) hypertension: Secondary | ICD-10-CM | POA: Diagnosis not present

## 2023-10-04 DIAGNOSIS — H34232 Retinal artery branch occlusion, left eye: Secondary | ICD-10-CM | POA: Diagnosis not present

## 2023-10-04 DIAGNOSIS — E1165 Type 2 diabetes mellitus with hyperglycemia: Secondary | ICD-10-CM | POA: Diagnosis not present

## 2023-10-04 DIAGNOSIS — E039 Hypothyroidism, unspecified: Secondary | ICD-10-CM | POA: Diagnosis not present

## 2023-10-04 DIAGNOSIS — I739 Peripheral vascular disease, unspecified: Secondary | ICD-10-CM | POA: Diagnosis not present

## 2023-12-05 DIAGNOSIS — E113411 Type 2 diabetes mellitus with severe nonproliferative diabetic retinopathy with macular edema, right eye: Secondary | ICD-10-CM | POA: Diagnosis not present

## 2023-12-11 DIAGNOSIS — Z1231 Encounter for screening mammogram for malignant neoplasm of breast: Secondary | ICD-10-CM | POA: Diagnosis not present

## 2023-12-12 DIAGNOSIS — Z1231 Encounter for screening mammogram for malignant neoplasm of breast: Secondary | ICD-10-CM | POA: Diagnosis not present

## 2023-12-18 DIAGNOSIS — H524 Presbyopia: Secondary | ICD-10-CM | POA: Diagnosis not present

## 2024-01-07 DIAGNOSIS — I739 Peripheral vascular disease, unspecified: Secondary | ICD-10-CM | POA: Diagnosis not present

## 2024-01-07 DIAGNOSIS — Z0000 Encounter for general adult medical examination without abnormal findings: Secondary | ICD-10-CM | POA: Diagnosis not present

## 2024-01-07 DIAGNOSIS — E785 Hyperlipidemia, unspecified: Secondary | ICD-10-CM | POA: Diagnosis not present

## 2024-01-07 DIAGNOSIS — Z139 Encounter for screening, unspecified: Secondary | ICD-10-CM | POA: Diagnosis not present

## 2024-01-07 DIAGNOSIS — Z136 Encounter for screening for cardiovascular disorders: Secondary | ICD-10-CM | POA: Diagnosis not present

## 2024-01-07 DIAGNOSIS — L719 Rosacea, unspecified: Secondary | ICD-10-CM | POA: Diagnosis not present

## 2024-01-14 DIAGNOSIS — H6121 Impacted cerumen, right ear: Secondary | ICD-10-CM | POA: Diagnosis not present

## 2024-01-21 DIAGNOSIS — I739 Peripheral vascular disease, unspecified: Secondary | ICD-10-CM | POA: Diagnosis not present

## 2024-01-30 DIAGNOSIS — R6889 Other general symptoms and signs: Secondary | ICD-10-CM | POA: Diagnosis not present

## 2024-01-30 DIAGNOSIS — L719 Rosacea, unspecified: Secondary | ICD-10-CM | POA: Diagnosis not present

## 2024-02-12 DIAGNOSIS — H26492 Other secondary cataract, left eye: Secondary | ICD-10-CM | POA: Diagnosis not present

## 2024-03-18 ENCOUNTER — Other Ambulatory Visit: Payer: Self-pay

## 2024-03-18 DIAGNOSIS — I70213 Atherosclerosis of native arteries of extremities with intermittent claudication, bilateral legs: Secondary | ICD-10-CM

## 2024-03-26 NOTE — Progress Notes (Unsigned)
 Patient ID: Sara [name], female   DOB: [birth year], 78 y.o.   MRN: [id number]  Reason for Consult: No chief complaint on file.   Referred by Elyce Hams, Marguerita Shih, *  Subjective:  HPI Sara [name] is a 78 y.o. 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/2022   DM (diabetes mellitus) (HCC)    DOE (dyspnea on exertion) 05/24/2022   Essential hypertension 04/25/2022   Hyperglycemia due to type 2 diabetes mellitus (HCC) 05/24/2022   Hypertension with renal disease    Hypothyroidism    Long term (current) use of insulin (HCC)    Menopause 05/24/2022   Mixed hyperlipidemia    Morbid obesity due to excess calories (HCC)    Obesity (BMI 35.0-39.9 without comorbidity) 05/24/2022   Postmenopausal bleeding 05/30/2022   PVD (peripheral vascular disease) (HCC)    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) 05/24/2022   Stroke (HCC) 05/24/2022   Transient arterial retinal occlusion 05/24/2022   Type 2 diabetes mellitus with hyperglycemia (HCC)    Type 2 diabetes mellitus with other specified complication (HCC) 05/24/2022   Type 2 diabetes mellitus with peripheral angiopathy (HCC) 05/24/2022   Family History  Problem Relation Age of Onset   Heart disease Mother    Diabetes Brother    Diabetes Maternal Uncle    Colon cancer Neg Hx    Esophageal cancer Neg Hx    Stomach cancer Neg Hx    Rectal cancer Neg Hx    Past Surgical History:  Procedure Laterality Date   APPENDECTOMY     CATARACT EXTRACTION, BILATERAL      CESAREAN SECTION     polypectomy  2023   Uterine   TUBAL LIGATION      Short Social History:  Social History   Tobacco Use   Smoking status: Former    Current packs/day: 0.00    Types: Cigarettes    Quit date: 10/01/2008    Years since quitting: 15.4   Smokeless tobacco: Never  Substance Use Topics   Alcohol use: Yes    Comment: occasional    No Known Allergies  Current Outpatient Medications  Medication Sig Dispense Refill   amLODipine (NORVASC) 10 MG tablet Take 10 mg by mouth daily.  4   aspirin EC 81 MG tablet Take 81 mg by mouth daily. Swallow whole.     atorvastatin (LIPITOR) 20 MG tablet Take 20 mg by mouth daily.     EUTHYROX 50 MCG tablet Take 50 mcg by mouth daily.     losartan-hydrochlorothiazide (HYZAAR) 50-12.5 MG tablet Take 1 tablet by mouth daily.     metFORMIN (GLUCOPHAGE) 1000 MG tablet Take 1,000 mg by mouth daily with breakfast.     NOVOLOG MIX 70/30 FLEXPEN (70-30) 100 UNIT/ML FlexPen INJECT 25 UNITS SUBCUTANEOUSLY IN THE MORNING THEN INJECT 5 UNITS AT LUNCH AND 40 UNITS IN THE EVENING. TITRATE TO MMD 100 UNITS  6   No current facility-administered medications for this visit.    REVIEW OF SYSTEMS  All other  systems reviewed and are negative  Objective:  Objective   There were no vitals filed for this visit. There is no height or weight on file to calculate BMI.  Physical Exam General: no acute distress Cardiac: hemodynamically stable Pulm: normal work of breathing Abdomen: non-tender, no pulsatile mass*** Neuro: alert, no focal deficit Extremities: no edema, cyanosis or wounds*** Vascular:   Right: ***  Left: ***  Data: ABI ***     Assessment/Plan:     Sara [name] is a 78 y.o. female with ***  Recommendations to optimize cardiovascular risk: Abstinence from all tobacco products. Blood glucose control with goal A1c < 7%. Blood pressure control with goal blood pressure < 140/90 mmHg. Lipid reduction therapy with goal LDL-C  <100 mg/dL  Aspirin 81mg  PO QD.  Atorvastatin 40-80mg  PO QD (or other "high intensity" statin therapy).     Philipp Brawn MD [facility]

## 2024-03-27 ENCOUNTER — Ambulatory Visit: Attending: Vascular Surgery | Admitting: Vascular Surgery

## 2024-03-27 ENCOUNTER — Ambulatory Visit (HOSPITAL_COMMUNITY)
Admission: RE | Admit: 2024-03-27 | Discharge: 2024-03-27 | Disposition: A | Discharge status: Home / Self Care | Source: Ambulatory Visit | Attending: Vascular Surgery | Admitting: Vascular Surgery

## 2024-03-27 ENCOUNTER — Encounter: Payer: Self-pay | Admitting: Vascular Surgery

## 2024-03-27 ENCOUNTER — Other Ambulatory Visit: Payer: Self-pay

## 2024-03-27 VITALS — BP 125/60 | HR 69 | Temp 98.3°F | Resp 20 | Ht 61.0 in | Wt 210.0 lb

## 2024-03-27 DIAGNOSIS — I739 Peripheral vascular disease, unspecified: Secondary | ICD-10-CM

## 2024-03-27 DIAGNOSIS — I70213 Atherosclerosis of native arteries of extremities with intermittent claudication, bilateral legs: Secondary | ICD-10-CM

## 2024-03-27 LAB — VAS US ABI WITH/WO TBI
Left ABI: 1.03
Right ABI: 0.42

## 2024-04-01 ENCOUNTER — Other Ambulatory Visit: Payer: Self-pay | Admitting: *Deleted

## 2024-04-01 DIAGNOSIS — I739 Peripheral vascular disease, unspecified: Secondary | ICD-10-CM

## 2024-04-16 ENCOUNTER — Ambulatory Visit (INDEPENDENT_AMBULATORY_CARE_PROVIDER_SITE_OTHER)
Admission: RE | Admit: 2024-04-16 | Discharge: 2024-04-16 | Disposition: A | Discharge status: Home / Self Care | Source: Ambulatory Visit | Attending: Vascular Surgery | Admitting: Vascular Surgery

## 2024-04-16 DIAGNOSIS — I70213 Atherosclerosis of native arteries of extremities with intermittent claudication, bilateral legs: Secondary | ICD-10-CM

## 2024-04-16 DIAGNOSIS — N2889 Other specified disorders of kidney and ureter: Secondary | ICD-10-CM | POA: Diagnosis not present

## 2024-04-16 DIAGNOSIS — I700 Atherosclerosis of aorta: Secondary | ICD-10-CM | POA: Diagnosis not present

## 2024-04-16 DIAGNOSIS — I70211 Atherosclerosis of native arteries of extremities with intermittent claudication, right leg: Secondary | ICD-10-CM | POA: Diagnosis not present

## 2024-04-16 DIAGNOSIS — R160 Hepatomegaly, not elsewhere classified: Secondary | ICD-10-CM | POA: Diagnosis not present

## 2024-04-16 LAB — I-STAT CREATININE (MANUAL ENTRY): Creatinine, Ser: 0.8 (ref 0.50–1.10)

## 2024-04-16 MED ORDER — IOPAMIDOL (ISOVUE-370) INJECTION 76%: 125.0000 mL | Freq: Once | INTRAVENOUS | Status: DC | PRN

## 2024-04-16 MED ORDER — IOHEXOL 350 MG/ML SOLN
125.0000 mL | Freq: Once | INTRAVENOUS | Status: AC | PRN
  Administered 2024-04-16: 125 mL via INTRAVENOUS

## 2024-04-23 DIAGNOSIS — E1165 Type 2 diabetes mellitus with hyperglycemia: Secondary | ICD-10-CM | POA: Diagnosis not present

## 2024-04-23 DIAGNOSIS — I10 Essential (primary) hypertension: Secondary | ICD-10-CM | POA: Diagnosis not present

## 2024-04-23 DIAGNOSIS — E039 Hypothyroidism, unspecified: Secondary | ICD-10-CM | POA: Diagnosis not present

## 2024-04-23 DIAGNOSIS — E782 Mixed hyperlipidemia: Secondary | ICD-10-CM | POA: Diagnosis not present

## 2024-04-23 DIAGNOSIS — Z794 Long term (current) use of insulin: Secondary | ICD-10-CM | POA: Diagnosis not present

## 2024-04-23 DIAGNOSIS — I129 Hypertensive chronic kidney disease with stage 1 through stage 4 chronic kidney disease, or unspecified chronic kidney disease: Secondary | ICD-10-CM | POA: Diagnosis not present

## 2024-04-23 DIAGNOSIS — I739 Peripheral vascular disease, unspecified: Secondary | ICD-10-CM | POA: Diagnosis not present

## 2024-04-30 NOTE — Progress Notes (Addendum)
 Patient ID: Sara [name], female   DOB: 1946/06/20, 78 y.o.   MRN: [id number]  Reason for Consult: Follow-up   Referred by Verdia Lombard, MD  Subjective:     HPI Sara [name] is a 78 y.o. female presenting for follow-up.  She was seen about 4 weeks ago was noted to have right calf claudication.  She did not have rest pain or slow/nonhealing wounds at that time.  I was unable to palpate a right femoral pulse and therefore we planned for short interval follow-up with a CTA abdomen pelvis.  She presents today to discuss those results. Today she reports She continues to have right calf claudication although this is not lifestyle limiting. She defines rest pain or slow/nonhealing wounds.   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/2022   DM (diabetes mellitus) (HCC)    DOE (dyspnea on exertion) 05/24/2022   Essential hypertension 04/25/2022   Hyperglycemia due to type 2 diabetes mellitus (HCC) 05/24/2022   Hypertension with renal disease    Hypothyroidism    Long term (current) use of insulin (HCC)    Menopause 05/24/2022   Mixed hyperlipidemia    Morbid obesity due to excess calories (HCC)    Obesity (BMI 35.0-39.9 without comorbidity) 05/24/2022   Postmenopausal bleeding 05/30/2022   PVD (peripheral vascular disease) (HCC)    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) 05/24/2022   Stroke (HCC) 05/24/2022   Transient arterial retinal occlusion 05/24/2022   Type 2 diabetes mellitus with hyperglycemia (HCC)    Type 2  diabetes mellitus with other specified complication (HCC) 05/24/2022   Type 2 diabetes mellitus with peripheral angiopathy (HCC) 05/24/2022   Family History  Problem Relation Age of Onset   Heart disease Mother    Diabetes Brother    Diabetes Maternal Uncle    Colon cancer Neg Hx    Esophageal cancer Neg Hx    Stomach cancer Neg Hx    Rectal cancer Neg Hx    Past Surgical History:  Procedure Laterality Date   APPENDECTOMY     CATARACT EXTRACTION, BILATERAL     CESAREAN SECTION     polypectomy  2023   Uterine   TUBAL LIGATION      Short Social History:  Social History   Tobacco Use   Smoking status: Former    Current packs/day: 0.00    Types: Cigarettes    Quit date: 10/01/2008    Years since quitting: 15.6   Smokeless tobacco: Never  Substance Use Topics   Alcohol use: Yes    Comment: occasional    No Known Allergies  Current Outpatient Medications  Medication Sig Dispense Refill   amLODipine (NORVASC) 10 MG tablet Take 10 mg by mouth daily.  4   aspirin EC 81 MG tablet Take 81 mg by mouth daily. Swallow whole.     atorvastatin (LIPITOR) 20 MG tablet Take 20 mg by mouth daily.     EUTHYROX 50 MCG tablet Take 50 mcg by mouth daily.     losartan-hydrochlorothiazide (HYZAAR)  50-12.5 MG tablet Take 1 tablet by mouth daily.     metFORMIN (GLUCOPHAGE) 1000 MG tablet Take 1,000 mg by mouth daily with breakfast.     NOVOLOG MIX 70/30 FLEXPEN (70-30) 100 UNIT/ML FlexPen INJECT 25 UNITS SUBCUTANEOUSLY IN THE MORNING THEN INJECT 5 UNITS AT LUNCH AND 40 UNITS IN THE EVENING. TITRATE TO MMD 100 UNITS  6   No current facility-administered medications for this visit.    REVIEW OF SYSTEMS  All other systems were reviewed and are negative     Objective:  Objective   Vitals:   05/01/24 1609  BP: 131/67  Pulse: 69  Resp: 20  Temp: 98.2 F (36.8 C)  TempSrc: Temporal  SpO2: 90%  Weight: 210 lb (95.3 kg)  Height: 5' 1 (1.549 m)   Body mass index is 39.68  kg/m.  Physical Exam General: no acute distress Cardiac: hemodynamically stable Pulm: normal work of breathing Abdomen: non-tender, no pulsatile mass Neuro: alert, no focal deficit Extremities: Some dependent rubor present on the right foot, no edema, cyanosis or wounds Vascular:              Right: Nonpalpable femoral, possibly due to body habitus, nonpalpable pedal pulses             Left: Nonpalpable femoral, possibly due to body habitus, nonpalpable pedal pulses   Data: ABI +---------+------------------+-----+----------+  Right   Rt Pressure (mmHg)IndexWaveform    +---------+------------------+-----+----------+  Brachial 148                                +---------+------------------+-----+----------+  PTA     54                0.36 monophasic  +---------+------------------+-----+----------+  DP      63                0.42 monophasic  +---------+------------------+-----+----------+  Great Toe0                 0.00 Absent      +---------+------------------+-----+----------+   +---------+------------------+-----+--------+  Left    Lt Pressure (mmHg)IndexWaveform  +---------+------------------+-----+--------+  Brachial 151                              +---------+------------------+-----+--------+  PTA     113               0.75 biphasic  +---------+------------------+-----+--------+  DP      156               1.03 biphasic  +---------+------------------+-----+--------+  Great Toe72                0.48 Abnormal  +---------+------------------+-----+--------+   CTA independently reviewed Moderate calcific disease of the infrarenal aorta and common iliac arteries.  The external iliacs appear widely patent. Large bulky calcific plaque in the right common femoral artery, multifocal calcific disease of the SFA.  A bulky calcific plaque in the right popliteal artery.  There appears to be three-vessel runoff bilaterally although  there is some calcific disease throughout the tibials     Assessment/Plan:   Anayansi A Pellot is a 78 y.o. female with peripheral arterial disease and claudication. We did discuss the option of right femoral endarterectomy although I explained that given that she is a claudicant that she is at low risk of future limb loss and therefore  medical management is a reasonable choice as surgical revascularization is not absolutely necessary at this time. Will plan to follow up in 3 months with an ABI and re-discuss her symptoms    Norman GORMAN Serve MD [facility]   Patient called back stating she would like Sheree to proceed with intervention. Will plan for RIGHT femoral endarterectomy.

## 2024-05-01 ENCOUNTER — Encounter: Payer: Self-pay | Admitting: Vascular Surgery

## 2024-05-01 ENCOUNTER — Other Ambulatory Visit: Payer: Self-pay | Admitting: *Deleted

## 2024-05-01 ENCOUNTER — Ambulatory Visit (HOSPITAL_COMMUNITY)
Admission: RE | Admit: 2024-05-01 | Discharge: 2024-05-01 | Disposition: A | Discharge status: Home / Self Care | Source: Ambulatory Visit | Attending: Vascular Surgery | Admitting: Vascular Surgery

## 2024-05-01 ENCOUNTER — Ambulatory Visit: Attending: Vascular Surgery | Admitting: Vascular Surgery

## 2024-05-01 VITALS — BP 131/67 | HR 69 | Temp 98.2°F | Resp 20 | Ht 61.0 in | Wt 210.0 lb

## 2024-05-01 DIAGNOSIS — I739 Peripheral vascular disease, unspecified: Secondary | ICD-10-CM | POA: Insufficient documentation

## 2024-05-01 DIAGNOSIS — I70213 Atherosclerosis of native arteries of extremities with intermittent claudication, bilateral legs: Secondary | ICD-10-CM

## 2024-05-04 ENCOUNTER — Other Ambulatory Visit: Payer: Self-pay

## 2024-05-04 DIAGNOSIS — I739 Peripheral vascular disease, unspecified: Secondary | ICD-10-CM

## 2024-05-12 ENCOUNTER — Telehealth: Payer: Self-pay

## 2024-05-12 NOTE — Telephone Encounter (Signed)
   Pre-operative Risk Assessment    Patient Name: Sara [name]  DOB: 02-Apr-1946 MRN: [id number]   Date of last office visit: 09/14/22 Date of next office visit: n/a   Request for Surgical Clearance    Procedure:  L femoral endarterectomy   Date of Surgery:  Clearance TBD                                 Surgeon:  Dr. Norman Serve  Surgeon's Group or Practice Name:  [facility]  Phone number:  [phone] Fax number:  [phone]   Type of Clearance Requested:   - Medical  - Pharmacy:  Hold Aspirin Not indicated    Type of Anesthesia:  Not Indicated   Additional requests/questions:    [name]   05/12/2024, 11:50 AM

## 2024-05-12 NOTE — Telephone Encounter (Signed)
 Tried to call the pt thru [employer]. Vm is not set up for pt, we could not leave a message to call back to schedule an appt in office for preop clearance.

## 2024-05-12 NOTE — Telephone Encounter (Signed)
   Name: Sara [name]  DOB: 09-06-1946  MRN: [id number]  Primary Cardiologist: None  Chart reviewed as part of pre-operative protocol coverage. Because of Sara [name]'s past medical history and time since last visit, she will require a follow-up in-office visit in order to better assess preoperative cardiovascular risk.  Pre-op covering staff: - Please schedule appointment and call patient to inform them. If patient already had an upcoming appointment within acceptable timeframe, please add pre-op clearance to the appointment notes so provider is aware. - Please contact requesting surgeon's office via preferred method (i.e, phone, fax) to inform them of need for appointment prior to surgery.   Wyn Raddle, Jackee Shove, NP  05/12/2024, 11:54 AM

## 2024-05-12 NOTE — Telephone Encounter (Signed)
 Patient's sis-in-law, Candy, called to inform [facility] that patient would like to go forward with left femoral endarterectomy with Dr. Pearline.  This nurse informed Dr. Pearline of patient wishes and he is requesting patient have cardiac clearance prior to surgery.  Clearance request faxed to [facility] - Dr. Edwyna.

## 2024-05-14 NOTE — Telephone Encounter (Signed)
 2nd Attempt: Tried calling patient using [employer]. No answer and was not able to leave a voicemail.

## 2024-05-18 NOTE — Telephone Encounter (Signed)
 Preop f/u setup for 07/11 with Delon Hoover at 1:55 pm in [place] with [place] @ [facility].  She reports daughter contacted her stating she had not heard from our office. Macario advised she will call daughter to inform her of visit.

## 2024-05-20 DIAGNOSIS — E1142 Type 2 diabetes mellitus with diabetic polyneuropathy: Secondary | ICD-10-CM | POA: Diagnosis not present

## 2024-05-20 DIAGNOSIS — E1169 Type 2 diabetes mellitus with other specified complication: Secondary | ICD-10-CM | POA: Diagnosis not present

## 2024-05-20 DIAGNOSIS — E1129 Type 2 diabetes mellitus with other diabetic kidney complication: Secondary | ICD-10-CM | POA: Diagnosis not present

## 2024-05-20 DIAGNOSIS — I739 Peripheral vascular disease, unspecified: Secondary | ICD-10-CM | POA: Diagnosis not present

## 2024-05-20 DIAGNOSIS — R809 Proteinuria, unspecified: Secondary | ICD-10-CM | POA: Diagnosis not present

## 2024-05-21 NOTE — Progress Notes (Signed)
 Cardiology Office Note   Date:  05/22/2024  ID:  Sara [name], DOB Jan 08, 1946, MRN [id number] PCP: Verdia Lombard, MD  [facility] Providers Cardiologist:  Damire Remedios Sara Crape, MD Cardiology APP:  Carlin Delon BROCKS, NP     History of Present Illness Sara [name] is a 78 y.o. female with a past medical history of first-degree AV block, SVT, DM2, dyslipidemia, hypertension, PAD, prior tobacco abuse.  06/14/2022 Lexiscan  normal, low risk 06/14/2022 echo EF 60 to 65%, grade 1 DD 04/12/2022 monitor average heart rate 73 bpm, predominant rhythm was sinus, first-degree AV block, 4 episodes of SVT  She initially established care with Dr. Crape in 2023 for the evaluation of dyspnea on exertion.  She underwent a stress evaluation which was an overall low risk normal exam, she also had an echocardiogram revealing a normal EF.  Most recently evaluated by Dr. Crape on 09/14/2022, she was stable from a cardiac perspective, no changes were made to her medications or plan of care and she was advised she can follow-up in 9 months.  She was evaluated by the vein and vascular specialist in [facility] in May 2025 for calf pain, ultimately was recommended to maximize medical therapy including walking, good blood pressure and blood sugar control.  She presents today for preoperative evaluation for upcoming femoral endarterectomy, she states on her right leg but preop request was for left leg. She stays active in her home, she completes all chores in her home without any chest pain, shortness of breath, palpitations, dizziness, or syncope.    ROS: Review of Systems  Musculoskeletal:        RLL pain  All other systems reviewed and are negative.    Studies Reviewed EKG Interpretation Date/Time:  Friday May 22 2024 13:49:10 EDT Ventricular Rate:  74 PR Interval:  174 QRS Duration:  90 QT Interval:  384 QTC Calculation: 426 R Axis:   49  Text Interpretation: Normal sinus rhythm  Abnormal ECG No previous ECGs available Confirmed by Carlin Delon [phone]) on 05/22/2024 1:55:51 PM    Cardiac Studies & Procedures   ______________________________________________________________________________________________   STRESS TESTS  MYOCARDIAL PERFUSION IMAGING 06/14/2022  Interpretation Summary   Findings are consistent with no ischemia and no prior myocardial infarction. The study is low risk.   No ST deviation was noted.   Left ventricular function is normal. Nuclear stress EF: 61 %. The left ventricular ejection fraction is normal (55-65%). End diastolic cavity size is normal.   Prior study not available for comparison.   ECHOCARDIOGRAM  ECHOCARDIOGRAM COMPLETE 06/14/2022  Narrative ECHOCARDIOGRAM REPORT    Patient Name:   Sara [name] Date of Exam: 06/14/2022 Medical Rec #:  [id number]         Height:       62.0 in Accession #:    [id number]        Weight:       208.4 lb Date of Birth:  01-16-1946         BSA:          1.945 m Patient Age:    75 years          BP:           116/67 mmHg Patient Gender: F                 HR:           69 bpm. Exam Location:  [place]  Procedure: 2D Echo, Cardiac Doppler, Color  Doppler, Strain Analysis and Intracardiac Opacification Agent  Indications:    Cardiac murmur [R01.1 (ICD-10-CM)]  History:        Patient has no prior history of Echocardiogram examinations. Signs/Symptoms:Dyspnea and Obesity; Risk Factors:Dyslipidemia, Diabetes and Hypertension.  Sonographer:    Lynwood Silvas RDCS Referring Phys: CYRUS Rondle Lohse [facility]  IMPRESSIONS   1. Left ventricular ejection fraction, by estimation, is 60 to 65%. The left ventricle has normal function. The left ventricle has no regional wall motion abnormalities. Left ventricular diastolic parameters are consistent with Grade I diastolic dysfunction (impaired relaxation). 2. Right ventricular systolic function is normal. The right ventricular size is normal. There is normal  pulmonary artery systolic pressure. 3. The mitral valve is normal in structure. No evidence of mitral valve regurgitation. No evidence of mitral stenosis. 4. The aortic valve is normal in structure. Aortic valve regurgitation is mild. No aortic stenosis is present. 5. The inferior vena cava is normal in size with greater than 50% respiratory variability, suggesting right atrial pressure of 3 mmHg.  FINDINGS Left Ventricle: Left ventricular ejection fraction, by estimation, is 60 to 65%. The left ventricle has normal function. The left ventricle has no regional wall motion abnormalities. Definity  contrast agent was given IV to delineate the left ventricular endocardial borders. The left ventricular internal cavity size was normal in size. There is no left ventricular hypertrophy. Left ventricular diastolic parameters are consistent with Grade I diastolic dysfunction (impaired relaxation).  Right Ventricle: The right ventricular size is normal. No increase in right ventricular wall thickness. Right ventricular systolic function is normal. There is normal pulmonary artery systolic pressure. The tricuspid regurgitant velocity is 2.15 m/s, and with an assumed right atrial pressure of 3 mmHg, the estimated right ventricular systolic pressure is 21.5 mmHg.  Left Atrium: Left atrial size was normal in size.  Right Atrium: Right atrial size was normal in size.  Pericardium: There is no evidence of pericardial effusion.  Mitral Valve: The mitral valve is normal in structure. No evidence of mitral valve regurgitation. No evidence of mitral valve stenosis.  Tricuspid Valve: The tricuspid valve is normal in structure. Tricuspid valve regurgitation is trivial. No evidence of tricuspid stenosis.  Aortic Valve: The aortic valve is normal in structure. Aortic valve regurgitation is mild. Aortic regurgitation PHT measures 488 msec. No aortic stenosis is present.  Pulmonic Valve: The pulmonic valve was normal  in structure. Pulmonic valve regurgitation is not visualized. No evidence of pulmonic stenosis.  Aorta: The aortic root is normal in size and structure.  Venous: The inferior vena cava is normal in size with greater than 50% respiratory variability, suggesting right atrial pressure of 3 mmHg.  IAS/Shunts: No atrial level shunt detected by color flow Doppler.   LEFT VENTRICLE PLAX 2D LVIDd:         5.10 cm   Diastology LVIDs:         3.50 cm   LV e' medial:    2.78 cm/s LV PW:         1.00 cm   LV E/e' medial:  33.0 LV IVS:        1.00 cm   LV e' lateral:   3.83 cm/s LVOT diam:     1.90 cm   LV E/e' lateral: 23.9 LV SV:         71 LV SV Index:   36 LVOT Area:     2.84 cm   RIGHT VENTRICLE  IVC RV S prime:     9.45 cm/s  IVC diam: 1.70 cm TAPSE (M-mode): 2.4 cm  LEFT ATRIUM             Index        RIGHT ATRIUM           Index LA diam:        3.50 cm 1.80 cm/m   RA Area:     13.00 cm LA Vol (A2C):   54.7 ml 28.12 ml/m  RA Volume:   33.70 ml  17.32 ml/m LA Vol (A4C):   32.6 ml 16.76 ml/m LA Biplane Vol: 43.8 ml 22.51 ml/m AORTIC VALVE LVOT Vmax:   98.60 cm/s LVOT Vmean:  70.300 cm/s LVOT VTI:    0.249 m AI PHT:      488 msec  AORTA Ao Root diam: 3.10 cm Ao Asc diam:  3.70 cm Ao Desc diam: 2.00 cm  MITRAL VALVE                TRICUSPID VALVE MV Area (PHT): 2.96 cm     TR Peak grad:   18.5 mmHg MV Decel Time: 256 msec     TR Vmax:        215.00 cm/s MV E velocity: 91.70 cm/s MV A velocity: 114.00 cm/s  SHUNTS MV E/A ratio:  0.80         Systemic VTI:  0.25 m Systemic Diam: 1.90 cm  Lamar Fitch MD Electronically signed by Lamar Fitch MD Signature Date/Time: 06/14/2022/7:00:57 PM    Final    MONITORS  LONG TERM MONITOR (3-14 DAYS) 04/12/2022       ______________________________________________________________________________________________      Risk Assessment/Calculations           Physical Exam VS:  BP 122/70   Pulse 74    Ht 5' 2 (1.575 m)   Wt 205 lb 6.4 oz (93.2 kg)   SpO2 90%   BMI 37.57 kg/m        Wt Readings from Last 3 Encounters:  05/22/24 205 lb 6.4 oz (93.2 kg)  05/01/24 210 lb (95.3 kg)  03/27/24 210 lb (95.3 kg)    GEN: Well nourished, well developed in no acute distress NECK: No JVD; No carotid bruits CARDIAC: RRR, no murmurs, rubs, gallops RESPIRATORY:  Clear to auscultation without rales, wheezing or rhonchi  ABDOMEN: Soft, non-tender, non-distended EXTREMITIES:  No edema; No deformity   ASSESSMENT AND PLAN Hypertension -her blood pressure is well-controlled today 122/70, continue Hyzaar 50-12.5 mg 2 tablets once daily.  Dyslipidemia -most recent LDL was well-controlled at 35 in February 2025, this monitored formally by her PCP, continue Lipitor 40 mg daily.  PAD -she is following with Dr. Pearline at [facility], currently on aspirin 81 mg daily and Lipitor 40 mg daily.  We got a preoperative request for upcoming left femoral endarterectomy however she advised it was her right leg they plan to do the procedure on.  DM2 -she is on metformin 1000 mg daily, as well as NovoLog, most recent A1c was uncontrolled at 8.2%.    Preoperative cardiovascular evaluation- According to the Revised Cardiac Risk Index (RCRI), her Perioperative Risk of Major Cardiac Event is (%): 0.9 Her Functional Capacity in METs is: 6.05 according to the [facility] Activity Status Index (DASI).  Therefore, based on ACC/AHA guidelines, patient would be at acceptable risk for the planned procedure without further cardiovascular testing. I will route this recommendation to the requesting party via Epic fax function.  Regarding her aspirin, this can be held per vascular surgeon recommendations, it appears she is on it for her PAD.     Dispo: Follow up in 1 year.   Signed, Delon JAYSON Hoover, NP

## 2024-05-22 ENCOUNTER — Ambulatory Visit: Attending: Cardiology | Admitting: Cardiology

## 2024-05-22 ENCOUNTER — Encounter: Payer: Self-pay | Admitting: Cardiology

## 2024-05-22 VITALS — BP 122/70 | HR 74 | Ht 62.0 in | Wt 205.4 lb

## 2024-05-22 DIAGNOSIS — E1169 Type 2 diabetes mellitus with other specified complication: Secondary | ICD-10-CM

## 2024-05-22 DIAGNOSIS — E782 Mixed hyperlipidemia: Secondary | ICD-10-CM

## 2024-05-22 DIAGNOSIS — I10 Essential (primary) hypertension: Secondary | ICD-10-CM | POA: Diagnosis not present

## 2024-05-22 DIAGNOSIS — I739 Peripheral vascular disease, unspecified: Secondary | ICD-10-CM

## 2024-05-22 DIAGNOSIS — Z01818 Encounter for other preprocedural examination: Secondary | ICD-10-CM

## 2024-05-22 NOTE — Patient Instructions (Addendum)
Medication Instructions:  Your physician recommends that you continue on your current medications as directed. Please refer to the Current Medication list given to you today.  *If you need a refill on your cardiac medications before your next appointment, please call your pharmacy*   Lab Work: None Ordered If you have labs (blood work) drawn today and your tests are completely normal, you will receive your results only by: MyChart Message (if you have MyChart) OR A paper copy in the mail If you have any lab test that is abnormal or we need to change your treatment, we will call you to review the results.   Testing/Procedures: None Ordered   Follow-Up: At [facility], you and your health needs are our priority.  As part of our continuing mission to provide you with exceptional heart care, we have created designated Provider Care Teams.  These Care Teams include your primary Cardiologist (physician) and Advanced Practice Providers (APPs -  Physician Assistants and Nurse Practitioners) who all work together to provide you with the care you need, when you need it.  We recommend signing up for the patient portal called "MyChart".  Sign up information is provided on this After Visit Summary.  MyChart is used to connect with patients for Virtual Visits (Telemedicine).  Patients are able to view lab/test results, encounter notes, upcoming appointments, etc.  Non-urgent messages can be sent to your provider as well.   To learn more about what you can do with MyChart, go to [email or web address].    Your next appointment:   12 month(s)  The format for your next appointment:   In Person  Provider:   Wallis Bamberg, NP   Other Instructions NA

## 2024-05-26 ENCOUNTER — Other Ambulatory Visit: Payer: Self-pay

## 2024-05-26 DIAGNOSIS — I70213 Atherosclerosis of native arteries of extremities with intermittent claudication, bilateral legs: Secondary | ICD-10-CM

## 2024-05-26 DIAGNOSIS — I739 Peripheral vascular disease, unspecified: Secondary | ICD-10-CM

## 2024-06-09 NOTE — Progress Notes (Signed)
 Surgical Instructions   Your procedure is scheduled on Tuesday August 5. Report to [facility] Main Entrance A at 7:30 A.M., then check in with the Admitting office. Any questions or running late day of surgery: call [phone]  Questions prior to your surgery date: call [phone], Monday-Friday, 8am-4pm. If you experience any cold or flu symptoms such as cough, fever, chills, shortness of breath, etc. between now and your scheduled surgery, please notify us  at the above number.     Remember:  Do not eat or drink anything after midnight the night before your surgery   Take these medicines the morning of surgery with A SIP OF WATER  aspirin EC  atorvastatin (LIPITOR)   May take these medicines IF NEEDED:none  One week prior to surgery, STOP taking any Aspirin (unless otherwise instructed by your surgeon) Aleve, Naproxen, Ibuprofen, Motrin, Advil, Goody's, BC's, all herbal medications, fish oil, and non-prescription vitamins.          WHAT DO I DO ABOUT MY DIABETES MEDICATION?   THE EVENING BEFORE SURGERY, take 28 units of NOVOLOG MIX 70/30 insulin. This is 70% of your normal dose.      THE MORNING OF SURGERY, DO NOT TAKE NOVOLOG MIX 70/30 insulin.    HOW TO MANAGE YOUR DIABETES BEFORE AND AFTER SURGERY  Why is it important to control my blood sugar before and after surgery? Improving blood sugar levels before and after surgery helps healing and can limit problems. A way of improving blood sugar control is eating a healthy diet by:  Eating less sugar and carbohydrates  Increasing activity/exercise  Talking with your doctor about reaching your blood sugar goals High blood sugars (greater than 180 mg/dL) can raise your risk of infections and slow your recovery, so you will need to focus on controlling your diabetes during the weeks before surgery. Make sure that the doctor who takes care of your diabetes knows about your planned surgery including the date and  location.  How do I manage my blood sugar before surgery? Check your blood sugar at least 4 times a day, starting 2 days before surgery, to make sure that the level is not too high or low.  Check your blood sugar the morning of your surgery when you wake up and every 2 hours until you get to the Short Stay unit.  If your blood sugar is less than 70 mg/dL, you will need to treat for low blood sugar: Do not take insulin. Treat a low blood sugar (less than 70 mg/dL) with  cup of clear juice (cranberry or apple), 4 glucose tablets, OR glucose gel. Recheck blood sugar in 15 minutes after treatment (to make sure it is greater than 70 mg/dL). If your blood sugar is not greater than 70 mg/dL on recheck, call [phone] for further instructions. Report your blood sugar to the short stay nurse when you get to Short Stay.  If you are admitted to the hospital after surgery: Your blood sugar will be checked by the staff and you will probably be given insulin after surgery (instead of oral diabetes medicines) to make sure you have good blood sugar levels. The goal for blood sugar control after surgery is 80-180 mg/dL.           Do NOT Smoke (Tobacco/Vaping) for 24 hours prior to your procedure.  If you use a CPAP at night, you may bring your mask/headgear for your overnight stay.   You will be asked to remove any contacts, glasses,  piercing's, hearing aid's, dentures/partials prior to surgery. Please bring cases for these items if needed.    Patients discharged the day of surgery will not be allowed to drive home, and someone needs to stay with them for 24 hours.  SURGICAL WAITING ROOM VISITATION Patients may have no more than 2 support people in the waiting area - these visitors may rotate.   Pre-op nurse will coordinate an appropriate time for 1 ADULT support person, who may not rotate, to accompany patient in pre-op.  Children under the age of 68 must have an adult with them who is not the  patient and must remain in the main waiting area with an adult.  If the patient needs to stay at the hospital during part of their recovery, the visitor guidelines for inpatient rooms apply.  Please refer to the [facility] website for the visitor guidelines for any additional information.   If you received a COVID test during your pre-op visit  it is requested that you wear a mask when out in public, stay away from anyone that may not be feeling well and notify your surgeon if you develop symptoms. If you have been in contact with anyone that has tested positive in the last 10 days please notify you surgeon.      Pre-operative CHG Bathing Instructions   You can play a key role in reducing the risk of infection after surgery. Your skin needs to be as free of germs as possible. You can reduce the number of germs on your skin by washing with CHG (chlorhexidine gluconate) soap before surgery. CHG is an antiseptic soap that kills germs and continues to kill germs even after washing.   DO NOT use if you have an allergy to chlorhexidine/CHG or antibacterial soaps. If your skin becomes reddened or irritated, stop using the CHG and notify one of our RNs at [phone].              TAKE A SHOWER THE NIGHT BEFORE SURGERY AND THE DAY OF SURGERY    Please keep in mind the following:  DO NOT shave, including legs and underarms, 48 hours prior to surgery.   You may shave your face before/day of surgery.  Place clean sheets on your bed the night before surgery Use a clean washcloth (not used since being washed) for each shower. DO NOT sleep with pet's night before surgery.  CHG Shower Instructions:  Wash your face and private area with normal soap. If you choose to wash your hair, wash first with your normal shampoo.  After you use shampoo/soap, rinse your hair and body thoroughly to remove shampoo/soap residue.  Turn the water OFF and apply half the bottle of CHG soap to a CLEAN washcloth.  Apply  CHG soap ONLY FROM YOUR NECK DOWN TO YOUR TOES (washing for 3-5 minutes)  DO NOT use CHG soap on face, private areas, open wounds, or sores.  Pay special attention to the area where your surgery is being performed.  If you are having back surgery, having someone wash your back for you may be helpful. Wait 2 minutes after CHG soap is applied, then you may rinse off the CHG soap.  Pat dry with a clean towel  Put on clean pajamas    Additional instructions for the day of surgery: DO NOT APPLY any lotions, deodorants, cologne, or perfumes.   Do not wear jewelry or makeup Do not wear nail polish, gel polish, artificial nails, or any other type  of covering on natural nails (fingers and toes) Do not bring valuables to the hospital. [facility] is not responsible for valuables/personal belongings. Put on clean/comfortable clothes.  Please brush your teeth.  Ask your nurse before applying any prescription medications to the skin.

## 2024-06-10 ENCOUNTER — Encounter (HOSPITAL_COMMUNITY): Payer: Self-pay

## 2024-06-10 ENCOUNTER — Other Ambulatory Visit: Payer: Self-pay

## 2024-06-10 ENCOUNTER — Encounter (HOSPITAL_COMMUNITY)
Admission: RE | Admit: 2024-06-10 | Discharge: 2024-06-10 | Disposition: A | Discharge status: Home / Self Care | Source: Ambulatory Visit | Attending: Vascular Surgery | Admitting: Vascular Surgery

## 2024-06-10 VITALS — BP 116/73 | HR 76 | Temp 98.4°F | Resp 18 | Ht 62.0 in | Wt 203.7 lb

## 2024-06-10 DIAGNOSIS — Z01818 Encounter for other preprocedural examination: Secondary | ICD-10-CM

## 2024-06-10 DIAGNOSIS — Z01812 Encounter for preprocedural laboratory examination: Secondary | ICD-10-CM | POA: Diagnosis not present

## 2024-06-10 DIAGNOSIS — I70213 Atherosclerosis of native arteries of extremities with intermittent claudication, bilateral legs: Secondary | ICD-10-CM | POA: Diagnosis not present

## 2024-06-10 DIAGNOSIS — I739 Peripheral vascular disease, unspecified: Secondary | ICD-10-CM

## 2024-06-10 LAB — COMPREHENSIVE METABOLIC PANEL WITH GFR
ALT: 20 U/L (ref 0–44)
AST: 19 U/L (ref 15–41)
Albumin: 3.6 g/dL (ref 3.5–5.0)
Alkaline Phosphatase: 112 U/L (ref 38–126)
Anion gap: 9 (ref 5–15)
BUN: 15 mg/dL (ref 8–23)
CO2: 32 mmol/L (ref 22–32)
Calcium: 8.5 mg/dL — ABNORMAL LOW (ref 8.9–10.3)
Chloride: 96 mmol/L — ABNORMAL LOW (ref 98–111)
Creatinine, Ser: 0.9 mg/dL (ref 0.44–1.00)
GFR, Estimated: >60 mL/min (ref >60)
Glucose, Bld: 223 mg/dL — ABNORMAL HIGH (ref 70–99)
Potassium: 3.7 mmol/L (ref 3.5–5.1)
Sodium: 137 mmol/L (ref 135–145)
Total Bilirubin: 1.2 mg/dL (ref 0.0–1.2)
Total Protein: 7.5 g/dL (ref 6.5–8.1)

## 2024-06-10 LAB — URINALYSIS, ROUTINE W REFLEX MICROSCOPIC
Bilirubin Urine: NEGATIVE
Glucose, UA: NEGATIVE mg/dL
Hgb urine dipstick: NEGATIVE
Ketones, ur: NEGATIVE mg/dL
Leukocytes,Ua: NEGATIVE
Nitrite: NEGATIVE
Protein, ur: NEGATIVE mg/dL
Specific Gravity, Urine: 1.012 (ref 1.005–1.030)
pH: 6 (ref 5.0–8.0)

## 2024-06-10 LAB — TYPE AND SCREEN
ABO/RH(D): A POS
Antibody Screen: NEGATIVE

## 2024-06-10 LAB — PROTIME-INR
INR: 1 (ref 0.8–1.2)
Prothrombin Time: 14.1 s (ref 11.4–15.2)

## 2024-06-10 LAB — SURGICAL PCR SCREEN
MRSA, PCR: NEGATIVE
Staphylococcus aureus: NEGATIVE

## 2024-06-10 LAB — CBC
HCT: 46.6 % — ABNORMAL HIGH (ref 36.0–46.0)
Hemoglobin: 14.7 g/dL (ref 12.0–15.0)
MCH: 27.1 pg (ref 26.0–34.0)
MCHC: 31.5 g/dL (ref 30.0–36.0)
MCV: 85.8 fL (ref 80.0–100.0)
Platelets: 261 K/uL (ref 150–400)
RBC: 5.43 MIL/uL — ABNORMAL HIGH (ref 3.87–5.11)
RDW: 18.7 % — ABNORMAL HIGH (ref 11.5–15.5)
WBC: 9.3 K/uL (ref 4.0–10.5)
nRBC: 0 % (ref 0.0–0.2)

## 2024-06-10 LAB — GLUCOSE, CAPILLARY: Glucose-Capillary: 274 mg/dL — ABNORMAL HIGH (ref 70–99)

## 2024-06-10 LAB — APTT: aPTT: 32 s (ref 24–36)

## 2024-06-10 NOTE — Progress Notes (Signed)
 PCP - Pt just switched PCP's to Mikel Fila, MD with [facility] (Previous PCP Lequita Flor with [facility]) Cardiologist - Dr. Jennifer Crape - last office visit 05/22/2024 with NP Endocrinologist - Dr. Littie Caffey  PPM/ICD - Denies Device Orders - n/a Rep Notified - n/a  Chest x-ray - n/a EKG - 05/22/2024 Stress Test - 06/14/2022 ECHO - 06/14/2022 Cardiac Cath - Denies  Sleep Study - Denies CPAP - n/a  Pt is DM2. She checks her blood sugar a few times per month. Normal fasting glucose 80-140. CBG at pre-op 274 (pt states she had coffee with cream and sugar and carbs today). Last A1c 8.3 on 04/24/2024 (CE). Pt instructed to try to keep her blood sugars within a normal range prior to surgery. If she arrives DOS with increased glucose, she may be cancelled. Pt and daughter understood.   Last dose of GLP1 agonist- n/a GLP1 instructions: n/a  Blood Thinner Instructions: n/a Aspirin Instructions: Pt will continue taking her ASA through the morning of surgery  NPO after midnight  COVID TEST- n/a   Anesthesia review: Yes. Cardiac clearance. Hx of HTN, SVT, heart murmur, CKD.   Patient denies shortness of breath, fever, cough and chest pain at PAT appointment. Pt denies any respiratory illness/infection in the last two months.    All instructions explained to the patient and patients daughter, with a verbal understanding of the material. Patient agrees to go over the instructions while at home for a better understanding. Patient also instructed to self quarantine after being tested for COVID-19. The opportunity to ask questions was provided. Spanish Interpreter present throughout entire appointment.

## 2024-06-10 NOTE — Progress Notes (Signed)
 Surgical Instructions   Your procedure is scheduled on Tuesday August 5. Report to [facility] Main Entrance A at 7:30 A.M., then check in with the Admitting office. Any questions or running late day of surgery: call [phone]  Questions prior to your surgery date: call [phone], Monday-Friday, 8am-4pm. If you experience any cold or flu symptoms such as cough, fever, chills, shortness of breath, etc. between now and your scheduled surgery, please notify us  at the above number.     Remember:  Do not eat or drink anything after midnight the night before your surgery    Take these medicines the morning of surgery with A SIP OF WATER  aspirin EC  atorvastatin (LIPITOR)  EUTHYROX    One week prior to surgery, STOP taking any Aleve, Naproxen, Ibuprofen, Motrin, Advil, Goody's, BC's, all herbal medications, fish oil, and non-prescription vitamins.           WHAT DO I DO ABOUT MY DIABETES MEDICATION?   THE EVENING BEFORE SURGERY, take 35 units of NOVOLOG MIX 70/30 insulin or 70% of your normal dose.  Do not take your metFORMIN (GLUCOPHAGE) the morning of surgery.     THE MORNING OF SURGERY, do not take your insulin NPH-regular Human (NOVOLIN 70/30) insulin.   HOW TO MANAGE YOUR DIABETES BEFORE AND AFTER SURGERY  Why is it important to control my blood sugar before and after surgery? Improving blood sugar levels before and after surgery helps healing and can limit problems. A way of improving blood sugar control is eating a healthy diet by:  Eating less sugar and carbohydrates  Increasing activity/exercise  Talking with your doctor about reaching your blood sugar goals High blood sugars (greater than 180 mg/dL) can raise your risk of infections and slow your recovery, so you will need to focus on controlling your diabetes during the weeks before surgery. Make sure that the doctor who takes care of your diabetes knows about your planned surgery including the date and  location.  How do I manage my blood sugar before surgery? Check your blood sugar at least 4 times a day, starting 2 days before surgery, to make sure that the level is not too high or low.  Check your blood sugar the morning of your surgery when you wake up and every 2 hours until you get to the Short Stay unit.  If your blood sugar is less than 70 mg/dL, you will need to treat for low blood sugar: Do not take insulin. Treat a low blood sugar (less than 70 mg/dL) with  cup of clear juice (cranberry or apple), 4 glucose tablets, OR glucose gel. Recheck blood sugar in 15 minutes after treatment (to make sure it is greater than 70 mg/dL). If your blood sugar is not greater than 70 mg/dL on recheck, call [phone] for further instructions. Report your blood sugar to the short stay nurse when you get to Short Stay.  If you are admitted to the hospital after surgery: Your blood sugar will be checked by the staff and you will probably be given insulin after surgery (instead of oral diabetes medicines) to make sure you have good blood sugar levels. The goal for blood sugar control after surgery is 80-180 mg/dL.           Do NOT Smoke (Tobacco/Vaping) for 24 hours prior to your procedure.  If you use a CPAP at night, you may bring your mask/headgear for your overnight stay.   You will be asked to remove any  contacts, glasses, piercing's, hearing aid's, dentures/partials prior to surgery. Please bring cases for these items if needed.    Patients discharged the day of surgery will not be allowed to drive home, and someone needs to stay with them for 24 hours.  SURGICAL WAITING ROOM VISITATION Patients may have no more than 2 support people in the waiting area - these visitors may rotate.   Pre-op nurse will coordinate an appropriate time for 1 ADULT support person, who may not rotate, to accompany patient in pre-op.  Children under the age of 70 must have an adult with them who is not the  patient and must remain in the main waiting area with an adult.  If the patient needs to stay at the hospital during part of their recovery, the visitor guidelines for inpatient rooms apply.  Please refer to the [facility] website for the visitor guidelines for any additional information.   If you received a COVID test during your pre-op visit  it is requested that you wear a mask when out in public, stay away from anyone that may not be feeling well and notify your surgeon if you develop symptoms. If you have been in contact with anyone that has tested positive in the last 10 days please notify you surgeon.      Pre-operative CHG Bathing Instructions   You can play a key role in reducing the risk of infection after surgery. Your skin needs to be as free of germs as possible. You can reduce the number of germs on your skin by washing with CHG (chlorhexidine gluconate) soap before surgery. CHG is an antiseptic soap that kills germs and continues to kill germs even after washing.   DO NOT use if you have an allergy to chlorhexidine/CHG or antibacterial soaps. If your skin becomes reddened or irritated, stop using the CHG and notify one of our RNs at [phone].              TAKE A SHOWER THE NIGHT BEFORE SURGERY AND THE DAY OF SURGERY    Please keep in mind the following:  DO NOT shave, including legs and underarms, 48 hours prior to surgery.   You may shave your face before/day of surgery.  Place clean sheets on your bed the night before surgery Use a clean washcloth (not used since being washed) for each shower. DO NOT sleep with pet's night before surgery.  CHG Shower Instructions:  Wash your face and private area with normal soap. If you choose to wash your hair, wash first with your normal shampoo.  After you use shampoo/soap, rinse your hair and body thoroughly to remove shampoo/soap residue.  Turn the water OFF and apply half the bottle of CHG soap to a CLEAN washcloth.  Apply  CHG soap ONLY FROM YOUR NECK DOWN TO YOUR TOES (washing for 3-5 minutes)  DO NOT use CHG soap on face, private areas, open wounds, or sores.  Pay special attention to the area where your surgery is being performed.  If you are having back surgery, having someone wash your back for you may be helpful. Wait 2 minutes after CHG soap is applied, then you may rinse off the CHG soap.  Pat dry with a clean towel  Put on clean pajamas    Additional instructions for the day of surgery: DO NOT APPLY any lotions, deodorants, cologne, or perfumes.   Do not wear jewelry or makeup Do not wear nail polish, gel polish, artificial nails, or any  other type of covering on natural nails (fingers and toes) Do not bring valuables to the hospital. [facility] is not responsible for valuables/personal belongings. Put on clean/comfortable clothes.  Please brush your teeth.  Ask your nurse before applying any prescription medications to the skin.

## 2024-06-11 NOTE — Anesthesia Preprocedure Evaluation (Addendum)
 Anesthesia Evaluation  Patient identified by MRN, date of birth, ID band Patient awake    Reviewed: Allergy & Precautions, H&P , NPO status , Patient's Chart, lab work & pertinent test results  Airway Mallampati: III  TM Distance: >3 FB Neck ROM: Full    Dental  (+) Missing, Dental Advisory Given   Pulmonary former smoker   Pulmonary exam normal breath sounds clear to auscultation       Cardiovascular hypertension (187/70 preop, last took BP meds yesterday AM), Pt. on medications + Peripheral Vascular Disease and + DOE  Normal cardiovascular exam+ dysrhythmias (1st deg AVB, PSVT) + Valvular Problems/Murmurs (mild AI) AI  Rhythm:Regular Rate:Normal  follows with cardiology for history of first-degree AV block, SVT, HLD, HTN, PAD.  Lexiscan  06/2022 was low risk.  Echo 06/2022 showed EF 60 to 65%, grade 1 DD.  Event monitor 04/2022 showed average heart rate 73 bpm, predominant rhythm was sinus, first-degree AV block, 4 episodes of SVT.     Neuro/Psych CVA  negative psych ROS   GI/Hepatic negative GI ROS, Neg liver ROS,,,  Endo/Other  diabetes, Well Controlled, Type 2, Insulin  Dependent, Oral Hypoglycemic AgentsHypothyroidism  Obesity BMI 37 Last A1c 8.3  Renal/GU negative Renal ROS  negative genitourinary   Musculoskeletal negative musculoskeletal ROS (+)    Abdominal  (+) + obese  Peds negative pediatric ROS (+)  Hematology negative hematology ROS (+)   Anesthesia Other Findings   Reproductive/Obstetrics negative OB ROS                              Anesthesia Physical Anesthesia Plan  ASA: 3  Anesthesia Plan: General   Post-op Pain Management: Tylenol  PO (pre-op)*   Induction: Intravenous  PONV Risk Score and Plan: 3 and Ondansetron , Dexamethasone , Midazolam and Treatment may vary due to age or medical condition  Airway Management Planned: Oral ETT  Additional Equipment: Arterial  line  Intra-op Plan:   Post-operative Plan: Extubation in OR  Informed Consent: I have reviewed the patients History and Physical, chart, labs and discussed the procedure including the risks, benefits and alternatives for the proposed anesthesia with the patient or authorized representative who has indicated his/her understanding and acceptance.     Dental advisory given  Plan Discussed with: CRNA  Anesthesia Plan Comments:          Anesthesia Quick Evaluation

## 2024-06-11 NOTE — Progress Notes (Addendum)
 Anesthesia Chart Review:  78 year old female follows with cardiology for history of first-degree AV block, SVT, HLD, HTN, PAD.  Lexiscan  06/2022 was low risk.  Echo 06/2022 showed EF 60 to 65%, grade 1 DD.  Event monitor 04/2022 showed average heart rate 73 bpm, predominant rhythm was sinus, first-degree AV block, 4 episodes of SVT.  Seen by Delon Hoover, NP on 05/22/2024 for preop evaluation.  Per note, According to the Revised Cardiac Risk Index (RCRI), her Perioperative Risk of Major Cardiac Event is (%): 0.9 Her Functional Capacity in METs is: 6.05 according to the [facility] Activity Status Index (DASI).  Therefore, based on ACC/AHA guidelines, patient would be at acceptable risk for the planned procedure without further cardiovascular testing. I will route this recommendation to the requesting party via Epic fax function.  Regarding her aspirin, this can be held per vascular surgeon recommendations, it appears she is on it for her PAD.  Other pertinent history includes former smoker (quit 2005), IDDM2 (A1c 8.3 on 04/24/2024), hypothyroid, CVA 2023, CKD 2.  Preop labs reviewed, glucose elevated 274, otherwise unremarkable.  EKG 05/22/2024: NSR.  Rate 74.  Nuclear stress 06/14/2022:   Findings are consistent with no ischemia and no prior myocardial infarction. The study is low risk.   No ST deviation was noted.   Left ventricular function is normal. Nuclear stress EF: 61 %. The left ventricular ejection fraction is normal (55-65%). End diastolic cavity size is normal.   Prior study not available for comparison.  TTE 06/14/2022:  1. Left ventricular ejection fraction, by estimation, is 60 to 65%. The  left ventricle has normal function. The left ventricle has no regional  wall motion abnormalities. Left ventricular diastolic parameters are  consistent with Grade I diastolic  dysfunction (impaired relaxation).   2. Right ventricular systolic function is normal. The right ventricular  size is normal.  There is normal pulmonary artery systolic pressure.   3. The mitral valve is normal in structure. No evidence of mitral valve  regurgitation. No evidence of mitral stenosis.   4. The aortic valve is normal in structure. Aortic valve regurgitation is  mild. No aortic stenosis is present.   5. The inferior vena cava is normal in size with greater than 50%  respiratory variability, suggesting right atrial pressure of 3 mmHg.      Lynwood Geofm RIGGERS [facility] Short Stay Center/Anesthesiology Phone [phone] 06/11/2024 3:54 PM

## 2024-06-16 ENCOUNTER — Inpatient Hospital Stay (HOSPITAL_COMMUNITY): Payer: Self-pay | Admitting: Vascular Surgery

## 2024-06-16 ENCOUNTER — Inpatient Hospital Stay (HOSPITAL_COMMUNITY)

## 2024-06-16 ENCOUNTER — Encounter (HOSPITAL_COMMUNITY): Payer: Self-pay | Admitting: Vascular Surgery

## 2024-06-16 ENCOUNTER — Encounter (HOSPITAL_COMMUNITY)
Admission: RE | Disposition: A | Discharge status: Home Health | Payer: Self-pay | Source: Home / Self Care | Attending: Vascular Surgery

## 2024-06-16 ENCOUNTER — Other Ambulatory Visit: Payer: Self-pay

## 2024-06-16 ENCOUNTER — Inpatient Hospital Stay (HOSPITAL_COMMUNITY)
Admission: RE | Admit: 2024-06-16 | Discharge: 2024-06-20 | DRG: 252 | Disposition: A | Discharge status: Home Health | Attending: Vascular Surgery | Admitting: Vascular Surgery

## 2024-06-16 DIAGNOSIS — I10 Essential (primary) hypertension: Secondary | ICD-10-CM | POA: Diagnosis not present

## 2024-06-16 DIAGNOSIS — R509 Fever, unspecified: Secondary | ICD-10-CM | POA: Diagnosis not present

## 2024-06-16 DIAGNOSIS — Z6837 Body mass index (BMI) 37.0-37.9, adult: Secondary | ICD-10-CM

## 2024-06-16 DIAGNOSIS — E039 Hypothyroidism, unspecified: Secondary | ICD-10-CM

## 2024-06-16 DIAGNOSIS — I739 Peripheral vascular disease, unspecified: Principal | ICD-10-CM | POA: Diagnosis present

## 2024-06-16 DIAGNOSIS — E1165 Type 2 diabetes mellitus with hyperglycemia: Secondary | ICD-10-CM | POA: Diagnosis not present

## 2024-06-16 DIAGNOSIS — N179 Acute kidney failure, unspecified: Secondary | ICD-10-CM | POA: Diagnosis not present

## 2024-06-16 DIAGNOSIS — E113413 Type 2 diabetes mellitus with severe nonproliferative diabetic retinopathy with macular edema, bilateral: Secondary | ICD-10-CM | POA: Diagnosis not present

## 2024-06-16 DIAGNOSIS — Z7984 Long term (current) use of oral hypoglycemic drugs: Secondary | ICD-10-CM | POA: Diagnosis not present

## 2024-06-16 DIAGNOSIS — I70221 Atherosclerosis of native arteries of extremities with rest pain, right leg: Secondary | ICD-10-CM | POA: Diagnosis not present

## 2024-06-16 DIAGNOSIS — J9601 Acute respiratory failure with hypoxia: Secondary | ICD-10-CM | POA: Diagnosis not present

## 2024-06-16 DIAGNOSIS — Z7982 Long term (current) use of aspirin: Secondary | ICD-10-CM

## 2024-06-16 DIAGNOSIS — L2989 Other pruritus: Secondary | ICD-10-CM | POA: Diagnosis not present

## 2024-06-16 DIAGNOSIS — R599 Enlarged lymph nodes, unspecified: Secondary | ICD-10-CM | POA: Diagnosis not present

## 2024-06-16 DIAGNOSIS — D62 Acute posthemorrhagic anemia: Secondary | ICD-10-CM | POA: Diagnosis not present

## 2024-06-16 DIAGNOSIS — E669 Obesity, unspecified: Secondary | ICD-10-CM | POA: Diagnosis not present

## 2024-06-16 DIAGNOSIS — Z48812 Encounter for surgical aftercare following surgery on the circulatory system: Secondary | ICD-10-CM

## 2024-06-16 DIAGNOSIS — N182 Chronic kidney disease, stage 2 (mild): Secondary | ICD-10-CM | POA: Diagnosis not present

## 2024-06-16 DIAGNOSIS — R0602 Shortness of breath: Secondary | ICD-10-CM | POA: Diagnosis not present

## 2024-06-16 DIAGNOSIS — Z91018 Allergy to other foods: Secondary | ICD-10-CM

## 2024-06-16 DIAGNOSIS — R42 Dizziness and giddiness: Principal | ICD-10-CM | POA: Diagnosis not present

## 2024-06-16 DIAGNOSIS — I70229 Atherosclerosis of native arteries of extremities with rest pain, unspecified extremity: Secondary | ICD-10-CM | POA: Diagnosis present

## 2024-06-16 DIAGNOSIS — Z833 Family history of diabetes mellitus: Secondary | ICD-10-CM

## 2024-06-16 DIAGNOSIS — Z87891 Personal history of nicotine dependence: Secondary | ICD-10-CM | POA: Diagnosis not present

## 2024-06-16 DIAGNOSIS — Z79899 Other long term (current) drug therapy: Secondary | ICD-10-CM

## 2024-06-16 DIAGNOSIS — E876 Hypokalemia: Secondary | ICD-10-CM | POA: Diagnosis not present

## 2024-06-16 DIAGNOSIS — I129 Hypertensive chronic kidney disease with stage 1 through stage 4 chronic kidney disease, or unspecified chronic kidney disease: Secondary | ICD-10-CM | POA: Diagnosis not present

## 2024-06-16 DIAGNOSIS — J189 Pneumonia, unspecified organism: Secondary | ICD-10-CM | POA: Diagnosis not present

## 2024-06-16 DIAGNOSIS — R0989 Other specified symptoms and signs involving the circulatory and respiratory systems: Secondary | ICD-10-CM | POA: Diagnosis not present

## 2024-06-16 DIAGNOSIS — Z961 Presence of intraocular lens: Secondary | ICD-10-CM | POA: Diagnosis present

## 2024-06-16 DIAGNOSIS — I517 Cardiomegaly: Secondary | ICD-10-CM | POA: Diagnosis not present

## 2024-06-16 DIAGNOSIS — Z8249 Family history of ischemic heart disease and other diseases of the circulatory system: Secondary | ICD-10-CM

## 2024-06-16 DIAGNOSIS — E1122 Type 2 diabetes mellitus with diabetic chronic kidney disease: Secondary | ICD-10-CM | POA: Diagnosis not present

## 2024-06-16 DIAGNOSIS — D649 Anemia, unspecified: Secondary | ICD-10-CM | POA: Diagnosis not present

## 2024-06-16 DIAGNOSIS — Z79890 Hormone replacement therapy (postmenopausal): Secondary | ICD-10-CM

## 2024-06-16 DIAGNOSIS — Z9842 Cataract extraction status, left eye: Secondary | ICD-10-CM

## 2024-06-16 DIAGNOSIS — E782 Mixed hyperlipidemia: Secondary | ICD-10-CM | POA: Diagnosis present

## 2024-06-16 DIAGNOSIS — E119 Type 2 diabetes mellitus without complications: Secondary | ICD-10-CM | POA: Diagnosis not present

## 2024-06-16 DIAGNOSIS — E1151 Type 2 diabetes mellitus with diabetic peripheral angiopathy without gangrene: Secondary | ICD-10-CM | POA: Diagnosis present

## 2024-06-16 DIAGNOSIS — R590 Localized enlarged lymph nodes: Secondary | ICD-10-CM | POA: Diagnosis not present

## 2024-06-16 DIAGNOSIS — E8809 Other disorders of plasma-protein metabolism, not elsewhere classified: Secondary | ICD-10-CM | POA: Diagnosis present

## 2024-06-16 DIAGNOSIS — Z794 Long term (current) use of insulin: Secondary | ICD-10-CM | POA: Diagnosis not present

## 2024-06-16 DIAGNOSIS — Z9841 Cataract extraction status, right eye: Secondary | ICD-10-CM

## 2024-06-16 DIAGNOSIS — E66812 Obesity, class 2: Secondary | ICD-10-CM | POA: Diagnosis present

## 2024-06-16 DIAGNOSIS — R918 Other nonspecific abnormal finding of lung field: Secondary | ICD-10-CM | POA: Diagnosis not present

## 2024-06-16 DIAGNOSIS — Z8673 Personal history of transient ischemic attack (TIA), and cerebral infarction without residual deficits: Secondary | ICD-10-CM

## 2024-06-16 HISTORY — PX: ENDARTERECTOMY FEMORAL: SHX5804

## 2024-06-16 HISTORY — PX: PATCH ANGIOPLASTY: SHX6230

## 2024-06-16 LAB — CBC
HCT: 37.1 % (ref 36.0–46.0)
Hemoglobin: 11.7 g/dL — ABNORMAL LOW (ref 12.0–15.0)
MCH: 27.2 pg (ref 26.0–34.0)
MCHC: 31.5 g/dL (ref 30.0–36.0)
MCV: 86.3 fL (ref 80.0–100.0)
Platelets: 206 K/uL (ref 150–400)
RBC: 4.3 MIL/uL (ref 3.87–5.11)
RDW: 18.1 % — ABNORMAL HIGH (ref 11.5–15.5)
WBC: 9 K/uL (ref 4.0–10.5)
nRBC: 0 % (ref 0.0–0.2)

## 2024-06-16 LAB — GLUCOSE, CAPILLARY
Glucose-Capillary: 206 mg/dL — ABNORMAL HIGH (ref 70–99)
Glucose-Capillary: 194 mg/dL — ABNORMAL HIGH (ref 70–99)
Glucose-Capillary: 172 mg/dL — ABNORMAL HIGH (ref 70–99)
Glucose-Capillary: 137 mg/dL — ABNORMAL HIGH (ref 70–99)
Glucose-Capillary: 124 mg/dL — ABNORMAL HIGH (ref 70–99)

## 2024-06-16 LAB — CREATININE, SERUM
Creatinine, Ser: 0.69 mg/dL (ref 0.44–1.00)
GFR, Estimated: >60 mL/min (ref >60)

## 2024-06-16 LAB — POCT ACTIVATED CLOTTING TIME
Activated Clotting Time: 158 s
Activated Clotting Time: 331 s
Activated Clotting Time: 199 s
Activated Clotting Time: 273 s
Activated Clotting Time: 199 s

## 2024-06-16 LAB — ABO/RH: ABO/RH(D): A POS

## 2024-06-16 SURGERY — ENDARTERECTOMY, FEMORAL
Anesthesia: General | Site: Groin | Laterality: Right

## 2024-06-16 MED ORDER — ALBUMIN HUMAN 5 % IV SOLN: INTRAVENOUS | Status: DC | PRN

## 2024-06-16 MED ORDER — CEFAZOLIN SODIUM-DEXTROSE 2-4 GM/100ML-% IV SOLN
2.0000 g | Freq: Three times a day (TID) | INTRAVENOUS | Status: AC
  Administered 2024-06-16 – 2024-06-17 (×2): 2 g via INTRAVENOUS
  Filled 2024-06-16 (×2): qty 100

## 2024-06-16 MED ORDER — ONDANSETRON HCL 4 MG/2ML IJ SOLN
INTRAMUSCULAR | Status: DC | PRN
  Administered 2024-06-16: 4 mg via INTRAVENOUS

## 2024-06-16 MED ORDER — HYDRALAZINE HCL 20 MG/ML IJ SOLN: 5.0000 mg | INTRAMUSCULAR | Status: DC | PRN

## 2024-06-16 MED ORDER — LACTATED RINGERS IV SOLN: INTRAVENOUS | Status: DC

## 2024-06-16 MED ORDER — PROPOFOL 10 MG/ML IV BOLUS
INTRAVENOUS | Status: AC
  Filled 2024-06-16: qty 20

## 2024-06-16 MED ORDER — POTASSIUM CHLORIDE CRYS ER 20 MEQ PO TBCR: 40.0000 meq | EXTENDED_RELEASE_TABLET | Freq: Every day | ORAL | Status: DC | PRN

## 2024-06-16 MED ORDER — ONDANSETRON HCL 4 MG/2ML IJ SOLN: 4.0000 mg | Freq: Four times a day (QID) | INTRAMUSCULAR | Status: DC | PRN

## 2024-06-16 MED ORDER — PROTAMINE SULFATE 10 MG/ML IV SOLN
INTRAVENOUS | Status: DC | PRN
  Administered 2024-06-16: 40 mg via INTRAVENOUS

## 2024-06-16 MED ORDER — INSULIN ASPART 100 UNIT/ML IJ SOLN
INTRAMUSCULAR | Status: AC
  Administered 2024-06-16: 2 [IU] via SUBCUTANEOUS
  Filled 2024-06-16: qty 1

## 2024-06-16 MED ORDER — CEFAZOLIN SODIUM-DEXTROSE 2-4 GM/100ML-% IV SOLN
2.0000 g | INTRAVENOUS | Status: AC
  Administered 2024-06-16: 2 g via INTRAVENOUS
  Filled 2024-06-16: qty 100

## 2024-06-16 MED ORDER — HEPARIN 6000 UNIT IRRIGATION SOLUTION
Status: AC
Start: 2024-06-16 — End: 2024-06-16
  Filled 2024-06-16: qty 500

## 2024-06-16 MED ORDER — INSULIN ASPART PROT & ASPART (70-30 MIX) 100 UNIT/ML ~~LOC~~ SUSP
20.0000 [IU] | Freq: Every day | SUBCUTANEOUS | Status: DC
  Filled 2024-06-16 (×2): qty 10

## 2024-06-16 MED ORDER — SODIUM CHLORIDE 0.9 % IV SOLN: 500.0000 mL | Freq: Once | INTRAVENOUS | Status: DC | PRN

## 2024-06-16 MED ORDER — HEMOSTATIC AGENTS (NO CHARGE) OPTIME
TOPICAL | Status: DC | PRN
Start: 2024-06-16 — End: 2024-06-16
  Administered 2024-06-16: 1 via TOPICAL

## 2024-06-16 MED ORDER — PHENOL 1.4 % MT LIQD
1.0000 | OROMUCOSAL | Status: DC | PRN
Start: 2024-06-16 — End: 2024-06-20

## 2024-06-16 MED ORDER — FENTANYL CITRATE (PF) 250 MCG/5ML IJ SOLN
INTRAMUSCULAR | Status: DC | PRN
  Administered 2024-06-16: 25 ug via INTRAVENOUS
  Administered 2024-06-16: 50 ug via INTRAVENOUS
  Administered 2024-06-16: 100 ug via INTRAVENOUS

## 2024-06-16 MED ORDER — HEPARIN 6000 UNIT IRRIGATION SOLUTION
Status: DC | PRN
  Administered 2024-06-16: 1

## 2024-06-16 MED ORDER — METOPROLOL TARTRATE 5 MG/5ML IV SOLN: 2.5000 mg | INTRAVENOUS | Status: DC | PRN

## 2024-06-16 MED ORDER — 0.9 % SODIUM CHLORIDE (POUR BTL) OPTIME
TOPICAL | Status: DC | PRN
  Administered 2024-06-16: 2000 mL

## 2024-06-16 MED ORDER — ROCURONIUM BROMIDE 10 MG/ML (PF) SYRINGE
PREFILLED_SYRINGE | INTRAVENOUS | Status: DC | PRN
  Administered 2024-06-16: 70 mg via INTRAVENOUS
  Administered 2024-06-16: 20 mg via INTRAVENOUS

## 2024-06-16 MED ORDER — BISACODYL 10 MG RE SUPP: 10.0000 mg | Freq: Every day | RECTAL | Status: DC | PRN

## 2024-06-16 MED ORDER — HYDROCHLOROTHIAZIDE 25 MG PO TABS
25.0000 mg | ORAL_TABLET | Freq: Every day | ORAL | Status: DC
  Administered 2024-06-16 – 2024-06-17 (×2): 25 mg via ORAL
  Filled 2024-06-16 (×2): qty 1

## 2024-06-16 MED ORDER — DOCUSATE SODIUM 100 MG PO CAPS
100.0000 mg | ORAL_CAPSULE | Freq: Every day | ORAL | Status: DC
  Administered 2024-06-17: 100 mg via ORAL
  Filled 2024-06-16 (×4): qty 1

## 2024-06-16 MED ORDER — ONDANSETRON HCL 4 MG/2ML IJ SOLN
INTRAMUSCULAR | Status: AC
Start: 2024-06-16 — End: 2024-06-16
  Filled 2024-06-16: qty 2

## 2024-06-16 MED ORDER — ASPIRIN 81 MG PO TBEC
81.0000 mg | DELAYED_RELEASE_TABLET | Freq: Every day | ORAL | Status: DC
  Administered 2024-06-17 – 2024-06-20 (×4): 81 mg via ORAL
  Filled 2024-06-16 (×4): qty 1

## 2024-06-16 MED ORDER — ONDANSETRON HCL 4 MG/2ML IJ SOLN: 4.0000 mg | Freq: Once | INTRAMUSCULAR | Status: DC | PRN

## 2024-06-16 MED ORDER — ENSURE PLUS HIGH PROTEIN PO LIQD
237.0000 mL | Freq: Two times a day (BID) | ORAL | Status: DC
  Administered 2024-06-19: 237 mL via ORAL

## 2024-06-16 MED ORDER — METFORMIN HCL 500 MG PO TABS
1000.0000 mg | ORAL_TABLET | Freq: Two times a day (BID) | ORAL | Status: DC
  Administered 2024-06-16 – 2024-06-20 (×5): 1000 mg via ORAL
  Filled 2024-06-16 (×8): qty 2

## 2024-06-16 MED ORDER — OXYCODONE-ACETAMINOPHEN 5-325 MG PO TABS
1.0000 | ORAL_TABLET | ORAL | Status: DC | PRN
  Administered 2024-06-17 – 2024-06-20 (×3): 2 via ORAL
  Filled 2024-06-16 (×3): qty 2

## 2024-06-16 MED ORDER — LIDOCAINE 2% (20 MG/ML) 5 ML SYRINGE
INTRAMUSCULAR | Status: AC
Start: 2024-06-16 — End: 2024-06-16
  Filled 2024-06-16: qty 5

## 2024-06-16 MED ORDER — CHLORHEXIDINE GLUCONATE CLOTH 2 % EX PADS: 6.0000 | MEDICATED_PAD | Freq: Once | CUTANEOUS | Status: DC

## 2024-06-16 MED ORDER — LOSARTAN POTASSIUM-HCTZ 50-12.5 MG PO TABS: 2.0000 | ORAL_TABLET | Freq: Every day | ORAL | Status: DC

## 2024-06-16 MED ORDER — PHENYLEPHRINE HCL-NACL 20-0.9 MG/250ML-% IV SOLN
INTRAVENOUS | Status: DC | PRN
  Administered 2024-06-16: 30 ug/min via INTRAVENOUS

## 2024-06-16 MED ORDER — CHLORHEXIDINE GLUCONATE 0.12 % MT SOLN
15.0000 mL | OROMUCOSAL | Status: AC
  Administered 2024-06-16: 15 mL via OROMUCOSAL
  Filled 2024-06-16: qty 15

## 2024-06-16 MED ORDER — ATORVASTATIN CALCIUM 40 MG PO TABS
40.0000 mg | ORAL_TABLET | Freq: Every day | ORAL | Status: DC
  Administered 2024-06-17 – 2024-06-20 (×4): 40 mg via ORAL
  Filled 2024-06-16 (×4): qty 1

## 2024-06-16 MED ORDER — HYDROMORPHONE HCL 1 MG/ML IJ SOLN: 0.2500 mg | INTRAMUSCULAR | Status: DC | PRN

## 2024-06-16 MED ORDER — LOSARTAN POTASSIUM 50 MG PO TABS
100.0000 mg | ORAL_TABLET | Freq: Every day | ORAL | Status: DC
  Administered 2024-06-16 – 2024-06-20 (×5): 100 mg via ORAL
  Filled 2024-06-16 (×5): qty 2

## 2024-06-16 MED ORDER — SUGAMMADEX SODIUM 200 MG/2ML IV SOLN
INTRAVENOUS | Status: DC | PRN
  Administered 2024-06-16: 200 mg via INTRAVENOUS

## 2024-06-16 MED ORDER — AMISULPRIDE (ANTIEMETIC) 5 MG/2ML IV SOLN: 10.0000 mg | Freq: Once | INTRAVENOUS | Status: DC | PRN

## 2024-06-16 MED ORDER — INSULIN ASPART PROT & ASPART (70-30 MIX) 100 UNIT/ML ~~LOC~~ SUSP
30.0000 [IU] | Freq: Every day | SUBCUTANEOUS | Status: DC
  Administered 2024-06-16 – 2024-06-17 (×2): 30 [IU] via SUBCUTANEOUS
  Filled 2024-06-16: qty 10

## 2024-06-16 MED ORDER — SODIUM CHLORIDE 0.9 % IV SOLN: INTRAVENOUS | Status: DC

## 2024-06-16 MED ORDER — MORPHINE SULFATE (PF) 2 MG/ML IV SOLN: 2.0000 mg | INTRAVENOUS | Status: DC | PRN

## 2024-06-16 MED ORDER — HEPARIN SODIUM (PORCINE) 5000 UNIT/ML IJ SOLN
5000.0000 [IU] | Freq: Three times a day (TID) | INTRAMUSCULAR | Status: DC
  Administered 2024-06-17 – 2024-06-20 (×11): 5000 [IU] via SUBCUTANEOUS
  Filled 2024-06-16 (×11): qty 1

## 2024-06-16 MED ORDER — OXYCODONE HCL 5 MG PO TABS: 5.0000 mg | ORAL_TABLET | Freq: Once | ORAL | Status: DC | PRN

## 2024-06-16 MED ORDER — LIDOCAINE 2% (20 MG/ML) 5 ML SYRINGE
INTRAMUSCULAR | Status: DC | PRN
  Administered 2024-06-16: 80 mg via INTRAVENOUS

## 2024-06-16 MED ORDER — INSULIN ASPART 100 UNIT/ML IJ SOLN
0.0000 [IU] | Freq: Three times a day (TID) | INTRAMUSCULAR | Status: DC
  Administered 2024-06-16: 3 [IU] via SUBCUTANEOUS

## 2024-06-16 MED ORDER — PROPOFOL 10 MG/ML IV BOLUS
INTRAVENOUS | Status: DC | PRN
  Administered 2024-06-16: 150 mg via INTRAVENOUS

## 2024-06-16 MED ORDER — INSULIN ASPART 100 UNIT/ML IJ SOLN
0.0000 [IU] | INTRAMUSCULAR | Status: AC | PRN
  Administered 2024-06-16: 3 [IU] via SUBCUTANEOUS
  Administered 2024-06-16: 2 [IU] via SUBCUTANEOUS

## 2024-06-16 MED ORDER — PHENYLEPHRINE 80 MCG/ML (10ML) SYRINGE FOR IV PUSH (FOR BLOOD PRESSURE SUPPORT)
PREFILLED_SYRINGE | INTRAVENOUS | Status: DC | PRN
  Administered 2024-06-16 (×4): 80 ug via INTRAVENOUS

## 2024-06-16 MED ORDER — HEPARIN SODIUM (PORCINE) 1000 UNIT/ML IJ SOLN
INTRAMUSCULAR | Status: DC | PRN
  Administered 2024-06-16: 8000 [IU] via INTRAVENOUS

## 2024-06-16 MED ORDER — ACETAMINOPHEN 650 MG RE SUPP: 325.0000 mg | RECTAL | Status: DC | PRN

## 2024-06-16 MED ORDER — ACETAMINOPHEN 500 MG PO TABS
1000.0000 mg | ORAL_TABLET | Freq: Once | ORAL | Status: AC
  Administered 2024-06-16: 1000 mg via ORAL
  Filled 2024-06-16: qty 2

## 2024-06-16 MED ORDER — FENTANYL CITRATE (PF) 250 MCG/5ML IJ SOLN
INTRAMUSCULAR | Status: AC
Start: 2024-06-16 — End: 2024-06-16
  Filled 2024-06-16: qty 5

## 2024-06-16 MED ORDER — POLYETHYLENE GLYCOL 3350 17 G PO PACK: 17.0000 g | PACK | Freq: Every day | ORAL | Status: DC | PRN

## 2024-06-16 MED ORDER — LACTATED RINGERS IV SOLN: INTRAVENOUS | Status: AC

## 2024-06-16 MED ORDER — LABETALOL HCL 5 MG/ML IV SOLN: 10.0000 mg | INTRAVENOUS | Status: DC | PRN

## 2024-06-16 MED ORDER — ONDANSETRON HCL 4 MG/2ML IJ SOLN
INTRAMUSCULAR | Status: AC
  Filled 2024-06-16: qty 2

## 2024-06-16 MED ORDER — ACETAMINOPHEN 325 MG PO TABS
325.0000 mg | ORAL_TABLET | ORAL | Status: DC | PRN
  Administered 2024-06-18 – 2024-06-19 (×2): 650 mg via ORAL
  Filled 2024-06-16 (×2): qty 2

## 2024-06-16 MED ORDER — INSULIN ASPART 100 UNIT/ML IJ SOLN
0.0000 [IU] | Freq: Three times a day (TID) | INTRAMUSCULAR | Status: DC
  Administered 2024-06-16: 2 [IU] via SUBCUTANEOUS
  Administered 2024-06-17 – 2024-06-18 (×2): 3 [IU] via SUBCUTANEOUS
  Administered 2024-06-18 – 2024-06-19 (×2): 2 [IU] via SUBCUTANEOUS
  Administered 2024-06-19: 5 [IU] via SUBCUTANEOUS
  Administered 2024-06-20: 2 [IU] via SUBCUTANEOUS

## 2024-06-16 MED ORDER — LEVOTHYROXINE SODIUM 50 MCG PO TABS
50.0000 ug | ORAL_TABLET | Freq: Every day | ORAL | Status: DC
  Administered 2024-06-17 – 2024-06-20 (×4): 50 ug via ORAL
  Filled 2024-06-16 (×4): qty 1

## 2024-06-16 MED ORDER — OXYCODONE HCL 5 MG/5ML PO SOLN: 5.0000 mg | Freq: Once | ORAL | Status: DC | PRN

## 2024-06-16 SURGICAL SUPPLY — 42 items
BAG COUNTER SPONGE SURGICOUNT (BAG) ×1 IMPLANT
CANISTER SUCTION 3000ML PPV (SUCTIONS) ×1 IMPLANT
CATH EMB 3FR 40 (CATHETERS) IMPLANT
CATH EMB 4FR 40 (CATHETERS) IMPLANT
CLAMP SUTURE YELLOW 5 PAIRS (MISCELLANEOUS) IMPLANT
CLIP TI MEDIUM 24 (CLIP) ×1 IMPLANT
CLIP TI WIDE RED SMALL 24 (CLIP) ×1 IMPLANT
CNTNR URN SCR LID CUP LEK RST (MISCELLANEOUS) IMPLANT
DERMABOND ADVANCED .7 DNX12 (GAUZE/BANDAGES/DRESSINGS) ×1 IMPLANT
DRAPE HALF SHEET 40X57 (DRAPES) IMPLANT
ELECTRODE REM PT RTRN 9FT ADLT (ELECTROSURGICAL) ×1 IMPLANT
EVACUATOR SILICONE 100CC (DRAIN) IMPLANT
GLOVE BIOGEL PI IND STRL 7.0 (GLOVE) ×1 IMPLANT
GOWN STRL REUS W/ TWL LRG LVL3 (GOWN DISPOSABLE) ×2 IMPLANT
GOWN STRL REUS W/ TWL XL LVL3 (GOWN DISPOSABLE) ×1 IMPLANT
GRAFT VASC PATCH XENOSURE 1X14 (Vascular Products) IMPLANT
HEMOSTAT SNOW SURGICEL 2X4 (HEMOSTASIS) IMPLANT
KIT BASIN OR (CUSTOM PROCEDURE TRAY) ×1 IMPLANT
KIT DRSG PREVENA PLUS 7DAY 125 (MISCELLANEOUS) IMPLANT
KIT PREVENA INCISION MGT 13 (CANNISTER) IMPLANT
KIT TURNOVER KIT B (KITS) ×1 IMPLANT
LOOP VESSEL MINI RED (MISCELLANEOUS) IMPLANT
NS IRRIG 1000ML POUR BTL (IV SOLUTION) ×2 IMPLANT
PACK PERIPHERAL VASCULAR (CUSTOM PROCEDURE TRAY) ×1 IMPLANT
PAD ARMBOARD POSITIONER FOAM (MISCELLANEOUS) ×2 IMPLANT
POWDER SURGICEL 3.0 GRAM (HEMOSTASIS) IMPLANT
SPONGE T-LAP 18X18 ~~LOC~~+RFID (SPONGE) IMPLANT
STOPCOCK 4 WAY LG BORE MALE ST (IV SETS) IMPLANT
SUT ETHILON 3 0 PS 1 (SUTURE) IMPLANT
SUT MNCRL AB 4-0 PS2 18 (SUTURE) ×1 IMPLANT
SUT PROLENE 5 0 C 1 24 (SUTURE) ×1 IMPLANT
SUT PROLENE 5 0 C 1 36 (SUTURE) IMPLANT
SUT PROLENE 6 0 BV (SUTURE) ×1 IMPLANT
SUT PROLENE 7 0 BV 1 (SUTURE) IMPLANT
SUT SILK 2 0 SH CR/8 (SUTURE) IMPLANT
SUT VIC AB 2-0 CT1 TAPERPNT 27 (SUTURE) ×1 IMPLANT
SUT VIC AB 3-0 SH 27X BRD (SUTURE) ×1 IMPLANT
SYR 3ML LL SCALE MARK (SYRINGE) IMPLANT
TOWEL GREEN STERILE (TOWEL DISPOSABLE) ×2 IMPLANT
TOWEL GREEN STERILE FF (TOWEL DISPOSABLE) ×1 IMPLANT
UNDERPAD 30X36 HEAVY ABSORB (UNDERPADS AND DIAPERS) ×1 IMPLANT
WATER STERILE IRR 1000ML POUR (IV SOLUTION) ×1 IMPLANT

## 2024-06-16 NOTE — Discharge Instructions (Addendum)
[facility]  Discharge instructions  Lower Extremity Vascular Surgery  Please refer to the following instruction for your post-procedure care. Your surgeon or physician assistant will discuss any changes with you.  Activity  You are encouraged to walk as much as you can. You can slowly return to normal activities during the month after your surgery. Avoid strenuous activity and heavy lifting until your doctor tells you it's OK. Avoid activities such as vacuuming or swinging a golf club. Do not drive until your doctor give the OK and you are no longer taking prescription pain medications. It is also normal to have difficulty with sleep habits, eating and bowel movement after surgery. These will go away with time.  Bathing/Showering  Shower daily after you go home. Do not soak in a bathtub, hot tub, or swim until the incision heals completely.  Incision Care  Clean your incision with mild soap and water. Shower every day. Pat the area dry with a clean towel. You do not need a bandage unless otherwise instructed. Do not apply any ointments or creams to your incision. If you have open wounds you will be instructed how to care for them or a visiting nurse may be arranged for you. If you have staples or sutures along your incision they will be removed at your post-op appointment. You may have skin glue on your incision. Do not peel it off. It will come off on its own in about one week.  Wash the groin wound with soap and water daily and pat dry. (No tub bath-only shower)  Then put a dry gauze or washcloth in the groin to keep this area dry to help prevent wound infection.  Do this daily and as needed.  Do not use Vaseline or neosporin on your incisions.  Only use soap and water on your incisions and then protect and keep dry.  Diet  Resume your normal diet. There are no special food restrictions following this procedure. A low fat/ low cholesterol diet is  recommended for all patients with vascular disease. In order to heal from your surgery, it is CRITICAL to get adequate nutrition. Your body requires vitamins, minerals, and protein. Vegetables are the best source of vitamins and minerals. Vegetables also provide the perfect balance of protein. Processed food has little nutritional value, so try to avoid this.  Medications  Resume taking all your medications unless your doctor or physician assistant tells you not to. If your incision is causing pain, you may take over-the-counter pain relievers such as acetaminophen (Tylenol). If you were prescribed a stronger pain medication, please aware these medication can cause nausea and constipation. Prevent nausea by taking the medication with a snack or meal. Avoid constipation by drinking plenty of fluids and eating foods with high amount of fiber, such as fruits, vegetables, and grains. Take Colace 100 mg (an over-the-counter stool softener) twice a day as needed for constipation.  Do not take Tylenol if you are taking prescription pain medications.  Follow Up  Our office will schedule a follow up appointment 2-3 weeks following discharge.  Please call us immediately for any of the following conditions  .Severe or worsening pain in your legs or feet while at rest or while walking .Increase pain, redness, warmth, or drainage (pus) from your incision site(s) . Fever of 101 degree or higher . The swelling in your leg with the bypass suddenly worsens and becomes more painful than when you were in the hospital .  If you have been instructed to feel your graft pulse then you should do so every day. If you can no longer feel this pulse, call the office immediately. Not all patients are given this instruction. .  Leg swelling is common after leg bypass surgery.  The swelling should improve over a few months following surgery. To improve the swelling, you may elevate your legs above the level of your heart while  you are sitting or resting. Your surgeon or physician assistant may ask you to apply an ACE wrap or wear compression (TED) stockings to help to reduce swelling.  Reduce your risk of vascular disease  Stop smoking. If you would like help call [facility] at 1-800-QUIT-NOW 570-542-2068) or [place] at [phone].  . Manage your cholesterol . Maintain a desired weight . Control your diabetes weight . Control your diabetes . Keep your blood pressure down .  If you have any questions, please call the office at [phone]

## 2024-06-16 NOTE — Op Note (Signed)
 OPERATIVE NOTE  PROCEDURE:   Right common femoral and SFA endarterectomy with patch angioplasty   PRE-OPERATIVE DIAGNOSIS: CLTI with rest pain  POST-OPERATIVE DIAGNOSIS: same as above   SURGEON: Norman GORMAN Serve MD  ASSISTANT(S): Lucie Apt, PA  Given the complexity of the case,  the assistant was necessary in order to expedient the procedure and safely perform the technical aspects of the operation.  The assistant provided traction and countertraction to assist with exposure of the artery proximally and distally.  They assisted with suture ligature of multiple branches.  Their assistance was critical in the performance of exposure and the patch angioplasty.These skills, especially following the Prolene suture for the anastomosis, could not have been adequately performed by a scrub tech assistant.   ANESTHESIA: general  ESTIMATED BLOOD LOSS: 500 cc  FINDING(S): Severely calcified right  SPECIMEN(S):  right groin lymph node sent for pathology  INDICATIONS:   Sara [name] is a 78 y.o. female with CL TI with rest pain.  She was seen for follow-up in office with a CTA as she did not have palpable femoral pulses.  At that time she was noted to have worsening of her symptoms.  She was previously claudicating but started having constant right foot pain.  After review of her CT scan I offered right femoral endarterectomy with patch angioplasty.  Risks and benefits were reviewed, she expressed understanding and elected to proceed.  DESCRIPTION: After full informed written consent was obtained, the patient was brought back to the operating room and placed supine upon the operating table.  After induction with general anesthesia the right groin was prepped and draped in the usual sterile fashion.  Preoperative antibiotics were administered and a timeout was performed.   The right common femoral artery was visualized with ultrasound and a vertical incision was made overlying the  common femoral artery  Using blunt dissection and electrocautery, the artery was dissected out from the inguinal ligament down to the femoral bifurcation.  The superficial femoral artery, profunda femoral artery, and external iliac artery were dissected circumferentially and vessel loops applied.  Circumflex branches were controlled with silk ties.  Distally in the wound near the SFA there is noted to be a large lymph node.  This was excised and the stalk was ligated with a silk tie.  The lymph node was then sent for pathology. The common femoral artery, SFA and profunda were found to be severely calcified. The patient was then systemically heparinized and after waiting three minutes, the external iliac, profunda and superficial femoral arteries were clamped.  An arteriotomy was made with an 11 blade and extended proximally and distally onto the SFA with a Potts scissor.  An endarterectomy plane was developed and the plaque was freed circumferentially from the arterial wall and the midpoint of the common femoral artery.  The plaque was then transected.  The endarterectomy was then continued proximally and the plaque was freed on all edges into the external iliac artery.  Using a tonsil the plaque was grabbed and gently removed with the proximal clamp slightly released effectively removing the entire proximal aspect of the plaque that was extending into the external iliac artery.  The inflow was then inspected which was pulsatile.  This area was copiously irrigated and debrided and flaps were gently removed from the arterial wall.  The endarterectomy was then carried down further distally.  There was a section of the plaque that was tracking into the profunda.  This was transected before  the ostium.  The plaque was then circumferentially dissected off along the SFA.  It should be noted that there was severe disease throughout the entire length of the SFA that was exposed which was approximately 6 cm.  I was able to  find a soft spot where the calcific portion of the plaque proximately can be removed in the soft remaining atherosclerotic disease could be tacked down with tacking sutures.  7-0 Prolene was used as tacking sutures to tack down the distal flap and the SFA.  The endarterectomy site was then copiously irrigated and freed debris and flaps were removed.  The area of plaque that was transected leading into the profunda was inspected and this appeared to be calcified although adherent to the wall and did not necessitate any tacking sutures.  A bovine pericardial patch was then fashioned to the arteriotomy with running 5-0 Prolene.  Prior to completion all vessels were allowed to backbleed and the external iliac was allowed to forward flush.  A few repair stitches were placed with 6-0 Prolene for hemostasis.  A Doppler machine was then brought onto the field and there was an excellent multiphasic Doppler signal in both the SFA and profunda.  Hemostatic agent was then placed around in the wound and heparin  was reversed.  After few minutes of manual pressure the wound was copiously irrigated and inspected for hemostasis which was achieved with electrocautery and vascular clips.  Hemostatic agent was replaced over top of the anastomosis and the incision was closed in layers with 2-0 Vicryl, 3-0 Vicryl and 4-0 Monocryl for the skin.  A Prevena incisional vacuum was then placed over the incision. All counts were correct. The patient tolerated the procedure well and was transferred to PACU in stable condition.     COMPLICATIONS: none apparent  CONDITION: stable  Norman GORMAN Serve MD [facility] Phone Number: [phone] 06/16/2024 1:59 PM

## 2024-06-16 NOTE — Consult Note (Signed)
 Initial Consultation Note   Patient: Sara [name] FMW:[id number] DOB: 02-28-1946 PCP: Verdia Lombard, MD DOA: 06/16/2024 DOS: the patient was seen and examined on 06/16/2024 Primary service: Pearline Norman RAMAN, MD  Referring physician:  [place] Lucie JINNY DEVONNA on behalf of Pearline Norman RAMAN, MD Reason for consult: Assist with medical management  HPI/Course: Sara [name] is a 78 y.o. female with medical history significant of hypertension, hyperlipidemia, diabetes, CVA, PAD, hypothyroidism, CKD 2, anemia, obesity who presented for right femoral endarterectomy in the setting of right lower extremity PAD with claudication.  Patient had known PAD and had been experiencing claudication with pain relief at rest.  Has been following with vascular surgery.  CTA on 6/5 showed severe focal stenosis at right common femoral artery and severe dense atherosclerosis of the right popliteal artery.  Moderate disease at the left lower extremity.  Plan has been made outpatient for right femoral endarterectomy today which was performed successfully per vascular surgery.  Patient seen in PACU and subjective feeling okay, still drowsy from surgery. History obtained with assistance of translator.  Initial vital signs notable for blood pressure in the 180s systolic.  Lab workup is pending.  Has received Ancef .  Review of Systems: As per HPI otherwise all other systems reviewed and are negative.  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/2022   DM (diabetes mellitus) (HCC)    DOE (dyspnea on exertion) 05/24/2022   Essential  hypertension 04/25/2022   Hyperglycemia due to type 2 diabetes mellitus (HCC) 05/24/2022   Hypertension with renal disease    Hypothyroidism    Long term (current) use of insulin  (HCC)    Menopause 05/24/2022   Mixed hyperlipidemia    Morbid obesity due to excess calories (HCC)    Obesity (BMI 35.0-39.9 without comorbidity) 05/24/2022   Postmenopausal bleeding 05/30/2022   PVD (peripheral vascular disease) (HCC)    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) 05/24/2022   Stroke (HCC) 05/24/2022   Transient arterial retinal occlusion 05/24/2022   Type 2 diabetes mellitus with hyperglycemia (HCC)    Type 2 diabetes mellitus with other specified complication (HCC) 05/24/2022   Type 2 diabetes mellitus with peripheral angiopathy (HCC) 05/24/2022    Past Surgical History:  Procedure Laterality Date   APPENDECTOMY     as a teenager   CATARACT EXTRACTION W/ INTRAOCULAR LENS IMPLANT Bilateral    CESAREAN SECTION  1976   CESAREAN SECTION  1979   COLONOSCOPY     polypectomy  2023   Uterine   TUBAL LIGATION  1979   with C-Section    Social History  reports that she quit smoking about 19 years ago. Her smoking use included cigarettes. She has never used smokeless tobacco. She reports current alcohol use. She reports that she does not use drugs.  Allergies  Allergen Reactions   Other     Eggplant causes extreme fatigue    Family History  Problem Relation Age of Onset   Heart disease Mother    Diabetes Brother    Diabetes Maternal  Uncle    Colon cancer Neg Hx    Esophageal cancer Neg Hx    Stomach cancer Neg Hx    Rectal cancer Neg Hx   Reviewed on admission  Prior to Admission medications   Medication Sig Start Date End Date Taking? Authorizing Provider  aspirin  EC 81 MG tablet Take 81 mg by mouth daily. Swallow whole.   Yes [provider]  atorvastatin  (LIPITOR) 40 MG tablet Take 40 mg by mouth  daily. 05/20/24  Yes [provider]  EUTHYROX  50 MCG tablet Take 50 mcg by mouth daily before breakfast. 06/15/19  Yes [provider]  insulin  NPH-regular Human (NOVOLIN 70/30) (70-30) 100 UNIT/ML injection Inject 30 Units into the skin 2 (two) times daily with a meal. 30 units in the morning and 50 units at night   Yes [provider]  losartan -hydrochlorothiazide  (HYZAAR) 50-12.5 MG tablet Take 2 tablets by mouth daily. 02/16/22  Yes [provider]  metFORMIN  (GLUCOPHAGE ) 1000 MG tablet Take 1,000 mg by mouth 2 (two) times daily with a meal.   Yes [provider]  NOVOLOG  MIX 70/30 FLEXPEN (70-30) 100 UNIT/ML FlexPen Inject 5-40 Units into the skin See admin instructions. INJECT 25 UNITS SUBCUTANEOUSLY IN THE MORNING THEN INJECT 5 UNITS AT LUNCH AND 40 UNITS IN THE EVENING. TITRATE TO MMD 100 UNITS Patient not taking: Reported on 06/10/2024 09/17/18  Yes [provider]    Physical Exam: Vitals:   06/16/24 0801 06/16/24 1330 06/16/24 1345  BP: (!) 187/70  (!) 145/64  Pulse: 76 73 73  Resp: 19 17 (!) 28  Temp: 98.3 F (36.8 C) 98.5 F (36.9 C)   TempSrc: Oral    SpO2: 91% 100% 95%  Weight: 93 kg    Height: 5' 2 (1.575 m)      Physical Exam Constitutional:      General: She is not in acute distress.    Appearance: Normal appearance. She is obese.  HENT:     Head: Normocephalic and atraumatic.     Mouth/Throat:     Mouth: Mucous membranes are moist.     Pharynx: Oropharynx is clear.  Eyes:     Extraocular Movements: Extraocular movements intact.     Pupils: Pupils are equal, round, and reactive to light.  Cardiovascular:     Rate and Rhythm: Normal rate and regular rhythm.     Pulses: Normal pulses.     Heart sounds: Normal heart sounds.  Pulmonary:     Effort: Pulmonary effort is normal. No respiratory distress.     Breath sounds: Normal breath sounds.  Abdominal:     General: Bowel sounds are normal. There is no  distension.     Palpations: Abdomen is soft.     Tenderness: There is no abdominal tenderness.  Musculoskeletal:        General: No swelling or deformity.  Skin:    General: Skin is warm and dry.  Neurological:     General: No focal deficit present.     Mental Status: Mental status is at baseline.    Labs on Admission: I have personally reviewed following labs and imaging studies  CBC: Recent Labs  Lab 06/10/24 1436  WBC 9.3  HGB 14.7  HCT 46.6*  MCV 85.8  PLT 261    Basic Metabolic Panel: Recent Labs  Lab 06/10/24 1436  NA 137  K 3.7  CL 96*  CO2 32  GLUCOSE 223*  BUN 15  CREATININE 0.90  CALCIUM   8.5*    GFR: Estimated Creatinine Clearance: 57.4 mL/min (by C-G formula based on SCr of 0.9 mg/dL).  Liver Function Tests: Recent Labs  Lab 06/10/24 1436  AST 19  ALT 20  ALKPHOS 112  BILITOT 1.2  PROT 7.5  ALBUMIN  3.6    Urine analysis:    Component Value Date/Time   COLORURINE YELLOW 06/10/2024 1436   APPEARANCEUR CLEAR 06/10/2024 1436   LABSPEC 1.012 06/10/2024 1436   PHURINE 6.0 06/10/2024 1436   GLUCOSEU NEGATIVE 06/10/2024 1436   HGBUR NEGATIVE 06/10/2024 1436   BILIRUBINUR NEGATIVE 06/10/2024 1436   KETONESUR NEGATIVE 06/10/2024 1436   PROTEINUR NEGATIVE 06/10/2024 1436   NITRITE NEGATIVE 06/10/2024 1436   LEUKOCYTESUR NEGATIVE 06/10/2024 1436    Radiological Exams on Admission: No results found.  EKG: Not yet performed this admission  Assessment/Plan Principal Problem:   Claudication in peripheral vascular disease (HCC) Active Problems:   Anemia   Hypertension with renal disease   Hypothyroidism   Mixed hyperlipidemia   PVD (peripheral vascular disease) (HCC)   Type 2 diabetes mellitus with hyperglycemia (HCC)   Chronic kidney disease, stage 2 (mild)   Obesity (BMI 35.0-39.9 without comorbidity)   PAD Claudication > Presented for right lower extremity femoral arterectomy which has been successfully performed vascular  surgery. - Management per vascular surgery - Remains on aspirin  and atorvastatin   Hypertension - Continue home losartan -hydrochlorothiazide   Hyperlipidemia - Continue atorvastatin   Diabetes > On metformin  and 70/30 insulin  30 units every morning, 50 units at bedtime at home. - Continue home metformin  - Continue 70/30 insulin  at 20 every morning and 30 units nightly - SSI  History of CVA - Continue home ASA, atorvastatin   Hypothyroidism - Continue Synthroid   CKD 2 - Repeat labs pending  Anemia - Labs pending  Obesity - Noted  Thank you very much for involving us  in the care of your patient.  [facility] will continue to follow the patient.  Author: Marsa KATHEE Scurry, MD 06/16/2024 2:02 PM  For on call review [email or web address].

## 2024-06-16 NOTE — Plan of Care (Signed)

## 2024-06-16 NOTE — H&P (Signed)
 Patient seen and examined.  No complaints. No changes to medication history or physical exam since last seen. After discussing the risks and benefits of R femoral endarterectomy, [name] elected to proceed.   Norman GORMAN Serve MD   ____________________________________________________________________________       Patient ID: Sara [name], female   DOB: 08-04-1946, 78 y.o.   MRN: [id number]   Reason for Consult: Follow-up   Referred by Verdia Lombard, MD   Subjective:    Subjective HPI Sara [name] is a 78 y.o. female presenting for follow-up.  She was seen about 4 weeks ago was noted to have right calf claudication.  She did not have rest pain or slow/nonhealing wounds at that time.  I was unable to palpate a right femoral pulse and therefore we planned for short interval follow-up with a CTA abdomen pelvis.  She presents today to discuss those results. Today she reports She continues to have right calf claudication although this is not lifestyle limiting. She defines rest pain or slow/nonhealing wounds.        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   Cardiac murmur 05/24/2022   Cataract 2016    bilateral cataracts with lensectomy and lens implant   Cerebral infarction (HCC) 05/24/2022   Chronic kidney disease due to hypertension 05/24/2022   Chronic kidney disease, stage 2 (mild) 05/24/2022   Chronic renal insufficiency, stage II (mild)     Diabetic renal disease (HCC) 05/24/2022   DM (diabetes mellitus) (HCC)     DOE (dyspnea on exertion) 05/24/2022   Essential hypertension 04/25/2022   Hyperglycemia due to type 2 diabetes mellitus (HCC) 05/24/2022   Hypertension with renal disease     Hypothyroidism     Long term (current) use of insulin  (HCC)     Menopause 05/24/2022   Mixed hyperlipidemia     Morbid obesity due to excess calories (HCC)     Obesity (BMI 35.0-39.9 without comorbidity) 05/24/2022    Postmenopausal bleeding 05/30/2022   PVD (peripheral vascular disease) (HCC)     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) 05/24/2022   Stroke (HCC) 05/24/2022   Transient arterial retinal occlusion 05/24/2022   Type 2 diabetes mellitus with hyperglycemia (HCC)     Type 2 diabetes mellitus with other specified complication (HCC) 05/24/2022   Type 2 diabetes mellitus with peripheral angiopathy (HCC) 05/24/2022             Family History  Problem Relation Age of Onset   Heart disease Mother     Diabetes Brother     Diabetes Maternal Uncle     Colon cancer Neg Hx     Esophageal cancer Neg Hx     Stomach cancer Neg Hx     Rectal cancer Neg Hx               Past Surgical History:  Procedure Laterality Date   APPENDECTOMY       CATARACT EXTRACTION, BILATERAL       CESAREAN SECTION       polypectomy   2023    Uterine   TUBAL LIGATION              Short Social History:  Social History         Tobacco Use   Smoking status: Former      Current packs/day: 0.00  Types: Cigarettes      Quit date: 10/01/2008      Years since quitting: 15.6   Smokeless tobacco: Never  Substance Use Topics   Alcohol use: Yes      Comment: occasional      Allergies  No Known Allergies           Current Outpatient Medications  Medication Sig Dispense Refill   amLODipine (NORVASC) 10 MG tablet Take 10 mg by mouth daily.   4   aspirin  EC 81 MG tablet Take 81 mg by mouth daily. Swallow whole.       atorvastatin  (LIPITOR) 20 MG tablet Take 20 mg by mouth daily.       EUTHYROX  50 MCG tablet Take 50 mcg by mouth daily.       losartan -hydrochlorothiazide  (HYZAAR) 50-12.5 MG tablet Take 1 tablet by mouth daily.       metFORMIN  (GLUCOPHAGE ) 1000 MG tablet Take 1,000 mg by mouth daily with breakfast.       NOVOLOG  MIX 70/30 FLEXPEN (70-30) 100 UNIT/ML FlexPen INJECT 25 UNITS SUBCUTANEOUSLY IN THE MORNING THEN INJECT  5 UNITS AT LUNCH AND 40 UNITS IN THE EVENING. TITRATE TO MMD 100 UNITS   6      No current facility-administered medications for this visit.        REVIEW OF SYSTEMS  All other systems were reviewed and are negative       Objective:    Objective[] Expand by Default    Vitals:    05/01/24 1609  BP: 131/67  Pulse: 69  Resp: 20  Temp: 98.2 F (36.8 C)  TempSrc: Temporal  SpO2: 90%  Weight: 210 lb (95.3 kg)  Height: 5' 1 (1.549 m)    Body mass index is 39.68 kg/m.   Physical Exam General: no acute distress Cardiac: hemodynamically stable Pulm: normal work of breathing Abdomen: non-tender, no pulsatile mass Neuro: alert, no focal deficit Extremities: Some dependent rubor present on the right foot, no edema, cyanosis or wounds Vascular:              Right: Nonpalpable femoral, possibly due to body habitus, nonpalpable pedal pulses             Left: Nonpalpable femoral, possibly due to body habitus, nonpalpable pedal pulses   Data: ABI +---------+------------------+-----+----------+  Right   Rt Pressure (mmHg)IndexWaveform    +---------+------------------+-----+----------+  Brachial 148                                +---------+------------------+-----+----------+  PTA     54                0.36 monophasic  +---------+------------------+-----+----------+  DP      63                0.42 monophasic  +---------+------------------+-----+----------+  Great Toe0                 0.00 Absent      +---------+------------------+-----+----------+   +---------+------------------+-----+--------+  Left    Lt Pressure (mmHg)IndexWaveform  +---------+------------------+-----+--------+  Brachial 151                              +---------+------------------+-----+--------+  PTA     113               0.75 biphasic  +---------+------------------+-----+--------+  DP  156               1.03 biphasic   +---------+------------------+-----+--------+  Great Toe72                0.48 Abnormal  +---------+------------------+-----+--------+    CTA independently reviewed Moderate calcific disease of the infrarenal aorta and common iliac arteries.  The external iliacs appear widely patent. Large bulky calcific plaque in the right common femoral artery, multifocal calcific disease of the SFA.  A bulky calcific plaque in the right popliteal artery.  There appears to be three-vessel runoff bilaterally although there is some calcific disease throughout the tibials      Assessment/Plan:    Valjean A Bulls is a 78 y.o. female with peripheral arterial disease and claudication. We did discuss the option of right femoral endarterectomy although I explained that given that she is a claudicant that she is at low risk of future limb loss and therefore medical management is a reasonable choice as surgical revascularization is not absolutely necessary at this time. Will plan to follow up in 3 months with an ABI and re-discuss her symptoms      Norman GORMAN Serve MD [facility]     Patient called back stating she would like to proceed with intervention. Will plan for RIGHT femoral endarterectomy.

## 2024-06-16 NOTE — Transfer of Care (Addendum)
 Immediate Anesthesia Transfer of Care Note  Patient: Sara [name]  Procedure(s) Performed: RIGHT FEMORAL ENDARTERECTOMY (Right: Groin) ANGIOPLASTY, USING XENOSURE BIOLOGIC PATCH (Right: Groin)  Patient Location: PACU  Anesthesia Type:General  Level of Consciousness: awake, drowsy, patient cooperative, and responds to stimulation  Airway & Oxygen  Therapy: Patient Spontanous Breathing and Patient connected to face mask oxygen   Post-op Assessment: Report given to RN and Post -op Vital signs reviewed and stable  Post vital signs: Reviewed and stable  Last Vitals:  Vitals Value Taken Time  BP 175/70   Temp    Pulse 73 06/16/24 13:30  Resp 17 06/16/24 13:30  SpO2 100 % 06/16/24 13:30  Vitals shown include unfiled device data.  Last Pain:  Vitals:   06/16/24 0803  TempSrc:   PainSc: 0-No pain      Patients Stated Pain Goal: 0 (06/16/24 0803)  Complications: No notable events documented.

## 2024-06-16 NOTE — Anesthesia Postprocedure Evaluation (Signed)
 Anesthesia Post Note  Patient: Sara [name]  Procedure(s) Performed: RIGHT FEMORAL ENDARTERECTOMY (Right: Groin) ANGIOPLASTY, USING XENOSURE BIOLOGIC PATCH (Right: Groin)     Patient location during evaluation: PACU Anesthesia Type: General Level of consciousness: awake and alert, oriented and patient cooperative Pain management: pain level controlled Vital Signs Assessment: post-procedure vital signs reviewed and stable Respiratory status: spontaneous breathing, nonlabored ventilation and respiratory function stable Cardiovascular status: blood pressure returned to baseline and stable Postop Assessment: no apparent nausea or vomiting Anesthetic complications: no   No notable events documented.  Last Vitals:  Vitals:   06/16/24 1415 06/16/24 1430  BP: (!) 147/65 (!) 148/65  Pulse: 67 68  Resp: 14 14  Temp:  36.9 C  SpO2: 97% 97%    Last Pain:  Vitals:   06/16/24 1430  TempSrc:   PainSc: 0-No pain    LLE Motor Response: Purposeful movement (06/16/24 1430) LLE Sensation: Full sensation (06/16/24 1430) RLE Motor Response: Purposeful movement (06/16/24 1430) RLE Sensation: Full sensation (06/16/24 1430)      Almarie CHRISTELLA Marchi

## 2024-06-16 NOTE — Anesthesia Procedure Notes (Signed)
 Procedure Name: Intubation Date/Time: 06/16/2024 9:49 AM  Performed by: Myrna Homer, CRNAPre-anesthesia Checklist: Patient identified, Emergency Drugs available, Suction available and Patient being monitored Patient Re-evaluated:Patient Re-evaluated prior to induction Oxygen  Delivery Method: Circle System Utilized Preoxygenation: Pre-oxygenation with 100% oxygen  Induction Type: IV induction Ventilation: Mask ventilation without difficulty Laryngoscope Size: Glidescope and 3 Grade View: Grade I Tube type: Oral Tube size: 7.0 mm Number of attempts: 1 Airway Equipment and Method: Stylet and Oral airway Placement Confirmation: ETT inserted through vocal cords under direct vision, positive ETCO2 and breath sounds checked- equal and bilateral Secured at: 20 cm Tube secured with: Tape Dental Injury: Teeth and Oropharynx as per pre-operative assessment  Comments: By Westley Pepper, SRNA.

## 2024-06-17 ENCOUNTER — Encounter (HOSPITAL_COMMUNITY): Payer: Self-pay | Admitting: Vascular Surgery

## 2024-06-17 DIAGNOSIS — I70229 Atherosclerosis of native arteries of extremities with rest pain, unspecified extremity: Secondary | ICD-10-CM | POA: Diagnosis not present

## 2024-06-17 DIAGNOSIS — D649 Anemia, unspecified: Secondary | ICD-10-CM | POA: Diagnosis not present

## 2024-06-17 DIAGNOSIS — E782 Mixed hyperlipidemia: Secondary | ICD-10-CM

## 2024-06-17 DIAGNOSIS — E669 Obesity, unspecified: Secondary | ICD-10-CM

## 2024-06-17 DIAGNOSIS — I129 Hypertensive chronic kidney disease with stage 1 through stage 4 chronic kidney disease, or unspecified chronic kidney disease: Secondary | ICD-10-CM

## 2024-06-17 DIAGNOSIS — N182 Chronic kidney disease, stage 2 (mild): Secondary | ICD-10-CM | POA: Diagnosis not present

## 2024-06-17 DIAGNOSIS — E1165 Type 2 diabetes mellitus with hyperglycemia: Secondary | ICD-10-CM

## 2024-06-17 DIAGNOSIS — E039 Hypothyroidism, unspecified: Secondary | ICD-10-CM

## 2024-06-17 LAB — HEMOGLOBIN A1C
Hgb A1c MFr Bld: 9 % — ABNORMAL HIGH (ref 4.8–5.6)
Mean Plasma Glucose: 212 mg/dL

## 2024-06-17 LAB — CBC
HCT: 37.5 % (ref 36.0–46.0)
Hemoglobin: 11.7 g/dL — ABNORMAL LOW (ref 12.0–15.0)
MCH: 27.3 pg (ref 26.0–34.0)
MCHC: 31.2 g/dL (ref 30.0–36.0)
MCV: 87.4 fL (ref 80.0–100.0)
Platelets: 196 K/uL (ref 150–400)
RBC: 4.29 MIL/uL (ref 3.87–5.11)
RDW: 18.5 % — ABNORMAL HIGH (ref 11.5–15.5)
WBC: 9 K/uL (ref 4.0–10.5)
nRBC: 0 % (ref 0.0–0.2)

## 2024-06-17 LAB — BASIC METABOLIC PANEL WITH GFR
Anion gap: 8 (ref 5–15)
BUN: 9 mg/dL (ref 8–23)
CO2: 31 mmol/L (ref 22–32)
Calcium: 8.6 mg/dL — ABNORMAL LOW (ref 8.9–10.3)
Chloride: 102 mmol/L (ref 98–111)
Creatinine, Ser: 0.74 mg/dL (ref 0.44–1.00)
GFR, Estimated: >60 mL/min (ref >60)
Glucose, Bld: 60 mg/dL — ABNORMAL LOW (ref 70–99)
Potassium: 3.4 mmol/L — ABNORMAL LOW (ref 3.5–5.1)
Sodium: 141 mmol/L (ref 135–145)

## 2024-06-17 LAB — LIPID PANEL
Cholesterol: 76 mg/dL (ref 0–200)
HDL: 27 mg/dL — ABNORMAL LOW (ref >40)
LDL Cholesterol: 32 mg/dL (ref 0–99)
Total CHOL/HDL Ratio: 2.8 ratio
Triglycerides: 85 mg/dL (ref <150)
VLDL: 17 mg/dL (ref 0–40)

## 2024-06-17 LAB — GLUCOSE, CAPILLARY
Glucose-Capillary: 96 mg/dL (ref 70–99)
Glucose-Capillary: 151 mg/dL — ABNORMAL HIGH (ref 70–99)
Glucose-Capillary: 220 mg/dL — ABNORMAL HIGH (ref 70–99)

## 2024-06-17 MED ORDER — POTASSIUM CHLORIDE CRYS ER 20 MEQ PO TBCR
40.0000 meq | EXTENDED_RELEASE_TABLET | Freq: Two times a day (BID) | ORAL | Status: AC
Start: 2024-06-17 — End: 2024-06-17
  Administered 2024-06-17 (×2): 40 meq via ORAL
  Filled 2024-06-17 (×2): qty 2

## 2024-06-17 NOTE — Evaluation (Signed)
 Physical Therapy Evaluation Patient Details Name: Sara [name] MRN: [id number] DOB: [birth year] Today's Date: 06/17/2024  History of Present Illness  Pt is a 78 y.o. female admitted 8/5 for scheduled R femoral endarterectomy. S/p Right common femoral and SFA endarterectomy with patch angioplasty 8/5. PMH: HTN, HLD, DM, CVA, PAD, hypothyroidism, CKD 2, anemia, obesity  Clinical Impression  Pt presents with RLE pain and decreased activity tolerance. Pt to benefit from acute PT to address deficits. Pt ambulated house distance with RW without significant LOB but desat with activity. Pt expressed concerns about having care at home as she is the primary caretaker for her husband and her daughter can help but is busy. Recommend HHPT to address further deficits. PT to progress mobility as tolerated, and will continue to follow acutely.      If plan is discharge home, recommend the following: A little help with walking and/or transfers;A little help with bathing/dressing/bathroom;Assistance with cooking/housework;Assist for transportation   Can travel by private vehicle        Equipment Recommendations Rolling walker (2 wheels);Other (comment) (O2)  Recommendations for Other Services       Functional Status Assessment Patient has had a recent decline in their functional status and demonstrates the ability to make significant improvements in function in a reasonable and predictable amount of time.     Precautions / Restrictions Precautions Precautions: Fall;Other (comment) Recall of Precautions/Restrictions: Intact Precaution/Restrictions Comments: Wound Vac for RLE Restrictions Weight Bearing Restrictions Per Provider Order: No      Mobility  Bed Mobility               General bed mobility comments: In recliner at the start and end of session    Transfers Overall transfer level: Needs assistance Equipment used: Rolling walker (2 wheels) Transfers: Sit to/from Stand Sit to  Stand: Contact guard assist           General transfer comment: CGA, slow to rise, good hand placement    Ambulation/Gait Ambulation/Gait assistance: Contact guard assist Gait Distance (Feet): 200 Feet Assistive device: Rolling walker (2 wheels) Gait Pattern/deviations: Decreased stride length, Antalgic, Step-through pattern       General Gait Details: Desat with ambulation. No signs of LOB while using RW  Stairs            Wheelchair Mobility     Tilt Bed    Modified Rankin (Stroke Patients Only)       Balance Overall balance assessment: Needs assistance Sitting-balance support: No upper extremity supported, Feet supported Sitting balance-Leahy Scale: Fair     Standing balance support: During functional activity, Reliant on assistive device for balance, Bilateral upper extremity supported Standing balance-Leahy Scale: Good Standing balance comment: Good with RW support                             Pertinent Vitals/Pain Pain Assessment Pain Assessment: Faces Faces Pain Scale: Hurts a little bit Pain Location: R groin Pain Descriptors / Indicators: Discomfort, Grimacing Pain Intervention(s): Monitored during session    Home Living Family/patient expects to be discharged to:: Private residence Living Arrangements: Spouse/significant other Available Help at Discharge: Family;Available 24 hours/day (Daughter available for few days 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, broadcasting/film/video) Type of Home: House Home Access: Stairs to enter Entrance Stairs-Rails: None Secretary/administrator of Steps: 1   Home Layout: One level Home Equipment: BSC/3in1;Shower seat Additional Comments: Husband is blind pt reports is his primary caretaker, assists  with all ADLs    Prior Function Prior Level of Function : Independent/Modified Independent             Mobility Comments: Denies falls ADLs Comments: Pt ind with ADLs and IADLs     Extremity/Trunk Assessment   Upper  Extremity Assessment Upper Extremity Assessment: Defer to OT evaluation    Lower Extremity Assessment Lower Extremity Assessment: Generalized weakness    Cervical / Trunk Assessment Cervical / Trunk Assessment: Normal  Communication   Communication Communication: No apparent difficulties Factors Affecting Communication: Non - English speaking, interpreter not available (Pt understands most English)    Cognition Arousal: Alert Behavior During Therapy: WFL for tasks assessed/performed                             Following commands: Intact       Cueing Cueing Techniques: Verbal cues     General Comments General comments (skin integrity, edema, etc.): Pt desat to 80s on RA. Required 2L of O2 get to 90s    Exercises General Exercises - Lower Extremity Long Arc Quad: AROM, Both, 10 reps, Seated   Assessment/Plan    PT Assessment Patient needs continued PT services  PT Problem List Decreased activity tolerance;Decreased mobility;Decreased range of motion       PT Treatment Interventions Gait training;Stair training;Functional mobility training;Therapeutic activities;DME instruction    PT Goals (Current goals can be found in the Care Plan section)  Acute Rehab PT Goals Patient Stated Goal: Return home PT Goal Formulation: With patient Time For Goal Achievement: 07/01/24 Potential to Achieve Goals: Good    Frequency Min 2X/week     Co-evaluation               AM-PAC PT 6 Clicks Mobility  Outcome Measure Help needed turning from your back to your side while in a flat bed without using bedrails?: A Little Help needed moving from lying on your back to sitting on the side of a flat bed without using bedrails?: A Little Help needed moving to and from a bed to a chair (including a wheelchair)?: A Little Help needed standing up from a chair using your arms (e.g., wheelchair or bedside chair)?: A Little Help needed to walk in hospital room?: A  Little Help needed climbing 3-5 steps with a railing? : A Lot 6 Click Score: 17    End of Session Equipment Utilized During Treatment: Gait belt Activity Tolerance: Patient tolerated treatment well Patient left: in chair;with call bell/phone within reach;with chair alarm set Nurse Communication: Mobility status PT Visit Diagnosis: Unsteadiness on feet (R26.81);Other abnormalities of gait and mobility (R26.89)    Time: 9078-9054 PT Time Calculation (min) (ACUTE ONLY): 24 min   Charges:   PT Evaluation $PT Eval Moderate Complexity: 1 Mod PT Treatments $Gait Training: 8-22 mins PT General Charges $$ ACUTE PT VISIT: 1 Visit         Quintin Campi, SPT  Acute Rehab  [phone]   Quintin Campi 06/17/2024, 11:12 AM

## 2024-06-17 NOTE — Plan of Care (Signed)
  Problem: Clinical Measurements: Goal: Ability to maintain clinical measurements within normal limits will improve Outcome: Progressing Goal: Will remain free from infection Outcome: Progressing Goal: Diagnostic test results will improve Outcome: Progressing Goal: Respiratory complications will improve Outcome: Progressing Goal: Cardiovascular complication will be avoided Outcome: Progressing   Problem: Activity: Goal: Risk for activity intolerance will decrease Outcome: Progressing   Problem: Nutrition: Goal: Adequate nutrition will be maintained Outcome: Progressing   Problem: Coping: Goal: Level of anxiety will decrease Outcome: Progressing   Problem: Elimination: Goal: Will not experience complications related to bowel motility Outcome: Progressing Goal: Will not experience complications related to urinary retention Outcome: Progressing   Problem: Pain Managment: Goal: General experience of comfort will improve and/or be controlled Outcome: Progressing   Problem: Safety: Goal: Ability to remain free from injury will improve Outcome: Progressing   Problem: Skin Integrity: Goal: Risk for impaired skin integrity will decrease Outcome: Progressing   Problem: Coping: Goal: Ability to adjust to condition or change in health will improve Outcome: Progressing   Problem: Fluid Volume: Goal: Ability to maintain a balanced intake and output will improve Outcome: Progressing   Problem: Health Behavior/Discharge Planning: Goal: Ability to identify and utilize available resources and services will improve Outcome: Progressing Goal: Ability to manage health-related needs will improve Outcome: Progressing   Problem: Metabolic: Goal: Ability to maintain appropriate glucose levels will improve Outcome: Progressing   Problem: Nutritional: Goal: Maintenance of adequate nutrition will improve Outcome: Progressing   Problem: Skin Integrity: Goal: Risk for impaired skin  integrity will decrease Outcome: Progressing   Problem: Tissue Perfusion: Goal: Adequacy of tissue perfusion will improve Outcome: Progressing   Problem: Education: Goal: Knowledge of prescribed regimen will improve Outcome: Progressing   Problem: Activity: Goal: Ability to tolerate increased activity will improve Outcome: Progressing   Problem: Bowel/Gastric: Goal: Gastrointestinal status for postoperative course will improve Outcome: Progressing   Problem: Clinical Measurements: Goal: Postoperative complications will be avoided or minimized Outcome: Progressing   Problem: Skin Integrity: Goal: Demonstration of wound healing without infection will improve Outcome: Progressing

## 2024-06-17 NOTE — Hospital Course (Addendum)
 The patient is a 78 year old Hispanic female with a past medical history significant for but not limited to essential hypertension, hyperlipidemia, diabetes mellitus type 2, history of CVA, history of PAD, hypothyroidism, CKD stage II, anemia, obesity and other comorbidities who presented for right extremity PAD with claudication and underwent right femoral endarterectomy.  She is postoperative day 2 and TRH has been asked to assist with medical management of her other comorbid conditions.  Postoperatively her renal function is slightly worsened and she is spiking intermittent temperatures and undergoing infectious workup.  Will empirically place on IV antibiotics IV cefepime  and vancomycin  for now.  She was initiated on gentle IV fluid hydration but this is now stopped given her improvement.  If fevers resolved and she is stable she can likely be discharged from a medical perspective.  She was dizzy earlier this morning but this is resolved after IV fluid bolus.  Will give her TED hose and orthostatics were checked and she did not drop now.  There may be a question of VOR dysfunction patient can be followed up in the outpatient setting with neurology and a referral has been made.  Prior to did discharge she did desaturate and will require 2 L of oxygen .  Repeat chest x-ray shows questionable pneumonia and will continue empiric antibiotic coverage and she has been transitioned to oral Augmentin  by the primary team.  Currently the plan is for her to be discharged later today and patient needs to follow-up with her PCP and neurologist in the outpatient setting.  Assessment and Plan:  PAD / Claudication: Presented for right lower extremity femoral arterectomy which has been successfully performed Vascular Surgery on 06/16/24 -Management per Vascular Surgery; Remains on aspirin  and atorvastatin .  Her right lower extremity is well-perfused with brisk PT Doppler signal; Vascular Surgery feels that is okay to mobilize  the patient with PT/OT and they are recommending Home Health    Essential Hypertension: Continue home Losartan  100 mg po daily but discontinue Hydrochlorothiazide  25 mg po daily.  C/w IV Hydralazine  for high blood pressure for 2 doses and also has IV labetalol  10 mg every 10 minutes for high blood pressure for 4 doses. CTM BP per Protocol. Last BP reading was 122/53    Hyperlipidemia: Lipid panel during this admission showed a total Cholesterol/HDL ratio 2.8, cholesterol level 76, HDL 27, LDL 32, Triglycerides of 85, VLDL 17. Continue Atorvastatin  40 mg po Daily.    History of CVA: Continue home ASA 81 mg po Daily and Atorvastatin  40 mg po Daily    Hypothyroidism: Checked TSH and was 0.763. C/w Levothyroxine  50 mcg po Daily    AKI on CKD 2: BUN/Cr Trend went from 15/0.90 -> 9/074 -> 12/1.03 -> 12/0.82 -> 8/0.67. Start NS @ 75 mL/hr and U/A was negative; If persists will check Urine Na+, Urine Cr, and Urine Osm. Avoid Nephrotoxic Medications, Contrast Dyes, Hypotension and Dehydration to Ensure Adequate Renal Perfusion and will need to Renally Adjust Meds. CTM and Trend Renal Function carefully and repeat CMP within 1 week  HypoKalemia: Improved. K+ was 3.4 and improved to 4.9. CTM and Replete as Necessary. Repeat CMP within 1 week  Fever likely in the setting of suspected pneumonia: Unclear Etiology repeat x-ray shows possible pneumonia. Spiked a Tmax of 100.7 the day before yesterday AM and had at Tmax of 101.9 yesterday.  Afebrile today.  Check Blood Cx x2 (NGTD at 2 days) . U/A (Negative) and initial CXR done and showed Borderline to mild cardiomegaly with  slightly prominent central pulmonary vessels, correlate for arterial hypertension. No acute airspace disease. - Repeat chest x-ray done and showed Lower lung volumes from prior exam. Vascular congestion versus bronchovascular crowding. Question of developing patchy airspace disease at the left lung base, atelectasis versus pneumonia  -NS @  75 mL/hr now stopped.   -Empirically placed her on IV vancomycin  and cefepime  given her continued fevers but primary team is now changing to oral Augmentin  which is appropriate.  Checked lower extremity venous duplex and showed no evidence of DVTs. CTM and repeat CBC in the AM; if continues to spike temperatures will consider obtaining a CT chest/abdomen/pelvis and echocardiogram which she is improved and likely can be discharged home today  Acute Respiratory Failure with Hypoxia: In the setting of pneumonia versus pulmonary arterial hypertension.  She desaturated on ambulatory home O2 screen and we will give her a flutter valve and incentive spirometer.  Will write for 2 L of supplemental oxygen  via nasal cannula.  She becomes dizzy with low O2 sats but this improves with 2 L.  Unlikely a PE given that she is not tachycardic and because she has a questionable pneumonia on imaging.  Will empirically treat and continue antibiotics as below and continue breathing treatments.  She can resume antibiotics in the outpatient setting with oral Augmentin .  She will need repeat chest x-ray in 3 to 6 weeks  Dizziness: Likely in the setting of vestibular ocular reflex dysfunction versus orthostasis.  Order TED hose and give her 1 L bolus and dizziness improved.  Can have outpatient evaluation with Neurology in referral has been made and will also write her for meclizine  12.5 mg 3 times daily as needed.  Was going to order a head CT but given her improvement with IV fluids we will hold off.  Since her dizziness is improved can to be discharged home with outpatient close follow-up with PCP and Neurology  Diabetes Mellitus Type 2, Uncontrolled: HbA1c is 9.0.  Patient takes 70/30; Started her on 70/30 20 units in the a.m. and 30 units in the p.m (50 units at bedtime as home dose) but will reduce doses to 15 units in the AM and 20 units at bedtime while hospitalized and send out on 15 units BID for D/C.  She was ordered for a  resistant NovoLog  sign scale AC as well as a moderate NovoLog  sliding scale AC.  Will discontinue the resistant scale.  Will continue Metformin  1 g twice daily.  When she will be n.p.o. she will need to discontinue her 70/30 and continue with Semglee.  Continue monitor CBGs per protocol.  CBG range:  Recent Labs  Lab 06/19/24 0601 06/19/24 0938 06/19/24 1213 06/19/24 1348 06/19/24 1631 06/19/24 2112 06/20/24 0658  GLUCAP 238* 129* 81 83 135* 201* 120*   Normocytic Anemia/ABLA as a postoperative Cause: Hgb/Hct Trend:  Recent Labs  Lab 06/10/24 1436 06/16/24 1549 06/17/24 0334 06/18/24 0348 06/19/24 0352 06/20/24 0359  HGB 14.7 11.7* 11.7* 10.8* 10.7* 11.3*  HCT 46.6* 37.1 37.5 34.9* 34.9* 36.7  MCV 85.8 86.3 87.4 89.5 89.9 88.9  -Check Anemia Panel in the AM. CTM for S/Sx of Bleeding; No overt bleeding noted. Repeat CBC in the AM  Hypoalbuminemia: Patient's Albumin  Lvl went from 3.6 -> 3.1 -> 3.0. CTM and Trend and Repeat CMP in the AM  Class II Obesity: Complicates overall prognosis and care. Estimated body mass index is 37.64 kg/m as calculated from the following:   Height as of this encounter: 5'  2 (1.575 m).   Weight as of this encounter: 93.4 kg. Weight Loss and Dietary Counseling given

## 2024-06-17 NOTE — Progress Notes (Signed)
 SATURATION QUALIFICATIONS: (This note is used to comply with regulatory documentation for home oxygen )  Patient Saturations on Room Air at Rest = 86%  Patient Saturations on Room Air while Ambulating = 82%  Patient Saturations on 2 Liters of oxygen  while Ambulating = 91%  Please briefly explain why patient needs home oxygen : Pt requiring oxygen  with rest and with activity to maintain oxygen  >88%.   Sara [name], SPT  Acute Rehab  [phone]

## 2024-06-17 NOTE — Progress Notes (Signed)
 PROGRESS NOTE    Sara [name]  FMW:[id number] DOB: 1946/07/29 DOA: 06/16/2024 PCP: Verdia Lombard, MD   Brief Narrative:  The patient is a 78 year old Hispanic female with a past medical history significant for but not limited to essential hypertension, hyperlipidemia, diabetes mellitus type 2, history of CVA, history of PAD, hypothyroidism, CKD stage II, anemia, obesity and other comorbidities who presented for right extremity PAD with claudication and underwent right femoral endarterectomy.  She is postoperative day 1 and TR[facility] has been asked to assist with medical management of her other comorbid conditions.  Assessment and Plan:  PAD / Claudication: Presented for right lower extremity femoral arterectomy which has been successfully performed Vascular Surgery on 06/16/24 - Management per Vascular Surgery; Remains on aspirin  and atorvastatin .  Her right lower extremity is well-perfused with brisk PT Doppler signal and vascular surgery feels that is okay to mobilize the patient with PT OT.   Essential Hypertension: Continue home Losartan  100 mg po daily but discontinue Hydrochlorothiazide  25 mg po daily.  With IV hydralazine  5 mg 21 drop for high blood pressure for 2 doses and also has IV labetalol  10 mg every 10 minutes for high blood pressure for 4 doses. CTM BP per Protocol. Last BP reading was 133/62    Hyperlipidemia: Lipid panel during this admission showed a total Cholesterol/HDL ratio 2.8, cholesterol level 76, HDL 27, LDL 32, Triglycerides of 85, VLDL 17. Continue Atorvastatin  40 mg po Daily.    History of CVA: Continue home ASA 81 mg po Daily and Atorvastatin  40 mg po Daily    Hypothyroidism: Check TSH in the AM. C/w Levothyroxine  50 mcg po Daily    CKD 2: BUN/Cr Trend went from 15/0.90 -> 9/074.Avoid Nephrotoxic Medications, Contrast Dyes, Hypotension and Dehydration to Ensure Adequate Renal Perfusion and will need to Renally Adjust Meds. CTM and Trend Renal Function carefully  and repeat CMP in the AM   HypoKalemia: K+ was 3.4. Replete with po KCL 40 mEQ BID x2. CTM and Replete as Necessary. Repeat CMP in the AM  Diabetes Mellitus Type 2, Uncontrolled: HbA1c is 9.0.  Patient takes 70/30 20 units in the a.m. and 30 units in the p.m (50 units at bedtime as home dose).  She was ordered for a resistant NovoLog  sign scale AC as well as a moderate NovoLog  sliding scale AC.  Will discontinue the resistant scale.  Will continue Metformin  1 g twice daily.  When she will be n.p.o. she will need to discontinue her 70/30 and continue with Semglee.  Continue monitor CBGs per protocol.  CBG range:  Recent Labs  Lab 06/10/24 1403 06/16/24 0758 06/16/24 1030 06/16/24 1227 06/16/24 1338 06/16/24 1630 06/17/24 1203  GLUCAP 274* 206* 194* 172* 137* 124* 96   Normocytic Anemia/ABLA as a postoperative Cause: Hgb/Hct Trend:  Recent Labs  Lab 06/10/24 1436 06/16/24 1549 06/17/24 0334  HGB 14.7 11.7* 11.7*  HCT 46.6* 37.1 37.5  MCV 85.8 86.3 87.4  -Check Anemia Panel in the AM. CTM for S/Sx of Bleeding; No overt bleeding noted. Repeat CBC in the AM  Class II Obesity: Complicates overall prognosis and care. Estimated body mass index is 37.49 kg/m as calculated from the following:   Height as of this encounter: 5' 2 (1.575 m).   Weight as of this encounter: 93 kg. Weight Loss and Dietary Counseling given   DVT prophylaxis: heparin  injection 5,000 Units Start: 06/17/24 0600 SCD's Start: 06/16/24 1504    Code Status: Full Code Family Communication: No family  currently at bedside  Disposition Plan:  Level of care: Progressive Status is: Inpatient Remains inpatient appropriate because: Needs further clinical improvement and clearance by the specialists   Consultants:  Vascular Surgery (primary) [facility]  Procedures:  Right common femoral and SFA endarterectomy with patch angioplasty postoperative day 1  Antimicrobials:  Anti-infectives (From admission, onward)    Start      Dose/Rate Route Frequency Ordered Stop   06/16/24 1800  ceFAZolin  (ANCEF ) IVPB 2g/100 mL premix        2 g 200 mL/hr over 30 Minutes Intravenous Every 8 hours 06/16/24 1503 06/17/24 0259   06/16/24 0654  ceFAZolin  (ANCEF ) IVPB 2g/100 mL premix        2 g 200 mL/hr over 30 Minutes Intravenous 30 min pre-op 06/16/24 0654 06/16/24 0955       Subjective: Seen and examined at bedside and sitting in the chair and states that she is ambulating.  Feels okay and states that she does have some pain and rates it at 3 out of 10 in severity.  No nausea or vomiting.  Denies any lightheadedness or dizziness.  No other concerns or complaints at this time.  Objective: Vitals:   06/16/24 2330 06/17/24 0434 06/17/24 0842 06/17/24 1201  BP: (!) 147/64 104/79 (!) 129/49 (!) 139/55  Pulse: 65 80 84 81  Resp: 16 19 18 18   Temp: 97.7 F (36.5 C) 97.9 F (36.6 C) 98.2 F (36.8 C) 99.2 F (37.3 C)  TempSrc: Oral Oral Oral Oral  SpO2: 99% 99% 95% 97%  Weight:      Height:        Intake/Output Summary (Last 24 hours) at 06/17/2024 1522 Last data filed at 06/17/2024 0400 Gross per 24 hour  Intake 693.21 ml  Output 1100 ml  Net -406.79 ml   Filed Weights   06/16/24 0801  Weight: 93 kg   Examination: Physical Exam:  Constitutional: WN/WD obese Hispanic female in no acute distress Respiratory: Diminished to auscultation bilaterally, no wheezing, rales, rhonchi or crackles. Normal respiratory effort and patient is not tachypenic. No accessory muscle use.  Unlabored breathing Cardiovascular: RRR, no murmurs / rubs / gallops. S1 and S2 auscultated. No extremity edema. Abdomen: Soft, non-tender, distended secondary to body habitus bowel sounds positive.  GU: Deferred. Musculoskeletal: No clubbing / cyanosis of digits/nails.  Slightly tender to palpate right lower extremity compared to left. Skin: No rashes, lesions, ulcers on limited skin evaluation. No induration; Warm and dry.  Neurologic: CN 2-12  grossly intact with no focal deficits. Romberg sign and cerebellar reflexes not assessed.  Psychiatric: Normal judgment and insight. Alert and oriented x 3. Normal mood and appropriate affect.   Data Reviewed: I have personally reviewed following labs and imaging studies  CBC: Recent Labs  Lab 06/16/24 1549 06/17/24 0334  WBC 9.0 9.0  HGB 11.7* 11.7*  HCT 37.1 37.5  MCV 86.3 87.4  PLT 206 196   Basic Metabolic Panel: Recent Labs  Lab 06/16/24 1549 06/17/24 0334  NA  --  141  K  --  3.4*  CL  --  102  CO2  --  31  GLUCOSE  --  60*  BUN  --  9  CREATININE 0.69 0.74  CALCIUM   --  8.6*   GFR: Estimated Creatinine Clearance: 64.6 mL/min (by C-G formula based on SCr of 0.74 mg/dL). Liver Function Tests: No results for input(s): AST, ALT, ALKPHOS, BILITOT, PROT, ALBUMIN  in the last 168 hours.  No results for input(s):  LIPASE, AMYLASE in the last 168 hours. No results for input(s): AMMONIA in the last 168 hours. Coagulation Profile: No results for input(s): INR, PROTIME in the last 168 hours.  Cardiac Enzymes: No results for input(s): CKTOTAL, CKMB, CKMBINDEX, TROPONINI in the last 168 hours. BNP (last 3 results) No results for input(s): PROBNP in the last 8760 hours. HbA1C: Recent Labs    06/16/24 1836  HGBA1C 9.0*   CBG: Recent Labs  Lab 06/16/24 1030 06/16/24 1227 06/16/24 1338 06/16/24 1630 06/17/24 1203  GLUCAP 194* 172* 137* 124* 96   Lipid Profile: Recent Labs    06/17/24 0334  CHOL 76  HDL 27*  LDLCALC 32  TRIG 85  CHOLHDL 2.8   Thyroid  Function Tests: No results for input(s): TSH, T4TOTAL, FREET4, T3FREE, THYROIDAB in the last 72 hours. Anemia Panel: No results for input(s): VITAMINB12, FOLATE, FERRITIN, TIBC, IRON , RETICCTPCT in the last 72 hours. Sepsis Labs: No results for input(s): PROCALCITON, LATICACIDVEN in the last 168 hours.  Recent Results (from the past 240 hours)   Surgical pcr screen     Status: None   Collection Time: 06/10/24  2:37 PM   Specimen: Nasal Mucosa; Nasal Swab  Result Value Ref Range Status   MRSA, PCR NEGATIVE NEGATIVE Final   Staphylococcus aureus NEGATIVE NEGATIVE Final    Comment: (NOTE) The Xpert SA Assay (FDA approved for NASAL specimens in patients 56 years of age and older), is one component of a comprehensive surveillance program. It is not intended to diagnose infection nor to guide or monitor treatment. Performed at [facility], [address], [place], [place] [address]     Radiology Studies: No results found.  Scheduled Meds:  aspirin  EC  81 mg Oral Daily   atorvastatin   40 mg Oral Daily   docusate sodium   100 mg Oral Daily   feeding supplement  237 mL Oral BID BM   heparin   5,000 Units Subcutaneous Q8H   insulin  aspart  0-15 Units Subcutaneous TID WC   insulin  aspart protamine - aspart  20 Units Subcutaneous Q breakfast   And   insulin  aspart protamine - aspart  30 Units Subcutaneous Q supper   levothyroxine   50 mcg Oral QAC breakfast   losartan   100 mg Oral Daily   metFORMIN   1,000 mg Oral BID WC   potassium chloride   40 mEq Oral BID   Continuous Infusions:  sodium chloride      lactated ringers      lactated ringers  100 mL/hr at 06/16/24 1816    LOS: 1 day   Alejandro Marker, DO [facility] Available via Epic secure chat 7am-7pm After these hours, please refer to coverage provider listed on [email or web address] 06/17/2024, 3:22 PM

## 2024-06-17 NOTE — Progress Notes (Addendum)
  Progress Note    06/17/2024 8:21 AM 1 Day Post-Op  Subjective:  sleeping    Vitals:   06/16/24 2330 06/17/24 0434  BP: (!) 147/64 104/79  Pulse: 65 80  Resp: 16 19  Temp: 97.7 F (36.5 C) 97.9 F (36.6 C)  SpO2: 99% 99%    Physical Exam: General:  NAD, sleeping Cardiac:  regular Lungs:  nonlabored, on supplemental O2 via [place] Incisions:  right groin with prevena vac with good seal, no evidence of hematoma Extremities:  brisk right PT doppler signal     Component Value Date/Time   WBC 9.0 06/17/2024 0334   RBC 4.29 06/17/2024 0334   HGB 11.7 (L) 06/17/2024 0334   HCT 37.5 06/17/2024 0334   PLT 196 06/17/2024 0334   MCV 87.4 06/17/2024 0334   MCH 27.3 06/17/2024 0334   MCHC 31.2 06/17/2024 0334   RDW 18.5 (H) 06/17/2024 0334    BMET    Component Value Date/Time   NA 141 06/17/2024 0334   K 3.4 (L) 06/17/2024 0334   CL 102 06/17/2024 0334   CO2 31 06/17/2024 0334   GLUCOSE 60 (L) 06/17/2024 0334   BUN 9 06/17/2024 0334   CREATININE 0.74 06/17/2024 0334   CALCIUM  8.6 (L) 06/17/2024 0334   GFRNONAA >60 06/17/2024 0334    INR    Component Value Date/Time   INR 1.0 06/10/2024 1436     Intake/Output Summary (Last 24 hours) at 06/17/2024 9178 Last data filed at 06/17/2024 0400 Gross per 24 hour  Intake 2343.21 ml  Output 2700 ml  Net -356.79 ml      Assessment/Plan:  78 y.o. female is 1 day post op, s/p: right common femoral and SFA endarterectomy with bovine patch angioplasty   -She is resting this morning without any issues overnight -Right groin with prevena vac with good seal. No evidence of hematoma in the right groin -RLE well perfused with brisk PT doppler signal -Hgb stable at 11.7 without further signs of blood loss -Okay to mobilize today with PT/OT -Appreciate assistance with medical management by hospital medicine team   Ahmed Holster, PA-C [facility] [phone] 06/17/2024 8:21 AM   VASCULAR STAFF ADDENDUM: I  have independently interviewed and examined the patient. I agree with the above.    Norman GORMAN Serve MD [facility] Phone Number: [phone] 06/17/2024 12:10 PM

## 2024-06-17 NOTE — Evaluation (Signed)
 Occupational Therapy Evaluation Patient Details Name: Sara [name] MRN: [id number] DOB: 21-Jul-1946 Today's Date: 06/17/2024   History of Present Illness   Pt is a 78 y.o. female admitted 8/5 for scheduled R femoral endarterectomy. PMH: HTN, HLD, DM, CVA, PAD, hypothyroidism, CKD 2, anemia, obesity     Clinical Impressions Pt admitted based on above, and was seen based on problem list below. 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 Today pt is requiring set up  to min assist for ADLs. Bed mobility was min assist and functional transfers are  CGA with RW. Pt reporting daughter is available to assist upon d/c. Pt would greatly benefit from [facility] to return to PLOF. OT will continue to follow acutely to maximize functional independence.        If plan is discharge home, recommend the following:   A little help with walking and/or transfers;A little help with bathing/dressing/bathroom     Functional Status Assessment   Patient has had a recent decline in their functional status and demonstrates the ability to make significant improvements in function in a reasonable and predictable amount of time.     Equipment Recommendations   None recommended by OT      Precautions/Restrictions   Precautions Precautions: Fall Recall of Precautions/Restrictions: Intact Restrictions Weight Bearing Restrictions Per Provider Order: No     Mobility Bed Mobility Overal bed mobility: Needs Assistance Bed Mobility: Supine to Sit     Supine to sit: Min assist     General bed mobility comments: Min HH assist for trunk    Transfers Overall transfer level: Needs assistance Equipment used: Rolling walker (2 wheels) Transfers: Sit to/from Stand, Bed to chair/wheelchair/BSC Sit to Stand: Contact guard assist     Step pivot transfers: Contact guard assist     General transfer comment: CGA, slow to rise, good hand placement       Balance Overall balance assessment: Needs assistance Sitting-balance support: No upper extremity supported, Feet supported Sitting balance-Leahy Scale: Fair     Standing balance support: During functional activity, Reliant on assistive device for balance, No upper extremity supported Standing balance-Leahy Scale: Fair Standing balance comment: Performed perineal hygiene without UE support       ADL either performed or assessed with clinical judgement   ADL Overall ADL's : Needs assistance/impaired Eating/Feeding: Set up;Sitting   Grooming: Set up;Sitting;Wash/dry hands           Upper Body Dressing : Set up;Sitting   Lower Body Dressing: Sit to/from stand;Minimal assistance Lower Body Dressing Details (indicate cue type and reason): Min assist to reach feet Toilet Transfer: Contact guard assist;Ambulation;BSC/3in1;Rolling walker (2 wheels) Toilet Transfer Details (indicate cue type and reason): CGA for balance, slow but no assist Toileting- Clothing Manipulation and Hygiene: Contact guard assist;Sit to/from stand Toileting - Clothing Manipulation Details (indicate cue type and reason): CGA for balance in standing     Functional mobility during ADLs: Contact guard assist;Rolling walker (2 wheels) General ADL Comments: Close to functional baseline, slow d/t pain     Vision Baseline Vision/History: 0 No visual deficits Patient Visual Report: No change from baseline Vision Assessment?: No apparent visual deficits            Pertinent Vitals/Pain Pain Assessment Pain Assessment: Faces Faces Pain Scale: Hurts a little bit Pain Location: R groin Pain Descriptors / Indicators: Discomfort, Grimacing Pain Intervention(s): Monitored during session     Extremity/Trunk Assessment Upper Extremity Assessment  Upper Extremity Assessment: Generalized weakness   Lower Extremity Assessment Lower Extremity Assessment: Defer to PT evaluation   Cervical / Trunk  Assessment Cervical / Trunk Assessment: Normal   Communication Communication Communication: No apparent difficulties Factors Affecting Communication: Non - English speaking, interpreter not available (Pt understands most English)   Cognition Arousal: Alert Behavior During Therapy: WFL for tasks assessed/performed Cognition: No apparent impairments     Following commands: Intact       Cueing  General Comments   Cueing Techniques: Verbal cues  Received on 2L, session performed on RA, O2 stats >91%           Home Living Family/patient expects to be discharged to:: Private residence Living Arrangements: Spouse/significant other Available Help at Discharge: Family;Available 24 hours/day (Daughter available for few days 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, broadcasting/film/video) Type of Home: House Home Access: Stairs to enter Secretary/administrator of Steps: 1 Entrance Stairs-Rails: None Home Layout: One level     Bathroom Shower/Tub: Chief Strategy Officer: Standard Bathroom Accessibility: Yes How Accessible: Accessible via walker Home Equipment: Agricultural consultant (2 wheels);BSC/3in1;Shower seat   Additional Comments: Husband is blind pt reports is his primary caretaker, assists with all ADLs      Prior Functioning/Environment Prior Level of Function : Independent/Modified Independent     Mobility Comments: Using RW,  denies falls ADLs Comments: Pt ind with ADLs and IADLs    OT Problem List: Decreased strength;Decreased range of motion;Decreased activity tolerance;Impaired balance (sitting and/or standing);Cardiopulmonary status limiting activity   OT Treatment/Interventions: Self-care/ADL training;Therapeutic exercise;Energy conservation;DME and/or AE instruction;Therapeutic activities;Patient/family education;Balance training      OT Goals(Current goals can be found in the care plan section)   Acute Rehab OT Goals Patient Stated Goal: To get better OT Goal Formulation:  With patient Time For Goal Achievement: 07/01/24 Potential to Achieve Goals: Good   OT Frequency:  Min 2X/week       AM-PAC OT 6 Clicks Daily Activity     Outcome Measure Help from another person eating meals?: None Help from another person taking care of personal grooming?: A Little Help from another person toileting, which includes using toliet, bedpan, or urinal?: A Little Help from another person bathing (including washing, rinsing, drying)?: A Little Help from another person to put on and taking off regular upper body clothing?: A Little Help from another person to put on and taking off regular lower body clothing?: A Little 6 Click Score: 19   End of Session Equipment Utilized During Treatment: Gait belt;Rolling walker (2 wheels) Nurse Communication: Mobility status  Activity Tolerance: Patient tolerated treatment well Patient left: in chair;with call bell/phone within reach  OT Visit Diagnosis: Unsteadiness on feet (R26.81);Other abnormalities of gait and mobility (R26.89);Muscle weakness (generalized) (M62.81)                Time: 9149-9079 OT Time Calculation (min): 30 min Charges:  OT General Charges $OT Visit: 1 Visit OT Evaluation $OT Eval Moderate Complexity: 1 Mod OT Treatments $Self Care/Home Management : 8-22 mins  Adrianne BROCKS, OT  Acute Rehabilitation Services Office [phone] Secure chat preferred   Adrianne GORMAN Savers 06/17/2024, 9:35 AM

## 2024-06-17 NOTE — Inpatient Diabetes Management (Addendum)
 Inpatient Diabetes Program Recommendations  AACE/ADA: New Consensus Statement on Inpatient Glycemic Control (2015)  Target Ranges:  Prepandial:   less than 140 mg/dL      Peak postprandial:   less than 180 mg/dL (1-2 hours)      Critically ill patients:  140 - 180 mg/dL   Lab Results  Component Value Date   GLUCAP 96 06/17/2024   HGBA1C 9.0 (H) 06/16/2024    Review of Glycemic Control  Diabetes history: DM2 Outpatient Diabetes medications: 70/30 30 units am ac meal, 50 units pm ac dinner Metformin  1 tm bid Current orders for Inpatient glycemic control: 70/30 20 am, 30 units pm, 0-20 units tid, 0-15 units tid, Metformin  1 gm bid  Inpatient Diabetes Program Recommendations:   No CBGs available today and RN Kristi to check CBG. Please consider: -D/C 0-20 units tid correction When pt. NPO will need to change insulin  to [facility] and d/c 70/30.  Thank you, Sara [name] E. Sara Heinz, RN, MSN, CDCES  Diabetes Coordinator Inpatient Glycemic Control Team Team Pager [phone] (8am-5pm) 06/17/2024 12:16 PM

## 2024-06-18 ENCOUNTER — Inpatient Hospital Stay (HOSPITAL_COMMUNITY)

## 2024-06-18 DIAGNOSIS — I129 Hypertensive chronic kidney disease with stage 1 through stage 4 chronic kidney disease, or unspecified chronic kidney disease: Secondary | ICD-10-CM | POA: Diagnosis not present

## 2024-06-18 DIAGNOSIS — I70229 Atherosclerosis of native arteries of extremities with rest pain, unspecified extremity: Secondary | ICD-10-CM | POA: Diagnosis not present

## 2024-06-18 DIAGNOSIS — N182 Chronic kidney disease, stage 2 (mild): Secondary | ICD-10-CM | POA: Diagnosis not present

## 2024-06-18 DIAGNOSIS — D649 Anemia, unspecified: Secondary | ICD-10-CM | POA: Diagnosis not present

## 2024-06-18 LAB — GLUCOSE, CAPILLARY
Glucose-Capillary: 55 mg/dL — ABNORMAL LOW (ref 70–99)
Glucose-Capillary: 65 mg/dL — ABNORMAL LOW (ref 70–99)
Glucose-Capillary: 66 mg/dL — ABNORMAL LOW (ref 70–99)
Glucose-Capillary: 152 mg/dL — ABNORMAL HIGH (ref 70–99)
Glucose-Capillary: 130 mg/dL — ABNORMAL HIGH (ref 70–99)
Glucose-Capillary: 224 mg/dL — ABNORMAL HIGH (ref 70–99)

## 2024-06-18 LAB — HEPATIC FUNCTION PANEL
ALT: 12 U/L (ref 0–44)
AST: 18 U/L (ref 15–41)
Albumin: 3.1 g/dL — ABNORMAL LOW (ref 3.5–5.0)
Alkaline Phosphatase: 76 U/L (ref 38–126)
Bilirubin, Direct: 0.1 mg/dL (ref 0.0–0.2)
Indirect Bilirubin: 0.3 mg/dL (ref 0.3–0.9)
Total Bilirubin: 0.4 mg/dL (ref 0.0–1.2)
Total Protein: 6.5 g/dL (ref 6.5–8.1)

## 2024-06-18 LAB — CBC WITH DIFFERENTIAL/PLATELET
Abs Immature Granulocytes: 0.04 K/uL (ref 0.00–0.07)
Basophils Absolute: 0 K/uL (ref 0.0–0.1)
Basophils Relative: 0 %
Eosinophils Absolute: 0.3 K/uL (ref 0.0–0.5)
Eosinophils Relative: 3 %
HCT: 34.9 % — ABNORMAL LOW (ref 36.0–46.0)
Hemoglobin: 10.8 g/dL — ABNORMAL LOW (ref 12.0–15.0)
Immature Granulocytes: 0 %
Lymphocytes Relative: 30 %
Lymphs Abs: 2.7 K/uL (ref 0.7–4.0)
MCH: 27.7 pg (ref 26.0–34.0)
MCHC: 30.9 g/dL (ref 30.0–36.0)
MCV: 89.5 fL (ref 80.0–100.0)
Monocytes Absolute: 1.1 K/uL — ABNORMAL HIGH (ref 0.1–1.0)
Monocytes Relative: 13 %
Neutro Abs: 4.8 K/uL (ref 1.7–7.7)
Neutrophils Relative %: 54 %
Platelets: 215 K/uL (ref 150–400)
RBC: 3.9 MIL/uL (ref 3.87–5.11)
RDW: 18.6 % — ABNORMAL HIGH (ref 11.5–15.5)
WBC: 9 K/uL (ref 4.0–10.5)
nRBC: 0 % (ref 0.0–0.2)

## 2024-06-18 LAB — URINALYSIS, ROUTINE W REFLEX MICROSCOPIC
Bilirubin Urine: NEGATIVE
Glucose, UA: NEGATIVE mg/dL
Hgb urine dipstick: NEGATIVE
Ketones, ur: NEGATIVE mg/dL
Leukocytes,Ua: NEGATIVE
Nitrite: NEGATIVE
Protein, ur: NEGATIVE mg/dL
Specific Gravity, Urine: 1.01 (ref 1.005–1.030)
pH: 5 (ref 5.0–8.0)

## 2024-06-18 LAB — BASIC METABOLIC PANEL WITH GFR
Anion gap: 7 (ref 5–15)
BUN: 12 mg/dL (ref 8–23)
CO2: 30 mmol/L (ref 22–32)
Calcium: 8.4 mg/dL — ABNORMAL LOW (ref 8.9–10.3)
Chloride: 102 mmol/L (ref 98–111)
Creatinine, Ser: 1.03 mg/dL — ABNORMAL HIGH (ref 0.44–1.00)
GFR, Estimated: 57 mL/min — ABNORMAL LOW (ref >60)
Glucose, Bld: 64 mg/dL — ABNORMAL LOW (ref 70–99)
Potassium: 4.4 mmol/L (ref 3.5–5.1)
Sodium: 139 mmol/L (ref 135–145)

## 2024-06-18 LAB — PHOSPHORUS: Phosphorus: 3.9 mg/dL (ref 2.5–4.6)

## 2024-06-18 LAB — MAGNESIUM: Magnesium: 1.7 mg/dL (ref 1.7–2.4)

## 2024-06-18 LAB — SURGICAL PATHOLOGY

## 2024-06-18 MED ORDER — INSULIN ASPART PROT & ASPART (70-30 MIX) 100 UNIT/ML ~~LOC~~ SUSP
20.0000 [IU] | Freq: Every day | SUBCUTANEOUS | Status: DC
  Administered 2024-06-18 – 2024-06-20 (×3): 20 [IU] via SUBCUTANEOUS

## 2024-06-18 MED ORDER — SODIUM CHLORIDE 0.9 % IV SOLN: INTRAVENOUS | Status: DC

## 2024-06-18 MED ORDER — INSULIN ASPART PROT & ASPART (70-30 MIX) 100 UNIT/ML ~~LOC~~ SUSP
15.0000 [IU] | Freq: Every day | SUBCUTANEOUS | Status: DC
  Administered 2024-06-19 – 2024-06-20 (×2): 15 [IU] via SUBCUTANEOUS
  Filled 2024-06-18: qty 10

## 2024-06-18 NOTE — Progress Notes (Addendum)
  Progress Note    06/18/2024 8:28 AM 2 Days Post-Op  Subjective: No complaints, denies pain in the right foot    Vitals:   06/18/24 0408 06/18/24 0822  BP: (!) 126/48 (!) 141/52  Pulse: 64 85  Resp: 20 15  Temp: 98.3 F (36.8 C) 99.1 F (37.3 C)  SpO2: 99% 96%    Physical Exam: General: Laying in bed, sleepy Lungs: Nonlabored Incisions: Right groin with Prevena VAC with good seal, no evidence of hematoma Extremities: RLE well-perfused with brisk PT Doppler signal   CBC    Component Value Date/Time   WBC 9.0 06/17/2024 0334   RBC 4.29 06/17/2024 0334   HGB 11.7 (L) 06/17/2024 0334   HCT 37.5 06/17/2024 0334   PLT 196 06/17/2024 0334   MCV 87.4 06/17/2024 0334   MCH 27.3 06/17/2024 0334   MCHC 31.2 06/17/2024 0334   RDW 18.5 (H) 06/17/2024 0334    BMET    Component Value Date/Time   NA 139 06/18/2024 0348   K 4.4 06/18/2024 0348   CL 102 06/18/2024 0348   CO2 30 06/18/2024 0348   GLUCOSE 64 (L) 06/18/2024 0348   BUN 12 06/18/2024 0348   CREATININE 1.03 (H) 06/18/2024 0348   CALCIUM  8.4 (L) 06/18/2024 0348   GFRNONAA 57 (L) 06/18/2024 0348    INR    Component Value Date/Time   INR 1.0 06/10/2024 1436     Intake/Output Summary (Last 24 hours) at 06/18/2024 9171 Last data filed at 06/17/2024 2044 Gross per 24 hour  Intake 840 ml  Output --  Net 840 ml      Assessment/Plan:  78 y.o. female is 2 days postop, s/p:right common femoral and SFA endarterectomy with bovine patch angioplasty    - She is doing well this morning without any complaints -Right lower extremity is well-perfused with a brisk PT Doppler signal.  She denies any rest pain in the right foot -Right groin with Prevena VAC with good seal -Continue to mobilize with PT/OT.  Initial evaluation with recommendations for home health PT/OT -Likely stable for discharge home in the next couple of days after improved clinical status and mobility   Ahmed Holster, [place] [facility] [phone] 06/18/2024 8:28 AM    VASCULAR STAFF ADDENDUM: I have independently interviewed and examined the patient. I agree with the above.   Norman GORMAN Serve MD [facility] Phone Number: [phone] 06/18/2024 8:37 AM

## 2024-06-18 NOTE — Progress Notes (Signed)
 PROGRESS NOTE    Sara [name]  FMW:[id number] DOB: 1946-01-03 DOA: 06/16/2024 PCP: Verdia Lombard, MD   Brief Narrative:  The patient is a 78 year old Hispanic female with a past medical history significant for but not limited to essential hypertension, hyperlipidemia, diabetes mellitus type 2, history of CVA, history of PAD, hypothyroidism, CKD stage II, anemia, obesity and other comorbidities who presented for right extremity PAD with claudication and underwent right femoral endarterectomy.  She is postoperative day 2 and TRH has been asked to assist with medical management of her other comorbid conditions.  Postoperatively her renal function is slightly worsened and she did spike a temperature of 100.7.  Current workup is underway and she was started on gentle IV fluid hydration with normal saline at 75 mL/hr.  Assessment and Plan:  PAD / Claudication: Presented for right lower extremity femoral arterectomy which has been successfully performed Vascular Surgery on 06/16/24 -Management per Vascular Surgery; Remains on aspirin  and atorvastatin .  Her right lower extremity is well-perfused with brisk PT Doppler signal; Vascular Surgery feels that is okay to mobilize the patient with PT/OT and they are recommending Home Health   Essential Hypertension: Continue home Losartan  100 mg po daily but discontinue Hydrochlorothiazide  25 mg po daily.  With IV hydralazine  5 mg 21 drop for high blood pressure for 2 doses and also has IV labetalol  10 mg every 10 minutes for high blood pressure for 4 doses. CTM BP per Protocol. Last BP reading was 119/49    Hyperlipidemia: Lipid panel during this admission showed a total Cholesterol/HDL ratio 2.8, cholesterol level 76, HDL 27, LDL 32, Triglycerides of 85, VLDL 17. Continue Atorvastatin  40 mg po Daily.    History of CVA: Continue home ASA 81 mg po Daily and Atorvastatin  40 mg po Daily    Hypothyroidism: Check TSH in the AM. C/w Levothyroxine  50 mcg po  Daily    AKI on CKD 2: BUN/Cr Trend went from 15/0.90 -> 9/074 -> 12/1.03. Start NS @ 75 mL/hr and Checking U/A; If persists will check Urine Na+, Urine Cr, and Urine Osm. Avoid Nephrotoxic Medications, Contrast Dyes, Hypotension and Dehydration to Ensure Adequate Renal Perfusion and will need to Renally Adjust Meds. CTM and Trend Renal Function carefully and repeat CMP in the AM   HypoKalemia: Improved. K+ was 3.4 and improved to 4.4. CTM and Replete as Necessary. Repeat CMP in the AM  Fever: Unclear Etiology. Spiked a Tmax of 100.7 this AM. Check Blood Cx x2. U/A and CXR. Started NS @ 75 mL/hr. CTM and repeat CBC in the AM   Diabetes Mellitus Type 2, Uncontrolled: HbA1c is 9.0.  Patient takes 70/30; Started her on 70/30 20 units in the a.m. and 30 units in the p.m (50 units at bedtime as home dose) but will reduce doses to 15 units in the AM and 20 units qHS.  She was ordered for a resistant NovoLog  sign scale AC as well as a moderate NovoLog  sliding scale AC.  Will discontinue the resistant scale.  Will continue Metformin  1 g twice daily.  When she will be n.p.o. she will need to discontinue her 70/30 and continue with Semglee.  Continue monitor CBGs per protocol.  CBG range:  Recent Labs  Lab 06/17/24 1203 06/17/24 1552 06/17/24 2119 06/18/24 0614 06/18/24 0634 06/18/24 0648 06/18/24 1128  GLUCAP 96 151* 220* 55* 65* 66* 152*   Normocytic Anemia/ABLA as a postoperative Cause: Hgb/Hct Trend:  Recent Labs  Lab 06/10/24 1436 06/16/24 1549 06/17/24 0334  06/18/24 0348  HGB 14.7 11.7* 11.7* 10.8*  HCT 46.6* 37.1 37.5 34.9*  MCV 85.8 86.3 87.4 89.5  -Check Anemia Panel in the AM. CTM for S/Sx of Bleeding; No overt bleeding noted. Repeat CBC in the AM  Hypoalbuminemia: Patient's Albumin  Lvl went from 3.6 -> 3.1. CTM and Trend and Repeat CMP in the AM  Class II Obesity: Complicates overall prognosis and care. Estimated body mass index is 37.49 kg/m as calculated from the following:    Height as of this encounter: 5' 2 (1.575 m).   Weight as of this encounter: 93 kg. Weight Loss and Dietary Counseling given   DVT prophylaxis: heparin  injection 5,000 Units Start: 06/17/24 0600 SCD's Start: 06/16/24 1504    Code Status: Full Code Family Communication: No family currently at bedside  Disposition Plan:  Level of care: Progressive Status is: Inpatient Remains inpatient appropriate because: Needs further clinical improvement workup of her Fever and AKI  Consultants:  Vascular Surgery (primary) TRH  Procedures:  Right common femoral and SFA endarterectomy with patch angioplasty postoperative Day 2  Antimicrobials:  Anti-infectives (From admission, onward)    Start     Dose/Rate Route Frequency Ordered Stop   06/16/24 1800  ceFAZolin  (ANCEF ) IVPB 2g/100 mL premix        2 g 200 mL/hr over 30 Minutes Intravenous Every 8 hours 06/16/24 1503 06/17/24 0259   06/16/24 0654  ceFAZolin  (ANCEF ) IVPB 2g/100 mL premix        2 g 200 mL/hr over 30 Minutes Intravenous 30 min pre-op 06/16/24 0654 06/16/24 0955       Subjective: Seen and examined at bedside and she states that she is doing fairly well and had pain earlier this morning but is improved after pain medications.  Subsequently  later this morning she had a fever of unknown etiology.  She denied any nausea or vomiting.  No lightheadedness or dizziness.  Denies any other concerns or complaints at this time.  Objective: Vitals:   06/18/24 1126 06/18/24 1128 06/18/24 1253 06/18/24 1257  BP: (!) 126/24  (!) 119/49 (!) 119/49  Pulse: 89  81 84  Resp: 16  14 17   Temp: (!) 100.7 F (38.2 C)  99.4 F (37.4 C) 98.4 F (36.9 C)  TempSrc: Oral  Oral Oral  SpO2: (!) 82% 97% 99% 98%  Weight:      Height:        Intake/Output Summary (Last 24 hours) at 06/18/2024 1438 Last data filed at 06/18/2024 0857 Gross per 24 hour  Intake 600 ml  Output --  Net 600 ml   Filed Weights   06/16/24 0801  Weight: 93 kg    Examination: Physical Exam:  Constitutional: WN/WD obese Hispanic female in no acute distress Respiratory: Diminished to auscultation bilaterally, no wheezing, rales, rhonchi or crackles. Normal respiratory effort and patient is not tachypenic. No accessory muscle use.  Unlabored breathing Cardiovascular: RRR, no murmurs / rubs / gallops. S1 and S2 auscultated. No extremity edema.   Abdomen: Soft, non-tender, distended secondary to body habitus. Bowel sounds positive.  GU: Deferred. Musculoskeletal: No clubbing / cyanosis of digits/nails. No joint deformity upper and lower extremities.  Skin: No rashes, lesions, ulcers on limited skin evaluation. No induration; Warm and dry.  Has a wound VAC to the right groins Neurologic: CN 2-12 grossly intact with no focal deficits. Romberg sign and cerebellar reflexes not assessed.  Psychiatric: Normal judgment and insight. Alert and oriented x 3. Normal mood and appropriate affect.  Data Reviewed: I have personally reviewed following labs and imaging studies  CBC: Recent Labs  Lab 06/16/24 1549 06/17/24 0334 06/18/24 0348  WBC 9.0 9.0 9.0  NEUTROABS  --   --  4.8  HGB 11.7* 11.7* 10.8*  HCT 37.1 37.5 34.9*  MCV 86.3 87.4 89.5  PLT 206 196 215   Basic Metabolic Panel: Recent Labs  Lab 06/16/24 1549 06/17/24 0334 06/18/24 0348  NA  --  141 139  K  --  3.4* 4.4  CL  --  102 102  CO2  --  31 30  GLUCOSE  --  60* 64*  BUN  --  9 12  CREATININE 0.69 0.74 1.03*  CALCIUM   --  8.6* 8.4*  MG  --   --  1.7  PHOS  --   --  3.9   GFR: Estimated Creatinine Clearance: 50.1 mL/min (A) (by C-G formula based on SCr of 1.03 mg/dL (H)). Liver Function Tests: Recent Labs  Lab 06/18/24 0348  AST 18  ALT 12  ALKPHOS 76  BILITOT 0.4  PROT 6.5  ALBUMIN  3.1*   No results for input(s): LIPASE, AMYLASE in the last 168 hours. No results for input(s): AMMONIA in the last 168 hours. Coagulation Profile: No results for input(s): INR,  PROTIME in the last 168 hours. Cardiac Enzymes: No results for input(s): CKTOTAL, CKMB, CKMBINDEX, TROPONINI in the last 168 hours. BNP (last 3 results) No results for input(s): PROBNP in the last 8760 hours. HbA1C: Recent Labs    06/16/24 1836  HGBA1C 9.0*   CBG: Recent Labs  Lab 06/17/24 2119 06/18/24 0614 06/18/24 0634 06/18/24 0648 06/18/24 1128  GLUCAP 220* 55* 65* 66* 152*   Lipid Profile: Recent Labs    06/17/24 0334  CHOL 76  HDL 27*  LDLCALC 32  TRIG 85  CHOLHDL 2.8   Thyroid  Function Tests: No results for input(s): TSH, T4TOTAL, FREET4, T3FREE, THYROIDAB in the last 72 hours. Anemia Panel: No results for input(s): VITAMINB12, FOLATE, FERRITIN, TIBC, IRON , RETICCTPCT in the last 72 hours. Sepsis Labs: No results for input(s): PROCALCITON, LATICACIDVEN in the last 168 hours.  Recent Results (from the past 240 hours)  Surgical pcr screen     Status: None   Collection Time: 06/10/24  2:37 PM   Specimen: Nasal Mucosa; Nasal Swab  Result Value Ref Range Status   MRSA, PCR NEGATIVE NEGATIVE Final   Staphylococcus aureus NEGATIVE NEGATIVE Final    Comment: (NOTE) The Xpert SA Assay (FDA approved for NASAL specimens in patients 19 years of age and older), is one component of a comprehensive surveillance program. It is not intended to diagnose infection nor to guide or monitor treatment. Performed at [facility], [address] [place], [place] [address]     Radiology Studies: No results found.  Scheduled Meds:  aspirin  EC  81 mg Oral Daily   atorvastatin   40 mg Oral Daily   docusate sodium   100 mg Oral Daily   feeding supplement  237 mL Oral BID BM   heparin   5,000 Units Subcutaneous Q8H   insulin  aspart  0-15 Units Subcutaneous TID WC   [START ON 06/19/2024] insulin  aspart protamine - aspart  15 Units Subcutaneous Q breakfast   And   insulin  aspart protamine - aspart  20 Units Subcutaneous Q supper    levothyroxine   50 mcg Oral QAC breakfast   losartan   100 mg Oral Daily   metFORMIN   1,000 mg Oral BID WC   Continuous  Infusions:  sodium chloride      sodium chloride      lactated ringers       LOS: 2 days   Alejandro Marker, DO [facility] Available via Epic secure chat 7am-7pm After these hours, please refer to coverage provider listed on [email or web address] 06/18/2024, 2:38 PM

## 2024-06-18 NOTE — Plan of Care (Signed)
  Problem: Education: Goal: Knowledge of General Education information will improve Description: Including pain rating scale, medication(s)/side effects and non-pharmacologic comfort measures Outcome: Progressing   Problem: Clinical Measurements: Goal: Ability to maintain clinical measurements within normal limits will improve Outcome: Progressing Goal: Diagnostic test results will improve Outcome: Progressing Goal: Respiratory complications will improve Outcome: Progressing Goal: Cardiovascular complication will be avoided Outcome: Progressing   Problem: Activity: Goal: Risk for activity intolerance will decrease Outcome: Progressing   Problem: Nutrition: Goal: Adequate nutrition will be maintained Outcome: Progressing   Problem: Coping: Goal: Level of anxiety will decrease Outcome: Progressing   Problem: Elimination: Goal: Will not experience complications related to bowel motility Outcome: Progressing Goal: Will not experience complications related to urinary retention Outcome: Progressing   Problem: Pain Managment: Goal: General experience of comfort will improve and/or be controlled Outcome: Progressing   Problem: Safety: Goal: Ability to remain free from injury will improve Outcome: Progressing   Problem: Skin Integrity: Goal: Risk for impaired skin integrity will decrease Outcome: Progressing   Problem: Coping: Goal: Ability to adjust to condition or change in health will improve Outcome: Progressing   Problem: Fluid Volume: Goal: Ability to maintain a balanced intake and output will improve Outcome: Progressing   Problem: Metabolic: Goal: Ability to maintain appropriate glucose levels will improve Outcome: Progressing   Problem: Nutritional: Goal: Maintenance of adequate nutrition will improve Outcome: Progressing   Problem: Skin Integrity: Goal: Risk for impaired skin integrity will decrease Outcome: Progressing   Problem: Tissue  Perfusion: Goal: Adequacy of tissue perfusion will improve Outcome: Progressing   Problem: Education: Goal: Knowledge of prescribed regimen will improve Outcome: Progressing   Problem: Bowel/Gastric: Goal: Gastrointestinal status for postoperative course will improve Outcome: Progressing

## 2024-06-19 ENCOUNTER — Inpatient Hospital Stay (HOSPITAL_COMMUNITY)

## 2024-06-19 DIAGNOSIS — I70229 Atherosclerosis of native arteries of extremities with rest pain, unspecified extremity: Secondary | ICD-10-CM | POA: Diagnosis not present

## 2024-06-19 DIAGNOSIS — N182 Chronic kidney disease, stage 2 (mild): Secondary | ICD-10-CM | POA: Diagnosis not present

## 2024-06-19 DIAGNOSIS — R599 Enlarged lymph nodes, unspecified: Secondary | ICD-10-CM

## 2024-06-19 DIAGNOSIS — I70221 Atherosclerosis of native arteries of extremities with rest pain, right leg: Secondary | ICD-10-CM

## 2024-06-19 DIAGNOSIS — R509 Fever, unspecified: Secondary | ICD-10-CM

## 2024-06-19 DIAGNOSIS — D649 Anemia, unspecified: Secondary | ICD-10-CM | POA: Diagnosis not present

## 2024-06-19 DIAGNOSIS — I129 Hypertensive chronic kidney disease with stage 1 through stage 4 chronic kidney disease, or unspecified chronic kidney disease: Secondary | ICD-10-CM | POA: Diagnosis not present

## 2024-06-19 LAB — VITAMIN B12: Vitamin B-12: 200 pg/mL (ref 180–914)

## 2024-06-19 LAB — GLUCOSE, CAPILLARY
Glucose-Capillary: 238 mg/dL — ABNORMAL HIGH (ref 70–99)
Glucose-Capillary: 129 mg/dL — ABNORMAL HIGH (ref 70–99)
Glucose-Capillary: 81 mg/dL (ref 70–99)
Glucose-Capillary: 83 mg/dL (ref 70–99)
Glucose-Capillary: 135 mg/dL — ABNORMAL HIGH (ref 70–99)
Glucose-Capillary: 201 mg/dL — ABNORMAL HIGH (ref 70–99)

## 2024-06-19 LAB — CBC WITH DIFFERENTIAL/PLATELET
Abs Immature Granulocytes: 0.03 K/uL (ref 0.00–0.07)
Basophils Absolute: 0 K/uL (ref 0.0–0.1)
Basophils Relative: 0 %
Eosinophils Absolute: 0.3 K/uL (ref 0.0–0.5)
Eosinophils Relative: 4 %
HCT: 34.9 % — ABNORMAL LOW (ref 36.0–46.0)
Hemoglobin: 10.7 g/dL — ABNORMAL LOW (ref 12.0–15.0)
Immature Granulocytes: 0 %
Lymphocytes Relative: 32 %
Lymphs Abs: 2.5 K/uL (ref 0.7–4.0)
MCH: 27.6 pg (ref 26.0–34.0)
MCHC: 30.7 g/dL (ref 30.0–36.0)
MCV: 89.9 fL (ref 80.0–100.0)
Monocytes Absolute: 1 K/uL (ref 0.1–1.0)
Monocytes Relative: 13 %
Neutro Abs: 3.8 K/uL (ref 1.7–7.7)
Neutrophils Relative %: 51 %
Platelets: 202 K/uL (ref 150–400)
RBC: 3.88 MIL/uL (ref 3.87–5.11)
RDW: 18.4 % — ABNORMAL HIGH (ref 11.5–15.5)
WBC: 7.6 K/uL (ref 4.0–10.5)
nRBC: 0 % (ref 0.0–0.2)

## 2024-06-19 LAB — COMPREHENSIVE METABOLIC PANEL WITH GFR
ALT: 11 U/L (ref 0–44)
AST: 14 U/L — ABNORMAL LOW (ref 15–41)
Albumin: 3 g/dL — ABNORMAL LOW (ref 3.5–5.0)
Alkaline Phosphatase: 71 U/L (ref 38–126)
Anion gap: 8 (ref 5–15)
BUN: 12 mg/dL (ref 8–23)
CO2: 28 mmol/L (ref 22–32)
Calcium: 8.3 mg/dL — ABNORMAL LOW (ref 8.9–10.3)
Chloride: 102 mmol/L (ref 98–111)
Creatinine, Ser: 0.82 mg/dL (ref 0.44–1.00)
GFR, Estimated: >60 mL/min (ref >60)
Glucose, Bld: 174 mg/dL — ABNORMAL HIGH (ref 70–99)
Potassium: 4.4 mmol/L (ref 3.5–5.1)
Sodium: 138 mmol/L (ref 135–145)
Total Bilirubin: 0.5 mg/dL (ref 0.0–1.2)
Total Protein: 6.8 g/dL (ref 6.5–8.1)

## 2024-06-19 LAB — MAGNESIUM: Magnesium: 1.7 mg/dL (ref 1.7–2.4)

## 2024-06-19 LAB — FOLATE: Folate: 14.4 ng/mL (ref >5.9)

## 2024-06-19 LAB — IRON AND TIBC
Iron: 18 ug/dL — ABNORMAL LOW (ref 28–170)
Saturation Ratios: 5 % — ABNORMAL LOW (ref 10.4–31.8)
TIBC: 356 ug/dL (ref 250–450)
UIBC: 338 ug/dL

## 2024-06-19 LAB — TSH: TSH: 0.763 u[IU]/mL (ref 0.350–4.500)

## 2024-06-19 LAB — RETICULOCYTES
Immature Retic Fract: 15.9 % (ref 2.3–15.9)
RBC.: 3.89 MIL/uL (ref 3.87–5.11)
Retic Count, Absolute: 61.1 K/uL (ref 19.0–186.0)
Retic Ct Pct: 1.6 % (ref 0.4–3.1)

## 2024-06-19 LAB — PHOSPHORUS: Phosphorus: 2.9 mg/dL (ref 2.5–4.6)

## 2024-06-19 LAB — FERRITIN: Ferritin: 27 ng/mL (ref 11–307)

## 2024-06-19 MED ORDER — SODIUM CHLORIDE 0.9 % IV SOLN
2.0000 g | Freq: Three times a day (TID) | INTRAVENOUS | Status: DC
  Administered 2024-06-19 – 2024-06-20 (×3): 2 g via INTRAVENOUS
  Filled 2024-06-19 (×3): qty 12.5

## 2024-06-19 MED ORDER — MAGNESIUM SULFATE 2 GM/50ML IV SOLN
2.0000 g | Freq: Once | INTRAVENOUS | Status: AC
  Administered 2024-06-19: 2 g via INTRAVENOUS
  Filled 2024-06-19: qty 50

## 2024-06-19 MED ORDER — POLYSACCHARIDE IRON COMPLEX 150 MG PO CAPS
150.0000 mg | ORAL_CAPSULE | Freq: Every day | ORAL | Status: DC
  Administered 2024-06-19 – 2024-06-20 (×2): 150 mg via ORAL
  Filled 2024-06-19 (×2): qty 1

## 2024-06-19 MED ORDER — VANCOMYCIN HCL IN DEXTROSE 1-5 GM/200ML-% IV SOLN: 1000.0000 mg | INTRAVENOUS | Status: DC

## 2024-06-19 MED ORDER — VANCOMYCIN HCL 1500 MG/300ML IV SOLN
1500.0000 mg | Freq: Once | INTRAVENOUS | Status: AC
  Administered 2024-06-19: 1500 mg via INTRAVENOUS
  Filled 2024-06-19: qty 300

## 2024-06-19 MED ORDER — SODIUM CHLORIDE 0.9 % IV BOLUS
500.0000 mL | Freq: Once | INTRAVENOUS | Status: AC
  Administered 2024-06-19: 500 mL via INTRAVENOUS

## 2024-06-19 NOTE — Progress Notes (Addendum)
  Progress Note    06/19/2024 6:56 AM 3 Days Post-Op  Subjective:  says foot feels better; says she did not walk yesterday but did the day before.  She has been up to the [facility]  Afebrile   Vitals:   06/18/24 2341 06/19/24 0406  BP: (!) 141/61 (!) 128/47  Pulse: 65   Resp: 15   Temp: 98.3 F (36.8 C) 98.2 F (36.8 C)  SpO2: 100% 99%    Physical Exam: General:  no distress Cardiac:  regular Lungs:  non labored on O2NC Incisions:  right groin with Prevena in place with good seal Extremities:  brisk doppler flow right DP/PT; right calf is soft Abdomen:  soft  CBC    Component Value Date/Time   WBC 7.6 06/19/2024 0352   RBC 3.88 06/19/2024 0352   RBC 3.89 06/19/2024 0352   HGB 10.7 (L) 06/19/2024 0352   HCT 34.9 (L) 06/19/2024 0352   PLT 202 06/19/2024 0352   MCV 89.9 06/19/2024 0352   MCH 27.6 06/19/2024 0352   MCHC 30.7 06/19/2024 0352   RDW 18.4 (H) 06/19/2024 0352   LYMPHSABS 2.5 06/19/2024 0352   MONOABS 1.0 06/19/2024 0352   EOSABS 0.3 06/19/2024 0352   BASOSABS 0.0 06/19/2024 0352    BMET    Component Value Date/Time   NA 138 06/19/2024 0352   K 4.4 06/19/2024 0352   CL 102 06/19/2024 0352   CO2 28 06/19/2024 0352   GLUCOSE 174 (H) 06/19/2024 0352   BUN 12 06/19/2024 0352   CREATININE 0.82 06/19/2024 0352   CALCIUM  8.3 (L) 06/19/2024 0352   GFRNONAA >60 06/19/2024 0352    INR    Component Value Date/Time   INR 1.0 06/10/2024 1436     Intake/Output Summary (Last 24 hours) at 06/19/2024 0656 Last data filed at 06/19/2024 0408 Gross per 24 hour  Intake 2253.88 ml  Output 600 ml  Net 1653.88 ml      Assessment/Plan:  78 y.o. female is s/p:  Right common femoral and SFA endarterectomy with patch angioplasty on 06/16/2024 by Dr. Pearline for CLTI with rest pain  3 Days Post-Op   -pt with brisk doppler flow right DP/PT and Prevena is in place with good seal -needs to be out of bed to chair and mobilizing.  Order placed for OOB to chair tid and  mobilize tid.  -still requiring 2LO2NC -acute blood loss anemia-stable -DVT prophylaxis:  sq heparin    Lucie Apt, PA-C [facility] [phone] 06/19/2024 6:56 AM  VASCULAR STAFF ADDENDUM: Needs to ambulate as much as possible this morning.  AKI resolved Should be able to discharge later today   Norman GORMAN Pearline MD [facility] Phone Number: [phone] 06/19/2024 8:31 AM

## 2024-06-19 NOTE — Progress Notes (Signed)
 Occupational Therapy Treatment Patient Details Name: Sara [name] MRN: [id number] DOB: Apr 10, 1946 Today's Date: 06/19/2024   History of present illness Pt is a 79 y.o. female admitted 8/5 for scheduled R femoral endarterectomy. S/p Right common femoral and SFA endarterectomy with patch angioplasty 8/5. PMH: HTN, HLD, DM, CVA, PAD, hypothyroidism, CKD 2, anemia, obesity   OT comments  Pt received in bed reporting fatigue and weakness d/t fever. Pt declining ADLs but agreeable to transfer training. Pt completing transfer with CGA and increased time using RW. Pt continues to be limited by decreased balance, strength, and activity tolerance. Continue to recommend HHOT to optimize independence levels. Will continue to follow acutely.       If plan is discharge home, recommend the following:  A little help with walking and/or transfers;A little help with bathing/dressing/bathroom   Equipment Recommendations  None recommended by OT    Recommendations for Other Services      Precautions / Restrictions Precautions Precautions: Fall;Other (comment) Recall of Precautions/Restrictions: Intact Restrictions Weight Bearing Restrictions Per Provider Order: No       Mobility Bed Mobility Overal bed mobility: Needs Assistance Bed Mobility: Supine to Sit     Supine to sit: Min assist     General bed mobility comments: Min assist for managing BLEs    Transfers Overall transfer level: Needs assistance Equipment used: Rolling walker (2 wheels) Transfers: Sit to/from Stand, Bed to chair/wheelchair/BSC Sit to Stand: Contact guard assist     Step pivot transfers: Contact guard assist     General transfer comment: slow to come to stand, and complete step pivot to recliner     Balance Overall balance assessment: Needs assistance Sitting-balance support: No upper extremity supported, Feet supported Sitting balance-Leahy Scale: Fair     Standing balance support: During functional  activity, Reliant on assistive device for balance, Bilateral upper extremity supported Standing balance-Leahy Scale: Fair         ADL either performed or assessed with clinical judgement   ADL Overall ADL's : Needs assistance/impaired       Toilet Transfer: Contact guard assist;Rolling walker (2 wheels);Ambulation Toilet Transfer Details (indicate cue type and reason): Simulated in room         Functional mobility during ADLs: Contact guard assist;Rolling walker (2 wheels) General ADL Comments: Fatigue limiting    Extremity/Trunk Assessment Upper Extremity Assessment Upper Extremity Assessment: Generalized weakness   Lower Extremity Assessment Lower Extremity Assessment: Defer to PT evaluation        Vision   Vision Assessment?: No apparent visual deficits         Communication Communication Communication: No apparent difficulties   Cognition Arousal: Alert Behavior During Therapy: WFL for tasks assessed/performed Cognition: No apparent impairments       Following commands: Intact        Cueing   Cueing Techniques: Verbal cues        General Comments Pt reporting fatigue and weakness from fever, BP recorded at end of session, 164/ 67    Pertinent Vitals/ Pain       Pain Assessment Pain Assessment: Faces Faces Pain Scale: Hurts even more Pain Location: Generalized weakness Pain Descriptors / Indicators: Discomfort, Grimacing Pain Intervention(s): Limited activity within patient's tolerance   Frequency  Min 2X/week        Progress Toward Goals  OT Goals(current goals can now be found in the care plan section)  Progress towards OT goals: Progressing toward goals  Acute Rehab OT Goals Patient Stated Goal:  To feel better OT Goal Formulation: With patient Time For Goal Achievement: 07/01/24 Potential to Achieve Goals: Good ADL Goals Pt Will Perform Grooming: standing;with modified independence Pt Will Perform Lower Body Dressing: sit  to/from stand;with modified independence Pt Will Transfer to Toilet: ambulating;regular height toilet;with modified independence Pt Will Perform Toileting - Clothing Manipulation and hygiene: with modified independence;sitting/lateral leans;sit to/from stand Pt Will Perform Tub/Shower Transfer: Tub transfer;with supervision;ambulating;shower seat  Plan         AM-PAC OT 6 Clicks Daily Activity     Outcome Measure   Help from another person eating meals?: None Help from another person taking care of personal grooming?: A Little Help from another person toileting, which includes using toliet, bedpan, or urinal?: A Little Help from another person bathing (including washing, rinsing, drying)?: A Little Help from another person to put on and taking off regular upper body clothing?: A Little Help from another person to put on and taking off regular lower body clothing?: A Little 6 Click Score: 19    End of Session Equipment Utilized During Treatment: Gait belt;Rolling walker (2 wheels)  OT Visit Diagnosis: Unsteadiness on feet (R26.81);Other abnormalities of gait and mobility (R26.89);Muscle weakness (generalized) (M62.81)   Activity Tolerance Patient tolerated treatment well   Patient Left in chair;with call bell/phone within reach   Nurse Communication Mobility status        Time: 8958-8943 OT Time Calculation (min): 15 min  Charges: OT General Charges $OT Visit: 1 Visit OT Treatments $Self Care/Home Management : 8-22 mins  Adrianne BROCKS, OT  Acute Rehabilitation Services Office [phone] Secure chat preferred   Adrianne GORMAN Savers 06/19/2024, 1:03 PM

## 2024-06-19 NOTE — TOC Initial Note (Signed)
 Transition of Care (TOC) - Initial/Assessment Note  Rayfield Gobble RN, BSN Inpatient Care Management Unit 4E- RN Case Manager See Treatment Team for direct phone #   Patient Details  Name: Sara [name] MRN: [id number] Date of Birth: [birth year]  Transition of Care [facility]) CM/SW Contact:    Gobble Rayfield Hurst, RN Phone Number: 06/19/2024, 3:59 PM  Clinical Narrative:                 Pt s/p femoral and SFA endarterectomy. From home w/ spouse.   CM was notified by [facility] liaison that [facility] office made pre-op referral for [facility] needs- note recs for HHRN/PT/OT/aide- will need orders placed for discharge [facility] liaison following   Pt has Prevena VAC.   Also note recs for DME- RW- will need order as well as possible home 02? Will need to f/u with ambulatory sat check and documentation to assess 02 needs prior to discharge.   IP CM to continue to follow.   Expected Discharge Plan: Home w Home Health Services Barriers to Discharge: Continued Medical Work up   Patient Goals and CMS Choice Patient states their goals for this hospitalization and ongoing recovery are:: return home          Expected Discharge Plan and Services   Discharge Planning Services: CM Consult Post Acute Care Choice: Durable Medical Equipment, Home Health Living arrangements for the past 2 months: Apartment                           HH Arranged: RN, PT, OT, Nurse's Aide   Date HH Agency Contacted: 06/19/24 Time HH Agency Contacted: 1559 Representative spoke with at [facility] Agency: Zebedee  Prior Living Arrangements/Services Living arrangements for the past 2 months: Apartment Lives with:: Spouse Patient language and need for interpreter reviewed:: Yes        Need for Family Participation in Patient Care: Yes (Comment) Care giver support system in place?: Yes (comment)   Criminal Activity/Legal Involvement Pertinent to Current Situation/Hospitalization: No - Comment as needed  Activities of  Daily Living   ADL Screening (condition at time of admission) Independently performs ADLs?: No Does the patient have a NEW difficulty with bathing/dressing/toileting/self-feeding that is expected to last >3 days?: No Does the patient have a NEW difficulty with getting in/out of bed, walking, or climbing stairs that is expected to last >3 days?: No Does the patient have a NEW difficulty with communication that is expected to last >3 days?: No Is the patient deaf or have difficulty hearing?: No Does the patient have difficulty seeing, even when wearing glasses/contacts?: Yes Does the patient have difficulty concentrating, remembering, or making decisions?: No  Permission Sought/Granted                  Emotional Assessment       Orientation: : Oriented to Self, Oriented to Place, Oriented to  Time, Oriented to Situation Alcohol / Substance Use: Not Applicable Psych Involvement: No (comment)  Admission diagnosis:  Peripheral vascular disease, unspecified (HCC) [I73.9] Claudication in peripheral vascular disease (HCC) [I73.9] Critical lower limb ischemia (HCC) [I70.229] Patient Active Problem List   Diagnosis Date Noted   Claudication in peripheral vascular disease (HCC) 06/16/2024   Critical lower limb ischemia (HCC) 06/16/2024   Postmenopausal bleeding 05/30/2022   Cerebral infarction (HCC) 05/24/2022   Menopause 05/24/2022   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)  05/24/2022   Stroke (HCC) 05/24/2022   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   Transient arterial retinal occlusion 05/24/2022   Chronic kidney disease, stage 2 (mild) 05/24/2022   Obesity (BMI 35.0-39.9 without comorbidity) 05/24/2022   Cardiac murmur 05/24/2022   Anemia 04/25/2022   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 [place] [address] Phone: [phone] Fax: [phone]  [name] [address] [place] [address] Phone: [phone] Fax: [phone]     Social Drivers of Health (SDOH) Social History: SDOH Screenings   Food Insecurity: Food Insecurity Present (06/16/2024)  Housing: High Risk (06/16/2024)  Transportation Needs: No Transportation Needs (06/16/2024)  Utilities: Not At Risk (06/16/2024)  Social Connections: Moderately Isolated (06/16/2024)  Tobacco Use: Medium Risk (06/16/2024)   SDOH Interventions:     Readmission Risk Interventions     No data to display

## 2024-06-19 NOTE — Progress Notes (Signed)
 Physical Therapy Treatment Patient Details Name: Sara [name] MRN: [id number] DOB: [birth year] Today's Date: 06/19/2024   History of Present Illness Pt is a 78 y.o. female admitted 8/5 for scheduled R femoral endarterectomy. S/p Right common femoral and SFA endarterectomy with patch angioplasty 8/5. PMH: HTN, HLD, DM, CVA, PAD, hypothyroidism, CKD 2, anemia, obesity    PT Comments  Pt reclined in recliner.  Reports feeling poorly from her fever.  C/o mild dizziness in standing.  Orthostatic vitals taken see below.  Pt continues to require CGA overall for mobility.  Pt eager to improve.    Orthostatic VS for the past 24 hrs (Last 3 readings):  BP- Lying BP- Sitting BP- Standing at 0 minutes BP- Standing at 3 minutes  06/19/24 1208 118/48 114/49 96/53 101/51      If plan is discharge home, recommend the following: A little help with walking and/or transfers;A little help with bathing/dressing/bathroom;Assistance with cooking/housework;Assist for transportation   Can travel by private vehicle        Equipment Recommendations  Rolling walker (2 wheels);Other (comment) (O2)    Recommendations for Other Services       Precautions / Restrictions Precautions Precautions: Fall;Other (comment) Recall of Precautions/Restrictions: Intact Precaution/Restrictions Comments: R groin incision, watch BP Restrictions Weight Bearing Restrictions Per Provider Order: No     Mobility  Bed Mobility               General bed mobility comments: Seated in recliner    Transfers Overall transfer level: Needs assistance Equipment used: Rolling walker (2 wheels) Transfers: Sit to/from Stand Sit to Stand: Contact guard assist           General transfer comment: Cues for hand placement to and from seated surface.  Reports mild dizziness in standing.    Ambulation/Gait Ambulation/Gait assistance: Contact guard assist Gait Distance (Feet): 120 Feet Assistive device: Rolling walker (2  wheels) Gait Pattern/deviations: Decreased stride length, Step-through pattern, Trunk flexed       General Gait Details: O2 sats stable on 2L [place].  Cues for posture and close proxmity to RW.   Stairs             Wheelchair Mobility     Tilt Bed    Modified Rankin (Stroke Patients Only)       Balance     Sitting balance-Leahy Scale: Fair       Standing balance-Leahy Scale: Fair                              Hotel manager: No apparent difficulties  Cognition Arousal: Alert Behavior During Therapy: WFL for tasks assessed/performed                             Following commands: Intact      Cueing Cueing Techniques: Verbal cues  Exercises Other Exercises Other Exercises: Incentive Spirometer x 10 reps; Quality - educated to perform 10 reps every waking hour.    General Comments        Pertinent Vitals/Pain Pain Assessment Pain Assessment: No/denies pain    Home Living                          Prior Function            PT Goals (current goals can now be found in the care plan  section) Acute Rehab PT Goals Patient Stated Goal: Return home Potential to Achieve Goals: Good Progress towards PT goals: Progressing toward goals    Frequency    Min 2X/week      PT Plan      Co-evaluation              AM-PAC PT 6 Clicks Mobility   Outcome Measure  Help needed turning from your back to your side while in a flat bed without using bedrails?: A Little Help needed moving from lying on your back to sitting on the side of a flat bed without using bedrails?: A Little Help needed moving to and from a bed to a chair (including a wheelchair)?: A Little Help needed standing up from a chair using your arms (e.g., wheelchair or bedside chair)?: A Little Help needed to walk in hospital room?: A Little Help needed climbing 3-5 steps with a railing? : A Lot 6 Click Score: 17     End of Session   Activity Tolerance: Patient tolerated treatment well Patient left: in chair;with call bell/phone within reach;with chair alarm set Nurse Communication: Mobility status (orthostatic, guaze placement in groin, dizziness) PT Visit Diagnosis: Unsteadiness on feet (R26.81);Other abnormalities of gait and mobility (R26.89)     Time: 8872-8844 PT Time Calculation (min) (ACUTE ONLY): 28 min  Charges:    $Gait Training: 8-22 mins $Therapeutic Activity: 8-22 mins PT General Charges $$ ACUTE PT VISIT: 1 Visit                     Toya HAMS , PTA Acute Rehabilitation Services Office [phone]    Toya JINNY Gosling 06/19/2024, 12:17 PM

## 2024-06-19 NOTE — Progress Notes (Signed)
 PROGRESS NOTE    Sara [name]  FMW:[id number] DOB: Feb 21, 1946 DOA: 06/16/2024 PCP: Verdia Lombard, MD   Brief Narrative:  The patient is a 78 year old Hispanic female with a past medical history significant for but not limited to essential hypertension, hyperlipidemia, diabetes mellitus type 2, history of CVA, history of PAD, hypothyroidism, CKD stage II, anemia, obesity and other comorbidities who presented for right extremity PAD with claudication and underwent right femoral endarterectomy.  She is postoperative day 2 and TRH has been asked to assist with medical management of her other comorbid conditions.  Postoperatively her renal function is slightly worsened and she is spiking intermittent temperatures and undergoing infectious workup.  Will empirically place on IV antibiotics IV cefepime  and vancomycin  for now.  She was initiated on gentle IV fluid hydration but this is now stopped given her improvement.  If fevers resolved and she is stable she can likely be discharged from a medical perspective.  Assessment and Plan:  PAD / Claudication: Presented for right lower extremity femoral arterectomy which has been successfully performed Vascular Surgery on 06/16/24 -Management per Vascular Surgery; Remains on aspirin  and atorvastatin .  Her right lower extremity is well-perfused with brisk PT Doppler signal; Vascular Surgery feels that is okay to mobilize the patient with PT/OT and they are recommending Home Health when stable to D/C   Essential Hypertension: Continue home Losartan  100 mg po daily but discontinue Hydrochlorothiazide  25 mg po daily.  C/w IV Hydralazine  for high blood pressure for 2 doses and also has IV labetalol  10 mg every 10 minutes for high blood pressure for 4 doses. CTM BP per Protocol. Last BP reading was 150/78    Hyperlipidemia: Lipid panel during this admission showed a total Cholesterol/HDL ratio 2.8, cholesterol level 76, HDL 27, LDL 32, Triglycerides of 85, VLDL  17. Continue Atorvastatin  40 mg po Daily.    History of CVA: Continue home ASA 81 mg po Daily and Atorvastatin  40 mg po Daily    Hypothyroidism: Checked TSH and was 0.763. C/w Levothyroxine  50 mcg po Daily    AKI on CKD 2: BUN/Cr Trend went from 15/0.90 -> 9/074 -> 12/1.03. Start NS @ 75 mL/hr and Checking U/A; If persists will check Urine Na+, Urine Cr, and Urine Osm. Avoid Nephrotoxic Medications, Contrast Dyes, Hypotension and Dehydration to Ensure Adequate Renal Perfusion and will need to Renally Adjust Meds. CTM and Trend Renal Function carefully and repeat CMP in the AM   HypoKalemia: Improved. K+ was 3.4 and improved to 4.4. CTM and Replete as Necessary. Repeat CMP in the AM  Fever: Unclear Etiology. Spiked a Tmax of 100.7 yesterday AM and had at Tmax of 101.9 today. Check Blood Cx x2 (NGTD <24 hours) . U/A (Negative) and CXR done and showed Borderline to mild cardiomegaly with slightly prominent central pulmonary vessels, correlate for arterial hypertension. No acute airspace disease. NS @ 75 mL/hr now stopped.  Empirically placed her on IV vancomycin  and cefepime  given her continued fevers.  Check lower extremity venous duplex and showed no evidence of DVTs. CTM and repeat CBC in the AM; if continues to spike temperatures will consider obtaining a CT chest/abdomen/pelvis and echocardiogram; follow cultures  Diabetes Mellitus Type 2, Uncontrolled: HbA1c is 9.0.  Patient takes 70/30; Started her on 70/30 20 units in the a.m. and 30 units in the p.m (50 units at bedtime as home dose) but will reduce doses to 15 units in the AM and 20 units qHS.  She was ordered for a resistant  NovoLog  sign scale AC as well as a moderate NovoLog  sliding scale AC.  Will discontinue the resistant scale.  Will continue Metformin  1 g twice daily.  When she will be n.p.o. she will need to discontinue her 70/30 and continue with Semglee.  Continue monitor CBGs per protocol.  CBG range:  Recent Labs  Lab 06/18/24 1530  06/18/24 2113 06/19/24 0601 06/19/24 0938 06/19/24 1213 06/19/24 1348 06/19/24 1631  GLUCAP 130* 224* 238* 129* 81 83 135*   Normocytic Anemia/ABLA as a postoperative Cause: Hgb/Hct Trend:  Recent Labs  Lab 06/10/24 1436 06/16/24 1549 06/17/24 0334 06/18/24 0348 06/19/24 0352  HGB 14.7 11.7* 11.7* 10.8* 10.7*  HCT 46.6* 37.1 37.5 34.9* 34.9*  MCV 85.8 86.3 87.4 89.5 89.9  -Check Anemia Panel in the AM. CTM for S/Sx of Bleeding; No overt bleeding noted. Repeat CBC in the AM  Hypoalbuminemia: Patient's Albumin  Lvl went from 3.6 -> 3.1 -> 3.0. CTM and Trend and Repeat CMP in the AM  Class II Obesity: Complicates overall prognosis and care. Estimated body mass index is 37.49 kg/m as calculated from the following:   Height as of this encounter: 5' 2 (1.575 m).   Weight as of this encounter: 93 kg. Weight Loss and Dietary Counseling given   DVT prophylaxis: heparin  injection 5,000 Units Start: 06/17/24 0600 SCD's Start: 06/16/24 1504    Code Status: Full Code Family Communication: No family currently at bedside  Disposition Plan:  Level of care: Progressive Status is: Inpatient Remains inpatient appropriate because: Needs further clinical improvement evaluation of her fevers as her AKI is improved   Consultants:  Vascular Surgery (primary) TRH  Procedures:  Right common femoral and SFA endarterectomy with patch angioplasty postoperative Day 3  Antimicrobials:  Anti-infectives (From admission, onward)    Start     Dose/Rate Route Frequency Ordered Stop   06/20/24 1200  vancomycin  (VANCOCIN ) IVPB 1000 mg/200 mL premix       Placed in Followed by Linked Group   1,000 mg 200 mL/hr over 60 Minutes Intravenous Every 24 hours 06/19/24 1216     06/19/24 1400  ceFEPIme  (MAXIPIME ) 2 g in sodium chloride  0.9 % 100 mL IVPB        2 g 200 mL/hr over 30 Minutes Intravenous Every 8 hours 06/19/24 1216     06/19/24 1300  vancomycin  (VANCOREADY) IVPB 1500 mg/300 mL       Placed  in Followed by Linked Group   1,500 mg 150 mL/hr over 120 Minutes Intravenous  Once 06/19/24 1216 06/19/24 1514   06/16/24 1800  ceFAZolin  (ANCEF ) IVPB 2g/100 mL premix        2 g 200 mL/hr over 30 Minutes Intravenous Every 8 hours 06/16/24 1503 06/17/24 0259   06/16/24 0654  ceFAZolin  (ANCEF ) IVPB 2g/100 mL premix        2 g 200 mL/hr over 30 Minutes Intravenous 30 min pre-op 06/16/24 0654 06/16/24 0955       Subjective: Seen and examined at bedside and she states that she was doing okay when sitting in the chair but had a fever earlier this morning and does not know why.  States that she was little confused earlier as well.  Denies any current pain.  No chest pain, shortness breath, burning or discomfort in her urine and no abdominal discomfort.  No other concerns or complaints at this time.  Objective: Vitals:   06/19/24 0950 06/19/24 1100 06/19/24 1214 06/19/24 1632  BP: (!) 162/62 (!) 151/55 107/64 ROLLEN)  150/78  Pulse: 75 75 67 66  Resp: 11 13 18 13   Temp: (!) 101.9 F (38.8 C) 98.2 F (36.8 C) 99.3 F (37.4 C) 98.6 F (37 C)  TempSrc: Oral Oral Oral Oral  SpO2: 97% 99% 100% 100%  Weight:      Height:        Intake/Output Summary (Last 24 hours) at 06/19/2024 1731 Last data filed at 06/19/2024 1314 Gross per 24 hour  Intake 2253.88 ml  Output 1000 ml  Net 1253.88 ml   Filed Weights   06/16/24 0801  Weight: 93 kg   Examination: Physical Exam:  Constitutional: WN/WD obese Hispanic female in no acute distress Respiratory: Diminished to auscultation bilaterally, no wheezing, rales, rhonchi or crackles. Normal respiratory effort and patient is not tachypenic. No accessory muscle use.  Unlabored breathing Cardiovascular: RRR, no murmurs / rubs / gallops. S1 and S2 auscultated. No extremity edema. Abdomen: Soft, non-tender, distended secondary to body habitus.  Bowel sounds positive.  GU: Deferred. Musculoskeletal: No clubbing / cyanosis of digits/nails. No joint  deformity upper and lower extremities.  Skin: No rashes, lesions, ulcers on limited skin evaluation. No induration; Warm and dry.  Neurologic: CN 2-12 grossly intact with no focal deficits. Romberg sign and cerebellar reflexes not assessed.  Psychiatric: Normal judgment and insight. Alert and oriented x 3. Normal mood and appropriate affect.   Data Reviewed: I have personally reviewed following labs and imaging studies  CBC: Recent Labs  Lab 06/16/24 1549 06/17/24 0334 06/18/24 0348 06/19/24 0352  WBC 9.0 9.0 9.0 7.6  NEUTROABS  --   --  4.8 3.8  HGB 11.7* 11.7* 10.8* 10.7*  HCT 37.1 37.5 34.9* 34.9*  MCV 86.3 87.4 89.5 89.9  PLT 206 196 215 202   Basic Metabolic Panel: Recent Labs  Lab 06/16/24 1549 06/17/24 0334 06/18/24 0348 06/19/24 0352  NA  --  141 139 138  K  --  3.4* 4.4 4.4  CL  --  102 102 102  CO2  --  31 30 28   GLUCOSE  --  60* 64* 174*  BUN  --  9 12 12   CREATININE 0.69 0.74 1.03* 0.82  CALCIUM   --  8.6* 8.4* 8.3*  MG  --   --  1.7 1.7  PHOS  --   --  3.9 2.9   GFR: Estimated Creatinine Clearance: 63 mL/min (by C-G formula based on SCr of 0.82 mg/dL). Liver Function Tests: Recent Labs  Lab 06/18/24 0348 06/19/24 0352  AST 18 14*  ALT 12 11  ALKPHOS 76 71  BILITOT 0.4 0.5  PROT 6.5 6.8  ALBUMIN  3.1* 3.0*   No results for input(s): LIPASE, AMYLASE in the last 168 hours. No results for input(s): AMMONIA in the last 168 hours. Coagulation Profile: No results for input(s): INR, PROTIME in the last 168 hours. Cardiac Enzymes: No results for input(s): CKTOTAL, CKMB, CKMBINDEX, TROPONINI in the last 168 hours. BNP (last 3 results) No results for input(s): PROBNP in the last 8760 hours. HbA1C: Recent Labs    06/16/24 1836  HGBA1C 9.0*   CBG: Recent Labs  Lab 06/19/24 0601 06/19/24 0938 06/19/24 1213 06/19/24 1348 06/19/24 1631  GLUCAP 238* 129* 81 83 135*   Lipid Profile: Recent Labs    06/17/24 0334  CHOL 76   HDL 27*  LDLCALC 32  TRIG 85  CHOLHDL 2.8   Thyroid  Function Tests: Recent Labs    06/19/24 0352  TSH 0.763   Anemia Panel: Recent  Labs    06/19/24 0352  VITAMINB12 200  FOLATE 14.4  FERRITIN 27  TIBC 356  IRON  18*  RETICCTPCT 1.6   Sepsis Labs: No results for input(s): PROCALCITON, LATICACIDVEN in the last 168 hours.  Recent Results (from the past 240 hours)  Surgical pcr screen     Status: None   Collection Time: 06/10/24  2:37 PM   Specimen: Nasal Mucosa; Nasal Swab  Result Value Ref Range Status   MRSA, PCR NEGATIVE NEGATIVE Final   Staphylococcus aureus NEGATIVE NEGATIVE Final    Comment: (NOTE) The Xpert SA Assay (FDA approved for NASAL specimens in patients 15 years of age and older), is one component of a comprehensive surveillance program. It is not intended to diagnose infection nor to guide or monitor treatment. Performed at [facility], [address], [place], [place] [address]   Culture, blood (Routine X 2) w Reflex to ID Panel     Status: None (Preliminary result)   Collection Time: 06/18/24  1:38 PM   Specimen: BLOOD  Result Value Ref Range Status   Specimen Description BLOOD RIGHT ANTECUBITAL  Final   Special Requests   Final    BOTTLES DRAWN AEROBIC AND ANAEROBIC Blood Culture results may not be optimal due to an inadequate volume of blood received in culture bottles   Culture   Final    NO GROWTH < 24 HOURS Performed at [facility], [address], [place], [place] [address]    Report Status PENDING  Incomplete  Culture, blood (Routine X 2) w Reflex to ID Panel     Status: None (Preliminary result)   Collection Time: 06/18/24  1:42 PM   Specimen: BLOOD  Result Value Ref Range Status   Specimen Description BLOOD RIGHT ANTECUBITAL  Final   Special Requests   Final    BOTTLES DRAWN AEROBIC AND ANAEROBIC Blood Culture adequate volume   Culture   Final    NO GROWTH < 24 HOURS Performed at [facility], [address], [place], [place] [address]    Report Status PENDING  Incomplete    Radiology Studies: VAS US  LOWER EXTREMITY VENOUS (DVT) Result Date: 06/19/2024  Lower Venous DVT Study Patient Name:  NAJAT OLAZABAL  Date of Exam:   06/19/2024 Medical Rec #: [id number]          Accession #:    [id number] Date of Birth: February 07, 1946          Patient Gender: F Patient Age:   89 years Exam Location:  [facility] Procedure:      VAS US  LOWER EXTREMITY VENOUS (DVT) Referring Phys: ALEJANDRO MARKER --------------------------------------------------------------------------------  Indications: Pain.  Risk Factors: RLE femoral endarterectomy on 06/16/2024. Limitations: Poor ultrasound/tissue interface and bandages/surgical site. Comparison Study: Previous exam (RLEV) on 06/14/2021 was negative for DVT Performing Technologist: Ezzie Potters RVT, RDMS  Examination Guidelines: A complete evaluation includes B-mode imaging, spectral Doppler, color Doppler, and power Doppler as needed of all accessible portions of each vessel. Bilateral testing is considered an integral part of a complete examination. Limited examinations for reoccurring indications may be performed as noted. The reflux portion of the exam is performed with the patient in reverse Trendelenburg.  +--------+---------------+---------+-----------+----------+--------------------+ RIGHT   CompressibilityPhasicitySpontaneityPropertiesThrombus Aging       +--------+---------------+---------+-----------+----------+--------------------+ CFV  unable to visualize  +--------+---------------+---------+-----------+----------+--------------------+ SFJ                                                  unable to visualize  +--------+---------------+---------+-----------+----------+--------------------+ FV Prox Full           Yes      Yes                                        +--------+---------------+---------+-----------+----------+--------------------+ FV Mid  Full           Yes      Yes                                       +--------+---------------+---------+-----------+----------+--------------------+ FV      Full           Yes      Yes                                       Distal                                                                    +--------+---------------+---------+-----------+----------+--------------------+ PFV                    Yes      Yes                  patent by                                                                 color/doppler        +--------+---------------+---------+-----------+----------+--------------------+ POP     Full           Yes      Yes                                       +--------+---------------+---------+-----------+----------+--------------------+ PTV     Full                                                              +--------+---------------+---------+-----------+----------+--------------------+ PERO    Full                                                              +--------+---------------+---------+-----------+----------+--------------------+  Right Technical Findings: Not visualized segments include CFV, SFJ.  +---------+---------------+---------+-----------+----------+--------------+ LEFT     CompressibilityPhasicitySpontaneityPropertiesThrombus Aging +---------+---------------+---------+-----------+----------+--------------+ CFV      Full           Yes      Yes                                 +---------+---------------+---------+-----------+----------+--------------+ SFJ      Full                                                        +---------+---------------+---------+-----------+----------+--------------+ FV Prox  Full           Yes      Yes                                  +---------+---------------+---------+-----------+----------+--------------+ FV Mid   Full           Yes      Yes                                 +---------+---------------+---------+-----------+----------+--------------+ FV DistalFull           Yes      Yes                                 +---------+---------------+---------+-----------+----------+--------------+ PFV      Full                                                        +---------+---------------+---------+-----------+----------+--------------+ POP      Full           Yes      Yes                                 +---------+---------------+---------+-----------+----------+--------------+ PTV      Full                                                        +---------+---------------+---------+-----------+----------+--------------+ PERO     Full                                                        +---------+---------------+---------+-----------+----------+--------------+     Summary: BILATERAL: -No evidence of popliteal cyst, bilaterally. RIGHT: - There is no evidence of deep vein thrombosis in the lower extremity. However, portions of this examination were limited- see technologist comments above.  LEFT: - There is no evidence of deep vein thrombosis in the lower extremity.  *See  table(s) above for measurements and observations. Electronically signed by Norman Serve on 06/19/2024 at 4:36:03 PM.    Final    DG CHEST PORT 1 VIEW Result Date: 06/18/2024 CLINICAL DATA:  Fever EXAM: PORTABLE CHEST 1 VIEW COMPARISON:  06/28/2023 FINDINGS: Borderline to mild cardiomegaly with slightly prominent central pulmonary vessels is. No focal opacity, pleural effusion or pneumothorax. IMPRESSION: Borderline to mild cardiomegaly with slightly prominent central pulmonary vessels, correlate for arterial hypertension. No acute airspace disease. Electronically Signed   By: Luke Bun M.D.   On: 06/18/2024 17:36   Scheduled  Meds:  aspirin  EC  81 mg Oral Daily   atorvastatin   40 mg Oral Daily   docusate sodium   100 mg Oral Daily   feeding supplement  237 mL Oral BID BM   heparin   5,000 Units Subcutaneous Q8H   insulin  aspart  0-15 Units Subcutaneous TID WC   insulin  aspart protamine - aspart  15 Units Subcutaneous Q breakfast   And   insulin  aspart protamine - aspart  20 Units Subcutaneous Q supper   iron  polysaccharides  150 mg Oral Daily   levothyroxine   50 mcg Oral QAC breakfast   losartan   100 mg Oral Daily   metFORMIN   1,000 mg Oral BID WC   Continuous Infusions:  sodium chloride      ceFEPime  (MAXIPIME ) IV 2 g (06/19/24 1354)   lactated ringers      [START ON 06/20/2024] vancomycin       LOS: 3 days   Alejandro Marker, DO [facility] Available via Epic secure chat 7am-7pm After these hours, please refer to coverage provider listed on [email or web address] 06/19/2024, 5:31 PM

## 2024-06-19 NOTE — Progress Notes (Signed)
 BLE venous duplex has been completed.   Results can be found under chart review under CV PROC. 06/19/2024 4:07 PM Eily Louvier RVT, RDMS

## 2024-06-19 NOTE — Progress Notes (Signed)
 Called by RN this morning that pt spiked temp of 101.9.    TRH ordered blood cx, u/a and cxr yesterday--all currently negative.   Prevena removed today and as pictured:   Incision currently looks fine.  Needs dry dressing to wick moisture to help prevent wound infection.  She does have bovine patch and may benefit from IV abx given this and fever.  Will continue to keep close eye on right groin incision.   BLE soft and non tender in thigh and lower legs.   I talked with pt and placed dry gauze at bedside.  Does not necessarily need to be taped, but tucked in groin crease at least daily and as needed.  WBC normal this am.   Appreciate TRH assistance with management.   Lucie Apt, [facility] 06/19/2024 10:13 AM

## 2024-06-19 NOTE — Progress Notes (Addendum)
 Pharmacy Antibiotic Note  Sara [name] is a 78 y.o. female admitted on 06/16/2024 with RLE claudication s/p femoral arterectomy 8/5, now with new fever, starting abx for r/o sepsis. Pharmacy has been consulted for Vancomycin  and Cefepime  dosing.  Plan: Vancomycin  1500mg  IV x 1, then 1gm IV q24h (SCr 0.82, Vd 0.5, eAUC 496) Cefepime  2gm IV q8h  Height: 5' 2 (157.5 cm) Weight: 93 kg (205 lb) IBW/kg (Calculated) : 50.1  Temp (24hrs), Avg:98.9 F (37.2 C), Min:98.2 F (36.8 C), Max:101.9 F (38.8 C)  Recent Labs  Lab 06/16/24 1549 06/17/24 0334 06/18/24 0348 06/19/24 0352  WBC 9.0 9.0 9.0 7.6  CREATININE 0.69 0.74 1.03* 0.82    Estimated Creatinine Clearance: 63 mL/min (by C-G formula based on SCr of 0.82 mg/dL).    Allergies  Allergen Reactions   Other     Eggplant causes extreme fatigue    Antimicrobials this admission: Vanc 8/8 >>  Cefepime  8/8 >>   Dose adjustments this admission:   Microbiology results: 8/7 BCx: ngtd 7/30 MRSA PCR: negative  Thank you for allowing pharmacy to be a part of this patient's care.  [employer], Pharm.D., BCPS Clinical Pharmacist Clinical phone for 06/19/2024 from 7:30-3:00 is [phone].  **Pharmacist phone directory can be found on [email or web address] listed under [facility].  06/19/2024 12:15 PM

## 2024-06-20 ENCOUNTER — Inpatient Hospital Stay (HOSPITAL_COMMUNITY)

## 2024-06-20 DIAGNOSIS — D649 Anemia, unspecified: Secondary | ICD-10-CM | POA: Diagnosis not present

## 2024-06-20 DIAGNOSIS — J9601 Acute respiratory failure with hypoxia: Secondary | ICD-10-CM

## 2024-06-20 DIAGNOSIS — R42 Dizziness and giddiness: Secondary | ICD-10-CM

## 2024-06-20 DIAGNOSIS — I70229 Atherosclerosis of native arteries of extremities with rest pain, unspecified extremity: Secondary | ICD-10-CM | POA: Diagnosis not present

## 2024-06-20 DIAGNOSIS — N182 Chronic kidney disease, stage 2 (mild): Secondary | ICD-10-CM | POA: Diagnosis not present

## 2024-06-20 LAB — COMPREHENSIVE METABOLIC PANEL WITH GFR
ALT: 12 U/L (ref 0–44)
AST: 14 U/L — ABNORMAL LOW (ref 15–41)
Albumin: 3.1 g/dL — ABNORMAL LOW (ref 3.5–5.0)
Alkaline Phosphatase: 78 U/L (ref 38–126)
Anion gap: 6 (ref 5–15)
BUN: 8 mg/dL (ref 8–23)
CO2: 32 mmol/L (ref 22–32)
Calcium: 8.8 mg/dL — ABNORMAL LOW (ref 8.9–10.3)
Chloride: 102 mmol/L (ref 98–111)
Creatinine, Ser: 0.67 mg/dL (ref 0.44–1.00)
GFR, Estimated: >60 mL/min (ref >60)
Glucose, Bld: 183 mg/dL — ABNORMAL HIGH (ref 70–99)
Potassium: 4.9 mmol/L (ref 3.5–5.1)
Sodium: 140 mmol/L (ref 135–145)
Total Bilirubin: 0.5 mg/dL (ref 0.0–1.2)
Total Protein: 6.8 g/dL (ref 6.5–8.1)

## 2024-06-20 LAB — CBC WITH DIFFERENTIAL/PLATELET
Abs Immature Granulocytes: 0.03 K/uL (ref 0.00–0.07)
Basophils Absolute: 0 K/uL (ref 0.0–0.1)
Basophils Relative: 0 %
Eosinophils Absolute: 0.3 K/uL (ref 0.0–0.5)
Eosinophils Relative: 4 %
HCT: 36.7 % (ref 36.0–46.0)
Hemoglobin: 11.3 g/dL — ABNORMAL LOW (ref 12.0–15.0)
Immature Granulocytes: 0 %
Lymphocytes Relative: 27 %
Lymphs Abs: 2.2 K/uL (ref 0.7–4.0)
MCH: 27.4 pg (ref 26.0–34.0)
MCHC: 30.8 g/dL (ref 30.0–36.0)
MCV: 88.9 fL (ref 80.0–100.0)
Monocytes Absolute: 0.8 K/uL (ref 0.1–1.0)
Monocytes Relative: 10 %
Neutro Abs: 4.6 K/uL (ref 1.7–7.7)
Neutrophils Relative %: 59 %
Platelets: 256 K/uL (ref 150–400)
RBC: 4.13 MIL/uL (ref 3.87–5.11)
RDW: 18.2 % — ABNORMAL HIGH (ref 11.5–15.5)
WBC: 7.9 K/uL (ref 4.0–10.5)
nRBC: 0 % (ref 0.0–0.2)

## 2024-06-20 LAB — GLUCOSE, CAPILLARY
Glucose-Capillary: 120 mg/dL — ABNORMAL HIGH (ref 70–99)
Glucose-Capillary: 138 mg/dL — ABNORMAL HIGH (ref 70–99)
Glucose-Capillary: 108 mg/dL — ABNORMAL HIGH (ref 70–99)
Glucose-Capillary: 132 mg/dL — ABNORMAL HIGH (ref 70–99)

## 2024-06-20 LAB — MAGNESIUM: Magnesium: 1.9 mg/dL (ref 1.7–2.4)

## 2024-06-20 LAB — PHOSPHORUS: Phosphorus: 2.5 mg/dL (ref 2.5–4.6)

## 2024-06-20 MED ORDER — DIPHENHYDRAMINE HCL 25 MG PO CAPS: 50.0000 mg | ORAL_CAPSULE | Freq: Four times a day (QID) | ORAL | Status: DC | PRN

## 2024-06-20 MED ORDER — HUMULIN 70/30 (70-30) 100 UNIT/ML ~~LOC~~ SUSP: SUBCUTANEOUS | 11 refills | Status: DC

## 2024-06-20 MED ORDER — SODIUM CHLORIDE 0.9 % IV BOLUS: 500.0000 mL | Freq: Once | INTRAVENOUS | Status: DC

## 2024-06-20 MED ORDER — HUMALOG MIX 75/25 (75-25) 100 UNIT/ML ~~LOC~~ SUSP: 15.0000 [IU] | Freq: Every day | SUBCUTANEOUS | 11 refills | Status: DC

## 2024-06-20 MED ORDER — AMOXICILLIN-POT CLAVULANATE 875-125 MG PO TABS: 1.0000 | ORAL_TABLET | Freq: Two times a day (BID) | ORAL | 0 refills | Status: AC

## 2024-06-20 MED ORDER — SODIUM CHLORIDE 0.9 % IV BOLUS
1000.0000 mL | Freq: Once | INTRAVENOUS | Status: AC
  Administered 2024-06-20: 1000 mL via INTRAVENOUS

## 2024-06-20 MED ORDER — MECLIZINE HCL 12.5 MG PO TABS: 12.5000 mg | ORAL_TABLET | Freq: Three times a day (TID) | ORAL | Status: DC | PRN

## 2024-06-20 MED ORDER — OXYCODONE-ACETAMINOPHEN 5-325 MG PO TABS: 1.0000 | ORAL_TABLET | Freq: Four times a day (QID) | ORAL | 0 refills | Status: DC | PRN

## 2024-06-20 MED ORDER — MECLIZINE HCL 12.5 MG PO TABS: 12.5000 mg | ORAL_TABLET | Freq: Three times a day (TID) | ORAL | 0 refills | Status: AC | PRN

## 2024-06-20 MED ORDER — DIPHENHYDRAMINE HCL 25 MG PO CAPS
50.0000 mg | ORAL_CAPSULE | Freq: Once | ORAL | Status: AC
  Administered 2024-06-20: 50 mg via ORAL
  Filled 2024-06-20: qty 2

## 2024-06-20 NOTE — Plan of Care (Signed)

## 2024-06-20 NOTE — Progress Notes (Signed)
 Physical Therapy Treatment Patient Details Name: Sara [name] MRN: [id number] DOB: 1946/03/07 Today's Date: 06/20/2024   History of Present Illness Pt is a 78 y.o. female admitted 8/5 for scheduled R femoral endarterectomy. S/p Right common femoral and SFA endarterectomy with patch angioplasty 8/5. PMH: HTN, HLD, DM, CVA, PAD, hypothyroidism, CKD 2, anemia, obesity    PT Comments  Pt is progressing well towards goals. Currently pt is Mod I for bed mobility, sit to stand, gait and stairs. Pt utilizes RW for gait for security but is able to ambulate short distance at Mod I without RW. Due to pt current functional status, home set up and available assistance at home recommending skilled physical therapy services 3x/week in order to address strength, balance and functional mobility to decrease risk for falls, injury and re-hospitalization.       If plan is discharge home, recommend the following: Assistance with cooking/housework;Assist for transportation     Equipment Recommendations  Rolling walker (2 wheels)       Precautions / Restrictions Precautions Precautions: Fall;Other (comment) Recall of Precautions/Restrictions: Intact Precaution/Restrictions Comments: R groin incision, watch BP Restrictions Weight Bearing Restrictions Per Provider Order: No     Mobility  Bed Mobility Overal bed mobility: Modified Independent Bed Mobility: Supine to Sit     Supine to sit: Modified independent (Device/Increase time)          Transfers Overall transfer level: Modified independent Equipment used: Rolling walker (2 wheels) Transfers: Sit to/from Stand, Bed to chair/wheelchair/BSC Sit to Stand: Modified independent (Device/Increase time)   Step pivot transfers: Modified independent (Device/Increase time)       General transfer comment: slow to come to stand, and complete step pivot to recliner    Ambulation/Gait Ambulation/Gait assistance: Modified independent  (Device/Increase time) Gait Distance (Feet): 200 Feet Assistive device: Rolling walker (2 wheels), None Gait Pattern/deviations: Decreased stride length, Step-through pattern Gait velocity: decreased Gait velocity interpretation: 1.31 - 2.62 ft/sec, indicative of limited community ambulator   General Gait Details: Attempted gait without AD an no significant deviations from use of RW but pt prefers RW at this time for stability. Advised to continue to use at home for safety and comfort at this time.   Stairs Stairs: Yes Stairs assistance: Modified independent (Device/Increase time) Stair Management: One rail Right, Step to pattern Number of Stairs: 1 General stair comments: pt is aware of how to use a RW for stair navigation. Spouse uses a RW at home. Demonstrates sufficient strength to navigate step at home per home set up     Balance Overall balance assessment: Needs assistance Sitting-balance support: No upper extremity supported, Feet supported Sitting balance-Leahy Scale: Good     Standing balance support: During functional activity, Bilateral upper extremity supported Standing balance-Leahy Scale: Fair Standing balance comment: very light UE support on RW        Communication Communication Communication: Impaired Factors Affecting Communication: Hearing impaired  Cognition Arousal: Alert Behavior During Therapy: WFL for tasks assessed/performed   PT - Cognitive impairments: No apparent impairments         Following commands: Intact      Cueing Cueing Techniques: Verbal cues         Pertinent Vitals/Pain Pain Assessment Pain Assessment: Faces Faces Pain Scale: Hurts little more Pain Location: groin pain Pain Descriptors / Indicators: Discomfort, Grimacing Pain Intervention(s): Monitored during session, Limited activity within patient's tolerance     PT Goals (current goals can now be found in the care plan section) Acute  Rehab PT Goals Patient Stated  Goal: Return home PT Goal Formulation: With patient Time For Goal Achievement: 07/01/24 Potential to Achieve Goals: Good    Frequency    Min 2X/week      PT Plan  Continue with current POC        AM-PAC PT 6 Clicks Mobility   Outcome Measure  Help needed turning from your back to your side while in a flat bed without using bedrails?: None Help needed moving from lying on your back to sitting on the side of a flat bed without using bedrails?: None Help needed moving to and from a bed to a chair (including a wheelchair)?: None Help needed standing up from a chair using your arms (e.g., wheelchair or bedside chair)?: None Help needed to walk in hospital room?: None Help needed climbing 3-5 steps with a railing? : A Little 6 Click Score: 23    End of Session Equipment Utilized During Treatment: Gait belt Activity Tolerance: Patient tolerated treatment well Patient left: in chair;with call bell/phone within reach Nurse Communication: Mobility status PT Visit Diagnosis: Unsteadiness on feet (R26.81);Other abnormalities of gait and mobility (R26.89)     Time: 9055-8992 PT Time Calculation (min) (ACUTE ONLY): 23 min  Charges:    $Therapeutic Activity: 23-37 mins PT General Charges $$ ACUTE PT VISIT: 1 Visit                     Dorothyann Maier, DPT, CLT  Acute Rehabilitation Services Office: [phone] (Secure chat preferred)    Dorothyann VEAR Maier 06/20/2024, 10:12 AM

## 2024-06-20 NOTE — Progress Notes (Signed)
 Patient was sleeping in recliner when this RN went to patient room,says she is having dizziness and lethargic and doesnot fell good,she follows all commands vitals taken,oxygen  was 82% on RA increased to 94% on 2 l [place],CBG taken,PA corrie notified,advised to observe the patient for few hours before discharge

## 2024-06-20 NOTE — Progress Notes (Signed)
 PT Cancellation Note  Patient Details Name: Sara [name] MRN: [id number] DOB: 05-24-1946   Cancelled Treatment:    Reason Eval/Treat Not Completed:  (screened pt for vesitbular issues after episode of dizziness with low O2 sats. Pt had received a bolus prior per RN. pt baseline dizziness is much improved.Constant vertigo; very mild horizontal position increase with eyes open. Most likely a possible VOR issue; but would benefit from further investigation after BP/O2 sats stable. )  Sara [name], DPT, CLT  Acute Rehabilitation Services Office: [phone] (Secure chat preferred)   Sara [name] 06/20/2024, 3:29 PM

## 2024-06-20 NOTE — Progress Notes (Signed)
 PROGRESS NOTE    Sara [name]  FMW:[id number] DOB: December 28, 1945 DOA: 06/16/2024 PCP: Verdia Lombard, MD   Brief Narrative:  The patient is a 78 year old Hispanic female with a past medical history significant for but not limited to essential hypertension, hyperlipidemia, diabetes mellitus type 2, history of CVA, history of PAD, hypothyroidism, CKD stage II, anemia, obesity and other comorbidities who presented for right extremity PAD with claudication and underwent right femoral endarterectomy.  She is postoperative day 2 and TR[facility] has been asked to assist with medical management of her other comorbid conditions.  Postoperatively her renal function is slightly worsened and she is spiking intermittent temperatures and undergoing infectious workup.  Will empirically place on IV antibiotics IV cefepime  and vancomycin  for now.  She was initiated on gentle IV fluid hydration but this is now stopped given her improvement.  If fevers resolved and she is stable she can likely be discharged from a medical perspective.  She was dizzy earlier this morning but this is resolved after IV fluid bolus.  Will give her TED hose and orthostatics were checked and she did not drop now.  There may be a question of VOR dysfunction patient can be followed up in the outpatient setting with neurology and a referral has been made.  Prior to did discharge she did desaturate and will require 2 L of oxygen .  Repeat chest x-ray shows questionable pneumonia and will continue empiric antibiotic coverage and she has been transitioned to oral Augmentin  by the primary team.  Currently the plan is for her to be discharged later today and patient needs to follow-up with her PCP and neurologist in the outpatient setting.  Assessment and Plan:  PAD / Claudication: Presented for right lower extremity femoral arterectomy which has been successfully performed Vascular Surgery on 06/16/24 -Management per Vascular Surgery; Remains on aspirin   and atorvastatin .  Her right lower extremity is well-perfused with brisk PT Doppler signal; Vascular Surgery feels that is okay to mobilize the patient with PT/OT and they are recommending Home Health    Essential Hypertension: Continue home Losartan  100 mg po daily but discontinue Hydrochlorothiazide  25 mg po daily.  C/w IV Hydralazine  for high blood pressure for 2 doses and also has IV labetalol  10 mg every 10 minutes for high blood pressure for 4 doses. CTM BP per Protocol. Last BP reading was 122/53    Hyperlipidemia: Lipid panel during this admission showed a total Cholesterol/HDL ratio 2.8, cholesterol level 76, HDL 27, LDL 32, Triglycerides of 85, VLDL 17. Continue Atorvastatin  40 mg po Daily.    History of CVA: Continue home ASA 81 mg po Daily and Atorvastatin  40 mg po Daily    Hypothyroidism: Checked TSH and was 0.763. C/w Levothyroxine  50 mcg po Daily    AKI on CKD 2: BUN/Cr Trend went from 15/0.90 -> 9/074 -> 12/1.03 -> 12/0.82 -> 8/0.67. Start NS @ 75 mL/hr and U/A was negative; If persists will check Urine Na+, Urine Cr, and Urine Osm. Avoid Nephrotoxic Medications, Contrast Dyes, Hypotension and Dehydration to Ensure Adequate Renal Perfusion and will need to Renally Adjust Meds. CTM and Trend Renal Function carefully and repeat CMP within 1 week  HypoKalemia: Improved. K+ was 3.4 and improved to 4.9. CTM and Replete as Necessary. Repeat CMP within 1 week  Fever likely in the setting of suspected pneumonia: Unclear Etiology repeat x-ray shows possible pneumonia. Spiked a Tmax of 100.7 the day before yesterday AM and had at Tmax of 101.9 yesterday.  Afebrile today.  Check Blood Cx x2 (NGTD at 2 days) . U/A (Negative) and initial CXR done and showed Borderline to mild cardiomegaly with slightly prominent central pulmonary vessels, correlate for arterial hypertension. No acute airspace disease. - Repeat chest x-ray done and showed Lower lung volumes from prior exam. Vascular congestion  versus bronchovascular crowding. Question of developing patchy airspace disease at the left lung base, atelectasis versus pneumonia  -NS @ 75 mL/hr now stopped.   -Empirically placed her on IV vancomycin  and cefepime  given her continued fevers but primary team is now changing to oral Augmentin  which is appropriate.  Checked lower extremity venous duplex and showed no evidence of DVTs. CTM and repeat CBC in the AM; if continues to spike temperatures will consider obtaining a CT chest/abdomen/pelvis and echocardiogram which she is improved and likely can be discharged home today  Acute Respiratory Failure with Hypoxia: In the setting of pneumonia versus pulmonary arterial hypertension.  She desaturated on ambulatory home O2 screen and we will give her a flutter valve and incentive spirometer.  Will write for 2 L of supplemental oxygen  via nasal cannula.  She becomes dizzy with low O2 sats but this improves with 2 L.  Unlikely a PE given that she is not tachycardic and because she has a questionable pneumonia on imaging.  Will empirically treat and continue antibiotics as below and continue breathing treatments.  She can resume antibiotics in the outpatient setting with oral Augmentin .  She will need repeat chest x-ray in 3 to 6 weeks  Dizziness: Likely in the setting of vestibular ocular reflex dysfunction versus orthostasis.  Order TED hose and give her 1 L bolus and dizziness improved.  Can have outpatient evaluation with Neurology in referral has been made and will also write her for meclizine  12.5 mg 3 times daily as needed.  Was going to order a head CT but given her improvement with IV fluids we will hold off.  Since her dizziness is improved can to be discharged home with outpatient close follow-up with PCP and Neurology  Diabetes Mellitus Type 2, Uncontrolled: HbA1c is 9.0.  Patient takes 70/30; Started her on 70/30 20 units in the a.m. and 30 units in the p.m (50 units at bedtime as home dose) but  will reduce doses to 15 units in the AM and 20 units at bedtime while hospitalized and send out on 15 units BID for D/C.  She was ordered for a resistant NovoLog  sign scale AC as well as a moderate NovoLog  sliding scale AC.  Will discontinue the resistant scale.  Will continue Metformin  1 g twice daily.  When she will be n.p.o. she will need to discontinue her 70/30 and continue with Semglee.  Continue monitor CBGs per protocol.  CBG range:  Recent Labs  Lab 06/19/24 0601 06/19/24 0938 06/19/24 1213 06/19/24 1348 06/19/24 1631 06/19/24 2112 06/20/24 0658  GLUCAP 238* 129* 81 83 135* 201* 120*   Normocytic Anemia/ABLA as a postoperative Cause: Hgb/Hct Trend:  Recent Labs  Lab 06/10/24 1436 06/16/24 1549 06/17/24 0334 06/18/24 0348 06/19/24 0352 06/20/24 0359  HGB 14.7 11.7* 11.7* 10.8* 10.7* 11.3*  HCT 46.6* 37.1 37.5 34.9* 34.9* 36.7  MCV 85.8 86.3 87.4 89.5 89.9 88.9  -Check Anemia Panel in the AM. CTM for S/Sx of Bleeding; No overt bleeding noted. Repeat CBC in the AM  Hypoalbuminemia: Patient's Albumin  Lvl went from 3.6 -> 3.1 -> 3.0. CTM and Trend and Repeat CMP in the AM  Class II Obesity: Complicates overall prognosis  and care. Estimated body mass index is 37.64 kg/m as calculated from the following:   Height as of this encounter: 5' 2 (1.575 m).   Weight as of this encounter: 93.4 kg. Weight Loss and Dietary Counseling given   DVT prophylaxis: Place TED hose Start: 06/20/24 1236 heparin  injection 5,000 Units Start: 06/17/24 0600 SCD's Start: 06/16/24 1504    Code Status: Full Code Family Communication: No family currently at bedside  Disposition Plan:  Level of care: Progressive Status is: Inpatient Remains inpatient appropriate because: Currently the plan is for her to be discharged later today by the primary team   Consultants:  Vascular Surgery (primary) TR[facility]  Procedures:  Right common femoral and SFA endarterectomy with patch angioplasty postoperative Day  4  Antimicrobials:  Anti-infectives (From admission, onward)    Start     Dose/Rate Route Frequency Ordered Stop   06/20/24 1200  vancomycin  (VANCOCIN ) IVPB 1000 mg/200 mL premix  Status:  Discontinued       Placed in Followed by Linked Group   1,000 mg 200 mL/hr over 60 Minutes Intravenous Every 24 hours 06/19/24 1216 06/20/24 0950   06/20/24 0000  amoxicillin -clavulanate (AUGMENTIN ) 875-125 MG tablet        1 tablet Oral 2 times daily 06/20/24 0908 06/27/24 2359   06/19/24 1400  ceFEPIme  (MAXIPIME ) 2 g in sodium chloride  0.9 % 100 mL IVPB  Status:  Discontinued        2 g 200 mL/hr over 30 Minutes Intravenous Every 8 hours 06/19/24 1216 06/20/24 0950   06/19/24 1300  vancomycin  (VANCOREADY) IVPB 1500 mg/300 mL       Placed in Followed by Linked Group   1,500 mg 150 mL/hr over 120 Minutes Intravenous  Once 06/19/24 1216 06/19/24 1514   06/16/24 1800  ceFAZolin  (ANCEF ) IVPB 2g/100 mL premix        2 g 200 mL/hr over 30 Minutes Intravenous Every 8 hours 06/16/24 1503 06/17/24 0259   06/16/24 0654  ceFAZolin  (ANCEF ) IVPB 2g/100 mL premix        2 g 200 mL/hr over 30 Minutes Intravenous 30 min pre-op 06/16/24 0654 06/16/24 0955       Subjective: Seen and examined at bedside and is hoping to go home today.  Later this afternoon she became dizzy and was given IV fluid bolus which improved her dizziness.  PT evaluated for vestibular examination and suspect that she may have a VOR dysfunction possibly.  Will write her for some meclizine .  She denied current complaints or concerns and she has been afebrile today.  Hide at this stage she did desaturate on ambulatory home O2 screen and will require at least 2 L oxygen .  Chest x-ray today showed possible pneumonia so we will continue empiric antibiotic coverage.  Objective: Vitals:   06/20/24 1216 06/20/24 1245 06/20/24 1355 06/20/24 1609  BP: (!) 143/62 (!) 137/57 (!) 136/54 (!) 122/53  Pulse: 62 66 60 (!) 56  Resp: 12 14 12 15   Temp:  98.2 F (36.8 C)   97.7 F (36.5 C)  TempSrc: Oral   Oral  SpO2: 100% 100% 98% 99%  Weight:      Height:        Intake/Output Summary (Last 24 hours) at 06/20/2024 1756 Last data filed at 06/20/2024 1400 Gross per 24 hour  Intake 1007.8 ml  Output 300 ml  Net 707.8 ml   Filed Weights   06/16/24 0801 06/20/24 0403  Weight: 93 kg 93.4 kg  Examination: Physical Exam:  Constitutional: WN/WD obese Hispanic female in no acute distress Respiratory: Diminished to auscultation bilaterally with some coarse breath sounds and has some slight rhonchi but no pressure wheezing or rales.  Unlabored breathing and was not wearing supplemental oxygen  via nasal cannula this morning but then had to be placed on 2 L. Cardiovascular: RRR, no murmurs / rubs / gallops. S1 and S2 auscultated.  Minimal appreciable extremity edema Abdomen: Soft, non-tender, distended secondary to body habitus. Bowel sounds positive.  GU: Deferred. Musculoskeletal: No clubbing / cyanosis of digits/nails. No joint deformity upper and lower extremities. Skin: No rashes, lesions, ulcers on limited skin evaluation. No induration; Warm and dry.  Neurologic: CN 2-12 grossly intact with no focal deficits. Romberg sign and cerebellar reflexes not assessed.  Psychiatric: Normal judgment and insight. Alert and oriented x 3. Normal mood and appropriate affect.   Data Reviewed: I have personally reviewed following labs and imaging studies  CBC: Recent Labs  Lab 06/16/24 1549 06/17/24 0334 06/18/24 0348 06/19/24 0352 06/20/24 0359  WBC 9.0 9.0 9.0 7.6 7.9  NEUTROABS  --   --  4.8 3.8 4.6  HGB 11.7* 11.7* 10.8* 10.7* 11.3*  HCT 37.1 37.5 34.9* 34.9* 36.7  MCV 86.3 87.4 89.5 89.9 88.9  PLT 206 196 215 202 256   Basic Metabolic Panel: Recent Labs  Lab 06/16/24 1549 06/17/24 0334 06/18/24 0348 06/19/24 0352 06/20/24 0359  NA  --  141 139 138 140  K  --  3.4* 4.4 4.4 4.9  CL  --  102 102 102 102  CO2  --  31 30 28  32   GLUCOSE  --  60* 64* 174* 183*  BUN  --  9 12 12 8   CREATININE 0.69 0.74 1.03* 0.82 0.67  CALCIUM   --  8.6* 8.4* 8.3* 8.8*  MG  --   --  1.7 1.7 1.9  PHOS  --   --  3.9 2.9 2.5   GFR: Estimated Creatinine Clearance: 64.7 mL/min (by C-G formula based on SCr of 0.67 mg/dL). Liver Function Tests: Recent Labs  Lab 06/18/24 0348 06/19/24 0352 06/20/24 0359  AST 18 14* 14*  ALT 12 11 12   ALKPHOS 76 71 78  BILITOT 0.4 0.5 0.5  PROT 6.5 6.8 6.8  ALBUMIN  3.1* 3.0* 3.1*   No results for input(s): LIPASE, AMYLASE in the last 168 hours. No results for input(s): AMMONIA in the last 168 hours. Coagulation Profile: No results for input(s): INR, PROTIME in the last 168 hours. Cardiac Enzymes: No results for input(s): CKTOTAL, CKMB, CKMBINDEX, TROPONINI in the last 168 hours. BNP (last 3 results) No results for input(s): PROBNP in the last 8760 hours. HbA1C: No results for input(s): HGBA1C in the last 72 hours. CBG: Recent Labs  Lab 06/19/24 2112 06/20/24 0658 06/20/24 1140 06/20/24 1259 06/20/24 1655  GLUCAP 201* 120* 138* 108* 132*   Lipid Profile: No results for input(s): CHOL, HDL, LDLCALC, TRIG, CHOLHDL, LDLDIRECT in the last 72 hours. Thyroid  Function Tests: Recent Labs    06/19/24 0352  TSH 0.763   Anemia Panel: Recent Labs    06/19/24 0352  VITAMINB12 200  FOLATE 14.4  FERRITIN 27  TIBC 356  IRON  18*  RETICCTPCT 1.6   Sepsis Labs: No results for input(s): PROCALCITON, LATICACIDVEN in the last 168 hours.  Recent Results (from the past 240 hours)  Culture, blood (Routine X 2) w Reflex to ID Panel     Status: None (Preliminary result)   Collection  Time: 06/18/24  1:38 PM   Specimen: BLOOD  Result Value Ref Range Status   Specimen Description BLOOD RIGHT ANTECUBITAL  Final   Special Requests   Final    BOTTLES DRAWN AEROBIC AND ANAEROBIC Blood Culture results may not be optimal due to an inadequate volume of blood  received in culture bottles   Culture   Final    NO GROWTH 2 DAYS Performed at [facility], [address], [place], [place] [address]    Report Status PENDING  Incomplete  Culture, blood (Routine X 2) w Reflex to ID Panel     Status: None (Preliminary result)   Collection Time: 06/18/24  1:42 PM   Specimen: BLOOD  Result Value Ref Range Status   Specimen Description BLOOD RIGHT ANTECUBITAL  Final   Special Requests   Final    BOTTLES DRAWN AEROBIC AND ANAEROBIC Blood Culture adequate volume   Culture   Final    NO GROWTH 2 DAYS Performed at [facility], [address] [place], [place] [address]    Report Status PENDING  Incomplete    Radiology Studies: DG CHEST PORT 1 VIEW Result Date: 06/20/2024 CLINICAL DATA:  Shortness of breath. EXAM: PORTABLE CHEST 1 VIEW COMPARISON:  Radiograph 06/18/2024. FINDINGS: Lower lung volumes from prior exam. Vascular congestion versus bronchovascular crowding. Prominent heart size, likely accentuated by portable technique and low lung volumes. Mediastinal contours are stable. Question of developing patchy airspace disease at the left lung base. No pneumothorax. IMPRESSION: 1. Lower lung volumes from prior exam. Vascular congestion versus bronchovascular crowding. 2. Question of developing patchy airspace disease at the left lung base, atelectasis versus pneumonia. Electronically Signed   By: Andrea Gasman M.D.   On: 06/20/2024 11:00   VAS US  LOWER EXTREMITY VENOUS (DVT) Result Date: 06/19/2024  Lower Venous DVT Study Patient Name:  EMANI TAUSSIG  Date of Exam:   06/19/2024 Medical Rec #: [id number]          Accession #:    [id number] Date of Birth: October 05, 1946          Patient Gender: F Patient Age:   22 years Exam Location:  [facility] Procedure:      VAS US  LOWER EXTREMITY VENOUS (DVT) Referring Phys: ALEJANDRO MARKER --------------------------------------------------------------------------------  Indications: Pain.  Risk Factors: RLE  femoral endarterectomy on 06/16/2024. Limitations: Poor ultrasound/tissue interface and bandages/surgical site. Comparison Study: Previous exam ([facility]) on 06/14/2021 was negative for DVT Performing Technologist: Ezzie Potters RVT, RDMS  Examination Guidelines: A complete evaluation includes B-mode imaging, spectral Doppler, color Doppler, and power Doppler as needed of all accessible portions of each vessel. Bilateral testing is considered an integral part of a complete examination. Limited examinations for reoccurring indications may be performed as noted. The reflux portion of the exam is performed with the patient in reverse Trendelenburg.  +--------+---------------+---------+-----------+----------+--------------------+ RIGHT   CompressibilityPhasicitySpontaneityPropertiesThrombus Aging       +--------+---------------+---------+-----------+----------+--------------------+ CFV                                                  unable to visualize  +--------+---------------+---------+-----------+----------+--------------------+ SFJ  unable to visualize  +--------+---------------+---------+-----------+----------+--------------------+ FV Prox Full           Yes      Yes                                       +--------+---------------+---------+-----------+----------+--------------------+ FV Mid  Full           Yes      Yes                                       +--------+---------------+---------+-----------+----------+--------------------+ FV      Full           Yes      Yes                                       Distal                                                                    +--------+---------------+---------+-----------+----------+--------------------+ PFV                    Yes      Yes                  patent by                                                                 color/doppler         +--------+---------------+---------+-----------+----------+--------------------+ POP     Full           Yes      Yes                                       +--------+---------------+---------+-----------+----------+--------------------+ PTV     Full                                                              +--------+---------------+---------+-----------+----------+--------------------+ PERO    Full                                                              +--------+---------------+---------+-----------+----------+--------------------+   Right Technical Findings: Not visualized segments include CFV, SFJ.  +---------+---------------+---------+-----------+----------+--------------+ LEFT     CompressibilityPhasicitySpontaneityPropertiesThrombus Aging +---------+---------------+---------+-----------+----------+--------------+ CFV      Full  Yes      Yes                                 +---------+---------------+---------+-----------+----------+--------------+ SFJ      Full                                                        +---------+---------------+---------+-----------+----------+--------------+ FV Prox  Full           Yes      Yes                                 +---------+---------------+---------+-----------+----------+--------------+ FV Mid   Full           Yes      Yes                                 +---------+---------------+---------+-----------+----------+--------------+ FV DistalFull           Yes      Yes                                 +---------+---------------+---------+-----------+----------+--------------+ PFV      Full                                                        +---------+---------------+---------+-----------+----------+--------------+ POP      Full           Yes      Yes                                 +---------+---------------+---------+-----------+----------+--------------+ PTV      Full                                                         +---------+---------------+---------+-----------+----------+--------------+ PERO     Full                                                        +---------+---------------+---------+-----------+----------+--------------+     Summary: BILATERAL: -No evidence of popliteal cyst, bilaterally. RIGHT: - There is no evidence of deep vein thrombosis in the lower extremity. However, portions of this examination were limited- see technologist comments above.  LEFT: - There is no evidence of deep vein thrombosis in the lower extremity.  *See table(s) above for measurements and observations. Electronically signed by Norman Serve on 06/19/2024 at 4:36:03 PM.    Final    Scheduled Meds:  aspirin  EC  81 mg Oral Daily   atorvastatin   40 mg Oral Daily   docusate sodium   100 mg Oral Daily   feeding supplement  237 mL Oral BID BM   heparin   5,000 Units Subcutaneous Q8H   insulin  aspart  0-15 Units Subcutaneous TID WC   insulin  aspart protamine - aspart  15 Units Subcutaneous Q breakfast   And   insulin  aspart protamine - aspart  20 Units Subcutaneous Q supper   iron  polysaccharides  150 mg Oral Daily   levothyroxine   50 mcg Oral QAC breakfast   losartan   100 mg Oral Daily   metFORMIN   1,000 mg Oral BID WC   Continuous Infusions:  sodium chloride      lactated ringers       LOS: 4 days   Alejandro Marker, DO [facility] Available via Epic secure chat 7am-7pm After these hours, please refer to coverage provider listed on [email or web address] 06/20/2024, 5:56 PM

## 2024-06-20 NOTE — Progress Notes (Signed)
 RNCM received DME order for Oxygen .  Jermaine  at [facility] contacted with order and confirmation received.  Oxygen  to be delivered to patient's room prior to discharge home.

## 2024-06-20 NOTE — Discharge Summary (Signed)
 Bypass Discharge Summary Patient ID: Sara [name] [id number] 78 y.o. 11-Mar-1946  Admit date: 06/16/2024  Discharge date and time: 06/20/2024  Admitting Physician: Norman GORMAN Serve, MD   Discharge Physician: Norman Serve, MD  Admission Diagnoses: Peripheral vascular disease, unspecified (HCC) [I73.9] Claudication in peripheral vascular disease [facility]) [I73.9] Critical lower limb ischemia [facility]) [I70.229]  Discharge Diagnoses: Peripheral vascular disease, unspecified (HCC) [I73.9] Claudication in peripheral vascular disease (HCC) [I73.9] Critical lower limb ischemia (HCC) [I70.229]  Admission Condition: stable  Discharged Condition: fair  Indication for Admission: Sara [name] is a 78 y.o. female with CL TI with rest pain.  She was seen for follow-up in office with a CTA as she did not have palpable femoral pulses.  At that time she was noted to have worsening of her symptoms.  She was previously claudicating but started having constant right foot pain.  After review of her CT scan I offered right femoral endarterectomy with patch angioplasty.  Risks and benefits were reviewed, she expressed understanding and elected to proceed   Hospital Course: [name] was admitted on 06/16/24 and underwent Right common femoral and SFA endarterectomy with patch angioplasty  by Dr. Serve. She tolerated the surgery well and was taken to the recovery room in stable condition. Post operatively the Hospitalists were consulted for assistance with medical management.   06/17/24 no overnight issues. Her right groin remained stable with Prevena VAC with good seal, no hematoma. Right lower extremity remained well perfused and warm with brisk right PT doppler signal. She remained hemodynamically stable.  Continued on her home Losartan  100 mg po daily but discontinued Hydrochlorothiazide  25 mg po daily. With IV hydralazine  5 mg 21 drop for high blood pressure for 2 doses and also has IV labetalol  10 mg  every 10 minutes for high blood pressure for 4 doses with improvement in blood pressure. Renal function remained stable. Hypokalemia - replaced with po KCL. Her blood sugars remained relatively uncontrolled. She was continued on Metformin  BID. Sliding Scale AC continued. Encouraged to mobilize. PT/OT recommended Home Health PT/OT on discharge.   06/18/24 improved discomfort in right foot.  Right leg with continued adequate perfusion with brisk PT. Prevena with good seal. Remained hemodynamically stable. Normotensive on current antihypertensive regimen. Slight increase in Serum Creatinine. Normal saline started at 75 ml/hr. Hypokalemia improved. Spiked a fever with Tmax 100.7. blood cultures ordered. Urinalysis and chest x ray also ordered. Continued to monitor her blood sugars. Some improvement with change in SSI. Minimally mobilized, only to bed side commode.   06/19/24 Right foot still feeling much better. Right leg remaining well perfused and warm with brisk PT signal. Prevena left intact. Encouraged more mobilization and out of bed to chair. Had some desaturations so she was placed on 2 L [place]. AKI improved following NS hydration. Spiked fever again with Tmax of 101.9. All prior cultures and results negative for infection source. Prevena in groin removed to examine incision to make sure it was not source of fever. Bilateral lower extremity DVT duplex also ordered to rule out source of infection. Groin incision intact and well appearing. She was empirically started on IV antibiotics- Vancomycin  and Cefepime  with bovine patch in her groin. Orders placed for [facility] PT/OT, aide, home DME rolling walker and home oxygen .   06/20/24 feeling much better. Taking more po intake. No further fevers. All cultures, xrays and UA negative for infection source. Right groin incision intact and well appearing. Was saturating well on RA > 90% but some  desaturations so encouraged to use home O2. CBC and BMP within normal limits. No  leukocytosis. [facility] arranged for home health PT/OT and Aide needs. She remained stable for discharge home. When getting patient ready for discharge she became lethargic and dizzy. Reported to RN that she was not feeling well. She was desaturating some so placed back on 2L [place]. 1L Fluid bolus ordered and she was monitored for a couple more hours. After receiving 1 L bolus of NS she remained stable for discharge home. No further dizziness or lethargy. Oxygen  remained stable. Home Oxygen  delivered to room prior to discharge. She will resume her home medications as prescribed. PDMP was reviewed and post operative pain medication was sent to her pharmacy. She got additional prescriptions for Augmentin  x 7 days, Humalog  75/25, and meclizine . She will follow up in 2-3 weeks for incision check.   Consults: [facility]  Treatments: IV hydration, antibiotics: Ancef , vancomycin , and Cefepime , analgesia: acetaminophen  and Percocet, anticoagulation: heparin , Aspirin , therapies: PT, OT, RN, and SW, and surgery: Right common femoral and SFA endarterectomy with patch angioplasty 06/16/24   Disposition: Discharge disposition: 01-Home or Self Care       - For [facility] Registry use ---  Post-op:  Wound infection: No  Graft infection: No  Transfusion: No  If yes, 0 units given New Arrhythmia: No Patency judged by: Galerius.Gant ] Dopper only, [ ]  Palpable graft pulse, [ ]  Palpable distal pulse, [ ]  ABI inc. > 0.15, [ ]  Duplex D/C Ambulatory Status: Ambulatory with Assistance  Complications: MI: Galerius.Gant ] No, [ ]  Troponin only, [ ]  EKG or Clinical CHF: No Resp failure: Galerius.Gant ] none, [ ]  Pneumonia, [ ]  Ventilator Chg in renal function: [ X] none, [ ]  Inc. Cr > 0.5, [ ]  Temp. Dialysis, [ ]  Permanent dialysis Stroke: Galerius.Gant ] None, [ ]  Minor, [ ]  Major Return to OR: No  Reason for return to OR: [ ]  Bleeding, [ ]  Infection, [ ]  Thrombosis, [ ]  Revision  Discharge medications: Statin use:  Yes ASA use:  Yes Plavix use:  No   for medical reason high bleed risk Beta blocker use: No  for medical reason not indicated Coumadin use: No  for medical reason high bleed risk    Patient Instructions:  Allergies as of 06/20/2024       Reactions   Other    Eggplant causes extreme fatigue        Medication List     STOP taking these medications    NovoLOG  Mix 70/30 FlexPen (70-30) 100 UNIT/ML FlexPen Generic drug: insulin  aspart protamine  - aspart       TAKE these medications    amoxicillin -clavulanate 875-125 MG tablet Commonly known as: AUGMENTIN  Take 1 tablet by mouth 2 (two) times daily for 7 days.   aspirin  EC 81 MG tablet Take 81 mg by mouth daily. Swallow whole.   atorvastatin  40 MG tablet Commonly known as: LIPITOR Take 40 mg by mouth daily.   Euthyrox  50 MCG tablet Generic drug: levothyroxine  Take 50 mcg by mouth daily before breakfast.   HumaLOG  Mix 75/25 (75-25) 100 UNIT/ML Susp injection Generic drug: insulin  lispro protamine -lispro Inject 0.15 mLs (15 Units total) into the skin daily with breakfast.   HumuLIN  70/30 (70-30) 100 UNIT/ML injection Generic drug: insulin  NPH-regular Human Inject 30 units into the skin 2(two) times daily with a meal. 30 units in the morning and 50 units at night What changed:  how much to take how to take this  when to take this additional instructions   losartan -hydrochlorothiazide  50-12.5 MG tablet Commonly known as: HYZAAR Take 2 tablets by mouth daily.   metFORMIN  1000 MG tablet Commonly known as: GLUCOPHAGE  Take 1,000 mg by mouth 2 (two) times daily with a meal.   oxyCODONE -acetaminophen  5-325 MG tablet Commonly known as: PERCOCET/ROXICET Take 1 tablet by mouth every 6 (six) hours as needed for severe pain (pain score 7-10).               Discharge Care Instructions  (From admission, onward)           Start     Ordered   06/20/24 0000  Discharge wound care:       Comments: Keep incision dry for 24 hours. You can then wash  with mild soap and water, pat dry. Do not soak in bathtub, pool, etc   06/20/24 0854           Activity: activity as tolerated, no driving while on analgesics, and no heavy lifting for 6 weeks Diet: diabetic diet and low fat, low cholesterol diet Wound Care: Keep incision dry for 24 hours. You can then wash with mild soap and water, pat dry. Do not soak in bathtub, pool, etc   Follow-up with [facility] in 2-3 weeks.  Signed: Teretha Damme 06/20/2024 9:08 AM

## 2024-06-20 NOTE — Progress Notes (Signed)
SATURATION QUALIFICATIONS: (This note is used to comply with regulatory documentation for home oxygen)  Patient Saturations on Room Air at Rest = 91%  Patient Saturations on Room Air while Ambulating = 87%  Patient Saturations on 2Liters of oxygen while Ambulating = 96%  Please briefly explain why patient needs home oxygen: 

## 2024-06-20 NOTE — Progress Notes (Addendum)
  Progress Note    06/20/2024 8:44 AM 4 Days Post-Op  Subjective:  feeling better and hopeful to go home today. Sitting up in chair eating breakfast. Right groin sore but otherwise feeling good   Vitals:   06/20/24 0737 06/20/24 0809  BP: (!) 152/122 (!) 165/56  Pulse: 63 64  Resp:  16  Temp: 98.9 F (37.2 C) 98.1 F (36.7 C)  SpO2: 93% 92%   Physical Exam: Cardiac:  regular Lungs:  non labored Incisions:  Right groin incision c/d/I without swelling or hematoma. Dry gauze reapplied Extremities:  BLE with doppler DP/PT signals Abdomen:  soft Neurologic: alert and oriented  CBC    Component Value Date/Time   WBC 7.9 06/20/2024 0359   RBC 4.13 06/20/2024 0359   HGB 11.3 (L) 06/20/2024 0359   HCT 36.7 06/20/2024 0359   PLT 256 06/20/2024 0359   MCV 88.9 06/20/2024 0359   MCH 27.4 06/20/2024 0359   MCHC 30.8 06/20/2024 0359   RDW 18.2 (H) 06/20/2024 0359   LYMPHSABS 2.2 06/20/2024 0359   MONOABS 0.8 06/20/2024 0359   EOSABS 0.3 06/20/2024 0359   BASOSABS 0.0 06/20/2024 0359    BMET    Component Value Date/Time   NA 140 06/20/2024 0359   K 4.9 06/20/2024 0359   CL 102 06/20/2024 0359   CO2 32 06/20/2024 0359   GLUCOSE 183 (H) 06/20/2024 0359   BUN 8 06/20/2024 0359   CREATININE 0.67 06/20/2024 0359   CALCIUM  8.8 (L) 06/20/2024 0359   GFRNONAA >60 06/20/2024 0359    INR    Component Value Date/Time   INR 1.0 06/10/2024 1436     Intake/Output Summary (Last 24 hours) at 06/20/2024 0844 Last data filed at 06/20/2024 0501 Gross per 24 hour  Intake 647.8 ml  Output 400 ml  Net 247.8 ml     Assessment/Plan:  77 y.o. female is s/p Right common femoral and SFA endarterectomy with patch angioplasty on 06/16/2024 by Dr. Pearline for CLTI with rest pain  4 Days Post-Op   RLE remains well perfused and warm with doppler Dp/ PT signals Prevena in right groin was removed yesterday Right groin incision is c/d/I without swelling or hematoma Gave instructions on wound  care to patient and to keep dry gauze in groin to wick moisture O2 remaining > 90 on RA. No need for home O2 at this time Hemodynamically stable Afebrile. No leukocytosis. Cx negative AKI resolved [facility] PT/OT/ Aide orders placed and well be arranged on d/c Stable for discharge home today She will follow up in our office in 2-3 weeks for incision check   Teretha Damme, PA-C [facility] [phone] 06/20/2024 8:44 AM  VASCULAR STAFF ADDENDUM: I have independently interviewed and examined the patient. I agree with the above.  She was complaining of some itching in her face.  It does appear slightly more red than normal.  Will give a dose of Benadryl , possibly related to IV antibiotics which will now be stopped as all culture data was negative.   Will still plan for discharge today, follow-up will be arranged  Norman GORMAN Pearline MD [facility] Phone Number: [phone] 06/20/2024 10:46 AM

## 2024-06-20 NOTE — Progress Notes (Signed)
 Patient status improved after getting 1l of fluid bolus ,patient gets cleared for discharge from vascular and medical standpoint,home oxygen  delivered in the room,discharge education given to patient daughter she verbalizes understanding

## 2024-06-22 ENCOUNTER — Telehealth: Payer: Self-pay

## 2024-06-22 DIAGNOSIS — E1169 Type 2 diabetes mellitus with other specified complication: Secondary | ICD-10-CM | POA: Diagnosis not present

## 2024-06-22 DIAGNOSIS — Z87891 Personal history of nicotine dependence: Secondary | ICD-10-CM | POA: Diagnosis not present

## 2024-06-22 DIAGNOSIS — Z7984 Long term (current) use of oral hypoglycemic drugs: Secondary | ICD-10-CM | POA: Diagnosis not present

## 2024-06-22 DIAGNOSIS — E8809 Other disorders of plasma-protein metabolism, not elsewhere classified: Secondary | ICD-10-CM | POA: Diagnosis not present

## 2024-06-22 DIAGNOSIS — E1122 Type 2 diabetes mellitus with diabetic chronic kidney disease: Secondary | ICD-10-CM | POA: Diagnosis not present

## 2024-06-22 DIAGNOSIS — H34239 Retinal artery branch occlusion, unspecified eye: Secondary | ICD-10-CM | POA: Diagnosis not present

## 2024-06-22 DIAGNOSIS — Z79891 Long term (current) use of opiate analgesic: Secondary | ICD-10-CM | POA: Diagnosis not present

## 2024-06-22 DIAGNOSIS — J9601 Acute respiratory failure with hypoxia: Secondary | ICD-10-CM | POA: Diagnosis not present

## 2024-06-22 DIAGNOSIS — D631 Anemia in chronic kidney disease: Secondary | ICD-10-CM | POA: Diagnosis not present

## 2024-06-22 DIAGNOSIS — E1151 Type 2 diabetes mellitus with diabetic peripheral angiopathy without gangrene: Secondary | ICD-10-CM | POA: Diagnosis not present

## 2024-06-22 DIAGNOSIS — Z794 Long term (current) use of insulin: Secondary | ICD-10-CM | POA: Diagnosis not present

## 2024-06-22 DIAGNOSIS — E039 Hypothyroidism, unspecified: Secondary | ICD-10-CM | POA: Diagnosis not present

## 2024-06-22 DIAGNOSIS — N182 Chronic kidney disease, stage 2 (mild): Secondary | ICD-10-CM | POA: Diagnosis not present

## 2024-06-22 DIAGNOSIS — Z95820 Peripheral vascular angioplasty status with implants and grafts: Secondary | ICD-10-CM | POA: Diagnosis not present

## 2024-06-22 DIAGNOSIS — N179 Acute kidney failure, unspecified: Secondary | ICD-10-CM | POA: Diagnosis not present

## 2024-06-22 DIAGNOSIS — Z8673 Personal history of transient ischemic attack (TIA), and cerebral infarction without residual deficits: Secondary | ICD-10-CM | POA: Diagnosis not present

## 2024-06-22 DIAGNOSIS — E876 Hypokalemia: Secondary | ICD-10-CM | POA: Diagnosis not present

## 2024-06-22 DIAGNOSIS — Z604 Social exclusion and rejection: Secondary | ICD-10-CM | POA: Diagnosis not present

## 2024-06-22 DIAGNOSIS — Z48812 Encounter for surgical aftercare following surgery on the circulatory system: Secondary | ICD-10-CM | POA: Diagnosis not present

## 2024-06-22 DIAGNOSIS — E113413 Type 2 diabetes mellitus with severe nonproliferative diabetic retinopathy with macular edema, bilateral: Secondary | ICD-10-CM | POA: Diagnosis not present

## 2024-06-22 DIAGNOSIS — I1310 Hypertensive heart and chronic kidney disease without heart failure, with stage 1 through stage 4 chronic kidney disease, or unspecified chronic kidney disease: Secondary | ICD-10-CM | POA: Diagnosis not present

## 2024-06-22 DIAGNOSIS — Z7982 Long term (current) use of aspirin: Secondary | ICD-10-CM | POA: Diagnosis not present

## 2024-06-22 DIAGNOSIS — E782 Mixed hyperlipidemia: Secondary | ICD-10-CM | POA: Diagnosis not present

## 2024-06-22 NOTE — Transitions of Care (Post Inpatient/ED Visit) (Signed)
   06/22/2024  Name: Sara [name] MRN: [id number] DOB: 12-Feb-1946  Today's TOC FU Call Status: Today's TOC FU Call Status:: Successful TOC FU Call Completed TOC FU Call Complete Date: 06/22/24 Patient's Name and Date of Birth confirmed. Spoke with daughter (DPR) [name].  Patient is now seeing Mikel Fila at [facility] as PCP  Transition Care Management Follow-up Telephone Call Date of Discharge: 06/20/24 Discharge Facility: [name]) Type of Discharge: Inpatient Admission Primary Inpatient Discharge Diagnosis:: Peripheral vascular disease, unspecified   [name] DOROTHA Seeds RN, MSN [facility]  [facility], [facility] RN Care Manager Direct Dial: [phone]  Fax: [phone] Website: [email or web address]

## 2024-06-22 NOTE — Telephone Encounter (Signed)
 [place], PT from [facility] called for verbal orders.  Verbal orders given:  Home Health PT 1x/wk for 9 weeks.

## 2024-06-23 LAB — CULTURE, BLOOD (ROUTINE X 2)
Culture: NO GROWTH
Culture: NO GROWTH
Special Requests: ADEQUATE

## 2024-06-24 DIAGNOSIS — Z95820 Peripheral vascular angioplasty status with implants and grafts: Secondary | ICD-10-CM | POA: Diagnosis not present

## 2024-06-24 DIAGNOSIS — E1151 Type 2 diabetes mellitus with diabetic peripheral angiopathy without gangrene: Secondary | ICD-10-CM | POA: Diagnosis not present

## 2024-06-24 DIAGNOSIS — Z604 Social exclusion and rejection: Secondary | ICD-10-CM | POA: Diagnosis not present

## 2024-06-24 DIAGNOSIS — E782 Mixed hyperlipidemia: Secondary | ICD-10-CM | POA: Diagnosis not present

## 2024-06-24 DIAGNOSIS — E113413 Type 2 diabetes mellitus with severe nonproliferative diabetic retinopathy with macular edema, bilateral: Secondary | ICD-10-CM | POA: Diagnosis not present

## 2024-06-24 DIAGNOSIS — Z7982 Long term (current) use of aspirin: Secondary | ICD-10-CM | POA: Diagnosis not present

## 2024-06-24 DIAGNOSIS — E039 Hypothyroidism, unspecified: Secondary | ICD-10-CM | POA: Diagnosis not present

## 2024-06-24 DIAGNOSIS — E1122 Type 2 diabetes mellitus with diabetic chronic kidney disease: Secondary | ICD-10-CM | POA: Diagnosis not present

## 2024-06-24 DIAGNOSIS — H34239 Retinal artery branch occlusion, unspecified eye: Secondary | ICD-10-CM | POA: Diagnosis not present

## 2024-06-24 DIAGNOSIS — E1169 Type 2 diabetes mellitus with other specified complication: Secondary | ICD-10-CM | POA: Diagnosis not present

## 2024-06-24 DIAGNOSIS — Z7984 Long term (current) use of oral hypoglycemic drugs: Secondary | ICD-10-CM | POA: Diagnosis not present

## 2024-06-24 DIAGNOSIS — J9601 Acute respiratory failure with hypoxia: Secondary | ICD-10-CM | POA: Diagnosis not present

## 2024-06-24 DIAGNOSIS — E876 Hypokalemia: Secondary | ICD-10-CM | POA: Diagnosis not present

## 2024-06-24 DIAGNOSIS — Z79891 Long term (current) use of opiate analgesic: Secondary | ICD-10-CM | POA: Diagnosis not present

## 2024-06-24 DIAGNOSIS — Z48812 Encounter for surgical aftercare following surgery on the circulatory system: Secondary | ICD-10-CM | POA: Diagnosis not present

## 2024-06-24 DIAGNOSIS — Z87891 Personal history of nicotine dependence: Secondary | ICD-10-CM | POA: Diagnosis not present

## 2024-06-24 DIAGNOSIS — N179 Acute kidney failure, unspecified: Secondary | ICD-10-CM | POA: Diagnosis not present

## 2024-06-24 DIAGNOSIS — D631 Anemia in chronic kidney disease: Secondary | ICD-10-CM | POA: Diagnosis not present

## 2024-06-24 DIAGNOSIS — E8809 Other disorders of plasma-protein metabolism, not elsewhere classified: Secondary | ICD-10-CM | POA: Diagnosis not present

## 2024-06-24 DIAGNOSIS — I739 Peripheral vascular disease, unspecified: Secondary | ICD-10-CM | POA: Diagnosis not present

## 2024-06-24 DIAGNOSIS — R0902 Hypoxemia: Secondary | ICD-10-CM | POA: Diagnosis not present

## 2024-06-24 DIAGNOSIS — Z8673 Personal history of transient ischemic attack (TIA), and cerebral infarction without residual deficits: Secondary | ICD-10-CM | POA: Diagnosis not present

## 2024-06-24 DIAGNOSIS — Z794 Long term (current) use of insulin: Secondary | ICD-10-CM | POA: Diagnosis not present

## 2024-06-24 DIAGNOSIS — N182 Chronic kidney disease, stage 2 (mild): Secondary | ICD-10-CM | POA: Diagnosis not present

## 2024-06-24 DIAGNOSIS — I1310 Hypertensive heart and chronic kidney disease without heart failure, with stage 1 through stage 4 chronic kidney disease, or unspecified chronic kidney disease: Secondary | ICD-10-CM | POA: Diagnosis not present

## 2024-06-29 DIAGNOSIS — H34239 Retinal artery branch occlusion, unspecified eye: Secondary | ICD-10-CM | POA: Diagnosis not present

## 2024-06-29 DIAGNOSIS — I1310 Hypertensive heart and chronic kidney disease without heart failure, with stage 1 through stage 4 chronic kidney disease, or unspecified chronic kidney disease: Secondary | ICD-10-CM | POA: Diagnosis not present

## 2024-06-29 DIAGNOSIS — Z604 Social exclusion and rejection: Secondary | ICD-10-CM | POA: Diagnosis not present

## 2024-07-06 DIAGNOSIS — E8809 Other disorders of plasma-protein metabolism, not elsewhere classified: Secondary | ICD-10-CM | POA: Diagnosis not present

## 2024-07-06 DIAGNOSIS — N182 Chronic kidney disease, stage 2 (mild): Secondary | ICD-10-CM | POA: Diagnosis not present

## 2024-07-06 DIAGNOSIS — E782 Mixed hyperlipidemia: Secondary | ICD-10-CM | POA: Diagnosis not present

## 2024-07-06 DIAGNOSIS — Z95820 Peripheral vascular angioplasty status with implants and grafts: Secondary | ICD-10-CM | POA: Diagnosis not present

## 2024-07-06 DIAGNOSIS — I1310 Hypertensive heart and chronic kidney disease without heart failure, with stage 1 through stage 4 chronic kidney disease, or unspecified chronic kidney disease: Secondary | ICD-10-CM | POA: Diagnosis not present

## 2024-07-06 DIAGNOSIS — Z7984 Long term (current) use of oral hypoglycemic drugs: Secondary | ICD-10-CM | POA: Diagnosis not present

## 2024-07-06 DIAGNOSIS — Z79891 Long term (current) use of opiate analgesic: Secondary | ICD-10-CM | POA: Diagnosis not present

## 2024-07-06 DIAGNOSIS — Z604 Social exclusion and rejection: Secondary | ICD-10-CM | POA: Diagnosis not present

## 2024-07-06 DIAGNOSIS — N179 Acute kidney failure, unspecified: Secondary | ICD-10-CM | POA: Diagnosis not present

## 2024-07-06 DIAGNOSIS — Z48812 Encounter for surgical aftercare following surgery on the circulatory system: Secondary | ICD-10-CM | POA: Diagnosis not present

## 2024-07-06 DIAGNOSIS — Z87891 Personal history of nicotine dependence: Secondary | ICD-10-CM | POA: Diagnosis not present

## 2024-07-06 DIAGNOSIS — Z8673 Personal history of transient ischemic attack (TIA), and cerebral infarction without residual deficits: Secondary | ICD-10-CM | POA: Diagnosis not present

## 2024-07-06 DIAGNOSIS — Z794 Long term (current) use of insulin: Secondary | ICD-10-CM | POA: Diagnosis not present

## 2024-07-06 DIAGNOSIS — E1169 Type 2 diabetes mellitus with other specified complication: Secondary | ICD-10-CM | POA: Diagnosis not present

## 2024-07-06 DIAGNOSIS — J9601 Acute respiratory failure with hypoxia: Secondary | ICD-10-CM | POA: Diagnosis not present

## 2024-07-06 DIAGNOSIS — Z7982 Long term (current) use of aspirin: Secondary | ICD-10-CM | POA: Diagnosis not present

## 2024-07-06 DIAGNOSIS — E876 Hypokalemia: Secondary | ICD-10-CM | POA: Diagnosis not present

## 2024-07-06 DIAGNOSIS — H34239 Retinal artery branch occlusion, unspecified eye: Secondary | ICD-10-CM | POA: Diagnosis not present

## 2024-07-06 DIAGNOSIS — E1151 Type 2 diabetes mellitus with diabetic peripheral angiopathy without gangrene: Secondary | ICD-10-CM | POA: Diagnosis not present

## 2024-07-06 DIAGNOSIS — E039 Hypothyroidism, unspecified: Secondary | ICD-10-CM | POA: Diagnosis not present

## 2024-07-06 DIAGNOSIS — E113413 Type 2 diabetes mellitus with severe nonproliferative diabetic retinopathy with macular edema, bilateral: Secondary | ICD-10-CM | POA: Diagnosis not present

## 2024-07-06 DIAGNOSIS — E1122 Type 2 diabetes mellitus with diabetic chronic kidney disease: Secondary | ICD-10-CM | POA: Diagnosis not present

## 2024-07-08 DIAGNOSIS — Z79891 Long term (current) use of opiate analgesic: Secondary | ICD-10-CM | POA: Diagnosis not present

## 2024-07-08 DIAGNOSIS — Z95820 Peripheral vascular angioplasty status with implants and grafts: Secondary | ICD-10-CM | POA: Diagnosis not present

## 2024-07-08 DIAGNOSIS — E1151 Type 2 diabetes mellitus with diabetic peripheral angiopathy without gangrene: Secondary | ICD-10-CM | POA: Diagnosis not present

## 2024-07-08 DIAGNOSIS — E113413 Type 2 diabetes mellitus with severe nonproliferative diabetic retinopathy with macular edema, bilateral: Secondary | ICD-10-CM | POA: Diagnosis not present

## 2024-07-08 DIAGNOSIS — J9601 Acute respiratory failure with hypoxia: Secondary | ICD-10-CM | POA: Diagnosis not present

## 2024-07-08 DIAGNOSIS — E782 Mixed hyperlipidemia: Secondary | ICD-10-CM | POA: Diagnosis not present

## 2024-07-08 DIAGNOSIS — Z604 Social exclusion and rejection: Secondary | ICD-10-CM | POA: Diagnosis not present

## 2024-07-08 DIAGNOSIS — H34239 Retinal artery branch occlusion, unspecified eye: Secondary | ICD-10-CM | POA: Diagnosis not present

## 2024-07-08 DIAGNOSIS — N182 Chronic kidney disease, stage 2 (mild): Secondary | ICD-10-CM | POA: Diagnosis not present

## 2024-07-08 DIAGNOSIS — Z8673 Personal history of transient ischemic attack (TIA), and cerebral infarction without residual deficits: Secondary | ICD-10-CM | POA: Diagnosis not present

## 2024-07-08 DIAGNOSIS — E039 Hypothyroidism, unspecified: Secondary | ICD-10-CM | POA: Diagnosis not present

## 2024-07-08 DIAGNOSIS — I1310 Hypertensive heart and chronic kidney disease without heart failure, with stage 1 through stage 4 chronic kidney disease, or unspecified chronic kidney disease: Secondary | ICD-10-CM | POA: Diagnosis not present

## 2024-07-08 DIAGNOSIS — E876 Hypokalemia: Secondary | ICD-10-CM | POA: Diagnosis not present

## 2024-07-08 DIAGNOSIS — Z794 Long term (current) use of insulin: Secondary | ICD-10-CM | POA: Diagnosis not present

## 2024-07-08 DIAGNOSIS — Z7982 Long term (current) use of aspirin: Secondary | ICD-10-CM | POA: Diagnosis not present

## 2024-07-08 DIAGNOSIS — Z7984 Long term (current) use of oral hypoglycemic drugs: Secondary | ICD-10-CM | POA: Diagnosis not present

## 2024-07-08 DIAGNOSIS — E1122 Type 2 diabetes mellitus with diabetic chronic kidney disease: Secondary | ICD-10-CM | POA: Diagnosis not present

## 2024-07-08 DIAGNOSIS — Z87891 Personal history of nicotine dependence: Secondary | ICD-10-CM | POA: Diagnosis not present

## 2024-07-08 DIAGNOSIS — E1169 Type 2 diabetes mellitus with other specified complication: Secondary | ICD-10-CM | POA: Diagnosis not present

## 2024-07-08 DIAGNOSIS — D631 Anemia in chronic kidney disease: Secondary | ICD-10-CM | POA: Diagnosis not present

## 2024-07-08 DIAGNOSIS — Z48812 Encounter for surgical aftercare following surgery on the circulatory system: Secondary | ICD-10-CM | POA: Diagnosis not present

## 2024-07-08 DIAGNOSIS — N179 Acute kidney failure, unspecified: Secondary | ICD-10-CM | POA: Diagnosis not present

## 2024-07-08 DIAGNOSIS — E8809 Other disorders of plasma-protein metabolism, not elsewhere classified: Secondary | ICD-10-CM | POA: Diagnosis not present

## 2024-07-08 NOTE — Progress Notes (Unsigned)
 Patient ID: Sara [name], female   DOB: 08/04/1946, 78 y.o.   MRN: [id number]  Reason for Consult: No chief complaint on file.   Referred by Verdia Lombard, MD  Subjective:     HPI Sara [name] is a 78 y.o. female presenting for follow-up of PAD.  She underwent right common femoral and SFA endarterectomy with patch angioplasty on 06/16/2024. 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   DM (diabetes mellitus) (HCC)    DOE (dyspnea on exertion) 05/24/2022   Essential hypertension 04/25/2022   Hyperglycemia due to type 2 diabetes mellitus (HCC) 05/24/2022   Hypertension with renal disease    Hypothyroidism    Long term (current) use of insulin  (HCC)    Menopause 05/24/2022   Mixed hyperlipidemia    Morbid obesity due to excess calories (HCC)    Obesity (BMI 35.0-39.9 without comorbidity) 05/24/2022   Postmenopausal bleeding 05/30/2022   PVD (peripheral vascular disease) (HCC)    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) 05/24/2022   Stroke (HCC) 05/24/2022   Transient arterial retinal occlusion 05/24/2022   Type 2 diabetes mellitus with hyperglycemia (HCC)    Type 2 diabetes mellitus with other specified complication (HCC) 05/24/2022   Type 2 diabetes mellitus with peripheral angiopathy (HCC) 05/24/2022   Family History  Problem Relation Age of Onset   Heart disease Mother    Diabetes Brother    Diabetes Maternal Uncle    Colon cancer Neg Hx    Esophageal cancer Neg Hx    Stomach cancer  Neg Hx    Rectal cancer Neg Hx    Past Surgical History:  Procedure Laterality Date   APPENDECTOMY     as a teenager   CATARACT EXTRACTION W/ INTRAOCULAR LENS IMPLANT Bilateral    CESAREAN SECTION  1976   CESAREAN SECTION  1979   COLONOSCOPY     ENDARTERECTOMY FEMORAL Right 06/16/2024   Procedure: RIGHT FEMORAL ENDARTERECTOMY;  Surgeon: Pearline Norman RAMAN, MD;  Location: [facility] OR;  Service: Vascular;  Laterality: Right;   PATCH ANGIOPLASTY Right 06/16/2024   Procedure: ANGIOPLASTY, USING GEORGE BIOLOGIC PATCH;  Surgeon: Pearline Norman RAMAN, MD;  Location: [facility] OR;  Service: Vascular;  Laterality: Right;   polypectomy  2023   Uterine   TUBAL LIGATION  1979   with C-Section    Short Social History:  Social History   Tobacco Use   Smoking status: Former    Current packs/day: 0.00    Types: Cigarettes    Quit date: 10/01/2004    Years since quitting: 19.7   Smokeless tobacco: Never  Substance Use Topics   Alcohol use: Yes    Comment: occasional    Allergies  Allergen Reactions   Other     Eggplant causes extreme fatigue    Current Outpatient Medications  Medication Sig Dispense Refill   aspirin  EC 81 MG tablet Take 81 mg by mouth daily. Swallow whole.     atorvastatin  (LIPITOR) 40 MG tablet Take 40 mg by mouth  daily.     EUTHYROX  50 MCG tablet Take 50 mcg by mouth daily before breakfast.     insulin  lispro protamine -lispro (HUMALOG  MIX 75/25) (75-25) 100 UNIT/ML SUSP injection Inject 0.15 mLs (15 Units total) into the skin daily with breakfast. 10 mL 11   insulin  NPH-regular Human (HUMULIN  70/30) (70-30) 100 UNIT/ML injection Inject 30 units into the skin 2(two) times daily with a meal. 30 units in the morning and 50 units at night 10 mL 11   losartan -hydrochlorothiazide  (HYZAAR) 50-12.5 MG tablet Take 2 tablets by mouth daily.     meclizine  (ANTIVERT ) 12.5 MG tablet Take 1 tablet (12.5 mg total) by mouth 3 (three) times daily as needed for dizziness. 30 tablet 0   metFORMIN   (GLUCOPHAGE ) 1000 MG tablet Take 1,000 mg by mouth 2 (two) times daily with a meal.     oxyCODONE -acetaminophen  (PERCOCET/ROXICET) 5-325 MG tablet Take 1 tablet by mouth every 6 (six) hours as needed for severe pain (pain score 7-10). 20 tablet 0   No current facility-administered medications for this visit.    REVIEW OF SYSTEMS  All other systems were reviewed and are negative     Objective:  Objective   There were no vitals filed for this visit. There is no height or weight on file to calculate BMI.  Physical Exam General: no acute distress Cardiac: hemodynamically stable Pulm: normal work of breathing Abdomen: Appropriately healing right groin incision Neuro: alert, no focal deficit Extremities: no edema, or cyanosis Vascular:   Right: ***  Left: ***     Assessment/Plan:   Sara [name] is a 78 y.o. female with PAD who is presenting for follow-up.  She underwent right common femoral and SFA endarterectomy with patch angioplasty on 06/16/2024. ***   Norman GORMAN Serve MD [facility]

## 2024-07-10 ENCOUNTER — Ambulatory Visit: Attending: Vascular Surgery | Admitting: Vascular Surgery

## 2024-07-10 VITALS — BP 147/73 | HR 71 | Temp 97.7°F

## 2024-07-10 DIAGNOSIS — I70221 Atherosclerosis of native arteries of extremities with rest pain, right leg: Secondary | ICD-10-CM

## 2024-07-14 DIAGNOSIS — E039 Hypothyroidism, unspecified: Secondary | ICD-10-CM | POA: Diagnosis not present

## 2024-07-16 ENCOUNTER — Other Ambulatory Visit: Payer: Self-pay | Admitting: *Deleted

## 2024-07-16 DIAGNOSIS — I739 Peripheral vascular disease, unspecified: Secondary | ICD-10-CM

## 2024-07-16 DIAGNOSIS — I70221 Atherosclerosis of native arteries of extremities with rest pain, right leg: Secondary | ICD-10-CM

## 2024-07-17 DIAGNOSIS — E876 Hypokalemia: Secondary | ICD-10-CM | POA: Diagnosis not present

## 2024-07-17 DIAGNOSIS — N179 Acute kidney failure, unspecified: Secondary | ICD-10-CM | POA: Diagnosis not present

## 2024-07-17 DIAGNOSIS — E8809 Other disorders of plasma-protein metabolism, not elsewhere classified: Secondary | ICD-10-CM | POA: Diagnosis not present

## 2024-07-17 DIAGNOSIS — E1151 Type 2 diabetes mellitus with diabetic peripheral angiopathy without gangrene: Secondary | ICD-10-CM | POA: Diagnosis not present

## 2024-07-17 DIAGNOSIS — E113413 Type 2 diabetes mellitus with severe nonproliferative diabetic retinopathy with macular edema, bilateral: Secondary | ICD-10-CM | POA: Diagnosis not present

## 2024-07-17 DIAGNOSIS — Z48812 Encounter for surgical aftercare following surgery on the circulatory system: Secondary | ICD-10-CM | POA: Diagnosis not present

## 2024-07-17 DIAGNOSIS — N182 Chronic kidney disease, stage 2 (mild): Secondary | ICD-10-CM | POA: Diagnosis not present

## 2024-07-17 DIAGNOSIS — Z95820 Peripheral vascular angioplasty status with implants and grafts: Secondary | ICD-10-CM | POA: Diagnosis not present

## 2024-07-17 DIAGNOSIS — J9601 Acute respiratory failure with hypoxia: Secondary | ICD-10-CM | POA: Diagnosis not present

## 2024-07-17 DIAGNOSIS — E039 Hypothyroidism, unspecified: Secondary | ICD-10-CM | POA: Diagnosis not present

## 2024-07-17 DIAGNOSIS — D631 Anemia in chronic kidney disease: Secondary | ICD-10-CM | POA: Diagnosis not present

## 2024-07-17 DIAGNOSIS — H34239 Retinal artery branch occlusion, unspecified eye: Secondary | ICD-10-CM | POA: Diagnosis not present

## 2024-07-17 DIAGNOSIS — Z7984 Long term (current) use of oral hypoglycemic drugs: Secondary | ICD-10-CM | POA: Diagnosis not present

## 2024-07-17 DIAGNOSIS — E1122 Type 2 diabetes mellitus with diabetic chronic kidney disease: Secondary | ICD-10-CM | POA: Diagnosis not present

## 2024-07-17 DIAGNOSIS — Z79891 Long term (current) use of opiate analgesic: Secondary | ICD-10-CM | POA: Diagnosis not present

## 2024-07-17 DIAGNOSIS — I1310 Hypertensive heart and chronic kidney disease without heart failure, with stage 1 through stage 4 chronic kidney disease, or unspecified chronic kidney disease: Secondary | ICD-10-CM | POA: Diagnosis not present

## 2024-07-17 DIAGNOSIS — E782 Mixed hyperlipidemia: Secondary | ICD-10-CM | POA: Diagnosis not present

## 2024-07-17 DIAGNOSIS — Z87891 Personal history of nicotine dependence: Secondary | ICD-10-CM | POA: Diagnosis not present

## 2024-07-17 DIAGNOSIS — E1169 Type 2 diabetes mellitus with other specified complication: Secondary | ICD-10-CM | POA: Diagnosis not present

## 2024-07-17 DIAGNOSIS — Z604 Social exclusion and rejection: Secondary | ICD-10-CM | POA: Diagnosis not present

## 2024-07-17 DIAGNOSIS — Z7982 Long term (current) use of aspirin: Secondary | ICD-10-CM | POA: Diagnosis not present

## 2024-07-17 DIAGNOSIS — Z8673 Personal history of transient ischemic attack (TIA), and cerebral infarction without residual deficits: Secondary | ICD-10-CM | POA: Diagnosis not present

## 2024-07-17 DIAGNOSIS — Z794 Long term (current) use of insulin: Secondary | ICD-10-CM | POA: Diagnosis not present

## 2024-07-20 DIAGNOSIS — E039 Hypothyroidism, unspecified: Secondary | ICD-10-CM | POA: Diagnosis not present

## 2024-07-21 DIAGNOSIS — E876 Hypokalemia: Secondary | ICD-10-CM | POA: Diagnosis not present

## 2024-07-21 DIAGNOSIS — E1151 Type 2 diabetes mellitus with diabetic peripheral angiopathy without gangrene: Secondary | ICD-10-CM | POA: Diagnosis not present

## 2024-07-21 DIAGNOSIS — E782 Mixed hyperlipidemia: Secondary | ICD-10-CM | POA: Diagnosis not present

## 2024-07-21 DIAGNOSIS — E039 Hypothyroidism, unspecified: Secondary | ICD-10-CM | POA: Diagnosis not present

## 2024-07-21 DIAGNOSIS — Z95820 Peripheral vascular angioplasty status with implants and grafts: Secondary | ICD-10-CM | POA: Diagnosis not present

## 2024-07-21 DIAGNOSIS — Z7984 Long term (current) use of oral hypoglycemic drugs: Secondary | ICD-10-CM | POA: Diagnosis not present

## 2024-07-21 DIAGNOSIS — I1310 Hypertensive heart and chronic kidney disease without heart failure, with stage 1 through stage 4 chronic kidney disease, or unspecified chronic kidney disease: Secondary | ICD-10-CM | POA: Diagnosis not present

## 2024-07-21 DIAGNOSIS — Z794 Long term (current) use of insulin: Secondary | ICD-10-CM | POA: Diagnosis not present

## 2024-07-21 DIAGNOSIS — Z7982 Long term (current) use of aspirin: Secondary | ICD-10-CM | POA: Diagnosis not present

## 2024-07-21 DIAGNOSIS — E113413 Type 2 diabetes mellitus with severe nonproliferative diabetic retinopathy with macular edema, bilateral: Secondary | ICD-10-CM | POA: Diagnosis not present

## 2024-07-21 DIAGNOSIS — E1122 Type 2 diabetes mellitus with diabetic chronic kidney disease: Secondary | ICD-10-CM | POA: Diagnosis not present

## 2024-07-21 DIAGNOSIS — M79672 Pain in left foot: Secondary | ICD-10-CM | POA: Diagnosis not present

## 2024-07-21 DIAGNOSIS — N182 Chronic kidney disease, stage 2 (mild): Secondary | ICD-10-CM | POA: Diagnosis not present

## 2024-07-21 DIAGNOSIS — Z79891 Long term (current) use of opiate analgesic: Secondary | ICD-10-CM | POA: Diagnosis not present

## 2024-07-21 DIAGNOSIS — E1169 Type 2 diabetes mellitus with other specified complication: Secondary | ICD-10-CM | POA: Diagnosis not present

## 2024-07-21 DIAGNOSIS — Z48812 Encounter for surgical aftercare following surgery on the circulatory system: Secondary | ICD-10-CM | POA: Diagnosis not present

## 2024-07-21 DIAGNOSIS — D631 Anemia in chronic kidney disease: Secondary | ICD-10-CM | POA: Diagnosis not present

## 2024-07-21 DIAGNOSIS — Z87891 Personal history of nicotine dependence: Secondary | ICD-10-CM | POA: Diagnosis not present

## 2024-07-21 DIAGNOSIS — E8809 Other disorders of plasma-protein metabolism, not elsewhere classified: Secondary | ICD-10-CM | POA: Diagnosis not present

## 2024-07-21 DIAGNOSIS — N179 Acute kidney failure, unspecified: Secondary | ICD-10-CM | POA: Diagnosis not present

## 2024-07-21 DIAGNOSIS — Z8673 Personal history of transient ischemic attack (TIA), and cerebral infarction without residual deficits: Secondary | ICD-10-CM | POA: Diagnosis not present

## 2024-07-21 DIAGNOSIS — H34239 Retinal artery branch occlusion, unspecified eye: Secondary | ICD-10-CM | POA: Diagnosis not present

## 2024-07-21 DIAGNOSIS — J9601 Acute respiratory failure with hypoxia: Secondary | ICD-10-CM | POA: Diagnosis not present

## 2024-07-21 DIAGNOSIS — Z604 Social exclusion and rejection: Secondary | ICD-10-CM | POA: Diagnosis not present

## 2024-07-22 ENCOUNTER — Ambulatory Visit (INDEPENDENT_AMBULATORY_CARE_PROVIDER_SITE_OTHER)
Admission: RE | Admit: 2024-07-22 | Discharge: 2024-07-22 | Disposition: A | Discharge status: Home / Self Care | Source: Ambulatory Visit | Attending: Family Medicine | Admitting: Family Medicine

## 2024-07-22 ENCOUNTER — Other Ambulatory Visit (HOSPITAL_BASED_OUTPATIENT_CLINIC_OR_DEPARTMENT_OTHER): Payer: Self-pay | Admitting: Family Medicine

## 2024-07-22 DIAGNOSIS — S92512A Displaced fracture of proximal phalanx of left lesser toe(s), initial encounter for closed fracture: Secondary | ICD-10-CM | POA: Diagnosis not present

## 2024-07-22 DIAGNOSIS — M79672 Pain in left foot: Secondary | ICD-10-CM | POA: Diagnosis not present

## 2024-07-28 DIAGNOSIS — E782 Mixed hyperlipidemia: Secondary | ICD-10-CM | POA: Diagnosis not present

## 2024-07-28 DIAGNOSIS — Z8673 Personal history of transient ischemic attack (TIA), and cerebral infarction without residual deficits: Secondary | ICD-10-CM | POA: Diagnosis not present

## 2024-07-28 DIAGNOSIS — E876 Hypokalemia: Secondary | ICD-10-CM | POA: Diagnosis not present

## 2024-07-28 DIAGNOSIS — E8809 Other disorders of plasma-protein metabolism, not elsewhere classified: Secondary | ICD-10-CM | POA: Diagnosis not present

## 2024-07-28 DIAGNOSIS — Z7984 Long term (current) use of oral hypoglycemic drugs: Secondary | ICD-10-CM | POA: Diagnosis not present

## 2024-07-28 DIAGNOSIS — H34239 Retinal artery branch occlusion, unspecified eye: Secondary | ICD-10-CM | POA: Diagnosis not present

## 2024-07-28 DIAGNOSIS — E1169 Type 2 diabetes mellitus with other specified complication: Secondary | ICD-10-CM | POA: Diagnosis not present

## 2024-07-28 DIAGNOSIS — Z87891 Personal history of nicotine dependence: Secondary | ICD-10-CM | POA: Diagnosis not present

## 2024-07-28 DIAGNOSIS — E1122 Type 2 diabetes mellitus with diabetic chronic kidney disease: Secondary | ICD-10-CM | POA: Diagnosis not present

## 2024-07-28 DIAGNOSIS — E113413 Type 2 diabetes mellitus with severe nonproliferative diabetic retinopathy with macular edema, bilateral: Secondary | ICD-10-CM | POA: Diagnosis not present

## 2024-07-28 DIAGNOSIS — Z79891 Long term (current) use of opiate analgesic: Secondary | ICD-10-CM | POA: Diagnosis not present

## 2024-07-28 DIAGNOSIS — Z95820 Peripheral vascular angioplasty status with implants and grafts: Secondary | ICD-10-CM | POA: Diagnosis not present

## 2024-07-28 DIAGNOSIS — E1151 Type 2 diabetes mellitus with diabetic peripheral angiopathy without gangrene: Secondary | ICD-10-CM | POA: Diagnosis not present

## 2024-07-28 DIAGNOSIS — D631 Anemia in chronic kidney disease: Secondary | ICD-10-CM | POA: Diagnosis not present

## 2024-07-28 DIAGNOSIS — Z604 Social exclusion and rejection: Secondary | ICD-10-CM | POA: Diagnosis not present

## 2024-07-28 DIAGNOSIS — I1310 Hypertensive heart and chronic kidney disease without heart failure, with stage 1 through stage 4 chronic kidney disease, or unspecified chronic kidney disease: Secondary | ICD-10-CM | POA: Diagnosis not present

## 2024-07-28 DIAGNOSIS — N182 Chronic kidney disease, stage 2 (mild): Secondary | ICD-10-CM | POA: Diagnosis not present

## 2024-07-28 DIAGNOSIS — Z48812 Encounter for surgical aftercare following surgery on the circulatory system: Secondary | ICD-10-CM | POA: Diagnosis not present

## 2024-07-28 DIAGNOSIS — N179 Acute kidney failure, unspecified: Secondary | ICD-10-CM | POA: Diagnosis not present

## 2024-07-28 DIAGNOSIS — E039 Hypothyroidism, unspecified: Secondary | ICD-10-CM | POA: Diagnosis not present

## 2024-07-28 DIAGNOSIS — Z794 Long term (current) use of insulin: Secondary | ICD-10-CM | POA: Diagnosis not present

## 2024-07-28 DIAGNOSIS — J9601 Acute respiratory failure with hypoxia: Secondary | ICD-10-CM | POA: Diagnosis not present

## 2024-07-28 DIAGNOSIS — Z7982 Long term (current) use of aspirin: Secondary | ICD-10-CM | POA: Diagnosis not present

## 2024-07-30 ENCOUNTER — Ambulatory Visit (INDEPENDENT_AMBULATORY_CARE_PROVIDER_SITE_OTHER)

## 2024-07-30 ENCOUNTER — Ambulatory Visit: Admitting: Podiatry

## 2024-07-30 DIAGNOSIS — Z8673 Personal history of transient ischemic attack (TIA), and cerebral infarction without residual deficits: Secondary | ICD-10-CM | POA: Diagnosis not present

## 2024-07-30 DIAGNOSIS — Z87891 Personal history of nicotine dependence: Secondary | ICD-10-CM | POA: Diagnosis not present

## 2024-07-30 DIAGNOSIS — Z7982 Long term (current) use of aspirin: Secondary | ICD-10-CM | POA: Diagnosis not present

## 2024-07-30 DIAGNOSIS — N182 Chronic kidney disease, stage 2 (mild): Secondary | ICD-10-CM | POA: Diagnosis not present

## 2024-07-30 DIAGNOSIS — Z95820 Peripheral vascular angioplasty status with implants and grafts: Secondary | ICD-10-CM | POA: Diagnosis not present

## 2024-07-30 DIAGNOSIS — E113413 Type 2 diabetes mellitus with severe nonproliferative diabetic retinopathy with macular edema, bilateral: Secondary | ICD-10-CM | POA: Diagnosis not present

## 2024-07-30 DIAGNOSIS — D631 Anemia in chronic kidney disease: Secondary | ICD-10-CM | POA: Diagnosis not present

## 2024-07-30 DIAGNOSIS — S92912A Unspecified fracture of left toe(s), initial encounter for closed fracture: Secondary | ICD-10-CM

## 2024-07-30 DIAGNOSIS — S92502A Displaced unspecified fracture of left lesser toe(s), initial encounter for closed fracture: Secondary | ICD-10-CM

## 2024-07-30 DIAGNOSIS — Z7984 Long term (current) use of oral hypoglycemic drugs: Secondary | ICD-10-CM | POA: Diagnosis not present

## 2024-07-30 DIAGNOSIS — Z794 Long term (current) use of insulin: Secondary | ICD-10-CM | POA: Diagnosis not present

## 2024-07-30 DIAGNOSIS — E1151 Type 2 diabetes mellitus with diabetic peripheral angiopathy without gangrene: Secondary | ICD-10-CM | POA: Diagnosis not present

## 2024-07-30 DIAGNOSIS — J9601 Acute respiratory failure with hypoxia: Secondary | ICD-10-CM | POA: Diagnosis not present

## 2024-07-30 DIAGNOSIS — Z48812 Encounter for surgical aftercare following surgery on the circulatory system: Secondary | ICD-10-CM | POA: Diagnosis not present

## 2024-07-30 DIAGNOSIS — N179 Acute kidney failure, unspecified: Secondary | ICD-10-CM | POA: Diagnosis not present

## 2024-07-30 DIAGNOSIS — S92503A Displaced unspecified fracture of unspecified lesser toe(s), initial encounter for closed fracture: Secondary | ICD-10-CM | POA: Diagnosis not present

## 2024-07-30 DIAGNOSIS — Z604 Social exclusion and rejection: Secondary | ICD-10-CM | POA: Diagnosis not present

## 2024-07-30 DIAGNOSIS — H34239 Retinal artery branch occlusion, unspecified eye: Secondary | ICD-10-CM | POA: Diagnosis not present

## 2024-07-30 DIAGNOSIS — E039 Hypothyroidism, unspecified: Secondary | ICD-10-CM | POA: Diagnosis not present

## 2024-07-30 DIAGNOSIS — E782 Mixed hyperlipidemia: Secondary | ICD-10-CM | POA: Diagnosis not present

## 2024-07-30 DIAGNOSIS — E1169 Type 2 diabetes mellitus with other specified complication: Secondary | ICD-10-CM | POA: Diagnosis not present

## 2024-07-30 DIAGNOSIS — E1122 Type 2 diabetes mellitus with diabetic chronic kidney disease: Secondary | ICD-10-CM | POA: Diagnosis not present

## 2024-07-30 DIAGNOSIS — I1310 Hypertensive heart and chronic kidney disease without heart failure, with stage 1 through stage 4 chronic kidney disease, or unspecified chronic kidney disease: Secondary | ICD-10-CM | POA: Diagnosis not present

## 2024-07-30 DIAGNOSIS — Z79891 Long term (current) use of opiate analgesic: Secondary | ICD-10-CM | POA: Diagnosis not present

## 2024-07-30 DIAGNOSIS — E876 Hypokalemia: Secondary | ICD-10-CM | POA: Diagnosis not present

## 2024-07-30 DIAGNOSIS — E8809 Other disorders of plasma-protein metabolism, not elsewhere classified: Secondary | ICD-10-CM | POA: Diagnosis not present

## 2024-07-30 NOTE — Progress Notes (Signed)
 Chief Complaint  Patient presents with   Fracture    Left foot toes, 2 and 3, husband fell on her 2 weeks ago. Toes are bruised and Dr. Melonie referred her here, he confirmed FX. Was wearing slide on shoes today A1c 8 in Aug ASA,    HPI: 78 y.o. female presents today with concern of fracture to the left 4th and 5th toes.  She was seen by an external provider approximately 1 week ago.  She states that the injury occurred 3 to 4 days prior to her visit with the external provider which would be approximately 07/18/2024.  States that she was at home and her husband fell and landed on her left foot.  She had immediate pain.  Notes that it did bruise but she did not have any open cuts to the skin.  She was evaluated and confirmed to have the toe fractures to the 4th and 5th toes but no treatment was rendered.  She notes that the area is still very sore when she walks.  She is wearing regular shoes today.  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/2022   DM (diabetes mellitus) (HCC)    DOE (dyspnea on exertion) 05/24/2022   Essential hypertension 04/25/2022   Hyperglycemia due to type 2 diabetes mellitus (HCC) 05/24/2022   Hypertension with renal disease    Hypothyroidism    Long term (current) use of insulin  (HCC)    Menopause 05/24/2022   Mixed hyperlipidemia    Morbid obesity due to excess calories (HCC)    Obesity (BMI 35.0-39.9 without comorbidity) 05/24/2022   Postmenopausal bleeding 05/30/2022   PVD (peripheral vascular disease) (HCC)    Severe nonproliferative diabetic retinopathy of both eyes with macular edema  associated with type 2 diabetes mellitus (HCC) 05/24/2022   Stroke (HCC) 05/24/2022   Transient arterial retinal occlusion 05/24/2022   Type 2 diabetes mellitus with hyperglycemia (HCC)    Type 2 diabetes mellitus with other specified complication (HCC) 05/24/2022   Type 2 diabetes mellitus with peripheral angiopathy (HCC) 05/24/2022   Past Surgical History:  Procedure Laterality Date   APPENDECTOMY     as a teenager   CATARACT EXTRACTION W/ INTRAOCULAR LENS IMPLANT Bilateral    CESAREAN SECTION  1976   CESAREAN SECTION  1979   COLONOSCOPY     ENDARTERECTOMY FEMORAL Right 06/16/2024   Procedure: RIGHT FEMORAL ENDARTERECTOMY;  Surgeon: Pearline Norman RAMAN, MD;  Location: [facility] OR;  Service: Vascular;  Laterality: Right;   PATCH ANGIOPLASTY Right 06/16/2024   Procedure: ANGIOPLASTY, USING GEORGE BIOLOGIC PATCH;  Surgeon: Pearline Norman RAMAN, MD;  Location: [facility] OR;  Service: Vascular;  Laterality: Right;   polypectomy  2023   Uterine   TUBAL LIGATION  1979   with C-Section   Allergies  Allergen Reactions   Other     Eggplant causes extreme fatigue   Physical Exam: 1/4 pedal pulses left foot.  There is localized edema with mild ecchymosis to the left dorsal lateral forefoot.  There is pain on palpation from the midshaft of the metatarsals down to the left 4th and 5th toes.  Patient unable to resist dorsiflexion of the toes without pain.  Gentle range of motion reproduces pain.  The toes are in good position without displacement.  Epicritic sensation intact.  No open lesions are noted.  Radiographic Exam (left foot, 3 weightbearing views, 07/30/2024):  Normal osseous mineralization.  Patient has a horizontal fracture in the base of the left fifth proximal phalanx.  This is nondisplaced and in good position.  She also has an avulsion fracture on the proximal-medial portion of the fourth proximal phalanx.  This is intra-articular.  Minimal displacement.  Assessment/Plan of Care: 1. Fracture of phalanx of  left fifth toe   2. Closed nondisplaced fracture of phalanx of toe of left foot, unspecified toe, initial encounter   3. Fracture of phalanx of fourth toe     SURGICAL BOOT  Discussed radiographic findings with the patient today.  She was shown where the fracture lines were on x-ray.  She does need to be immobilized in a surgical shoe so that the toes cannot bend to allow them to heal.  The flexor tendons insert into this area on the plantar aspect of the proximal phalanx bases so any toe movement can slow or restrict healing.  She expressed understanding.  It should be noted that an interpreter was present for the exam.  The medical assistant fitted the patient for a size medium Darco surgical shoe and the proof of delivery form along with insurance waiver was signed via motion MD by the patient.  Will have her follow-up in 3 to 4 weeks for x-ray.  Patient was informed it may take 4 to 6 weeks for the fractures to heal.   Tiffany Talarico D. Eriana Suliman, DPM, FACFAS [facility]     [address] [place], [place] [address]                Office [phone]  Fax [phone]

## 2024-08-04 DIAGNOSIS — Z7982 Long term (current) use of aspirin: Secondary | ICD-10-CM | POA: Diagnosis not present

## 2024-08-04 DIAGNOSIS — E8809 Other disorders of plasma-protein metabolism, not elsewhere classified: Secondary | ICD-10-CM | POA: Diagnosis not present

## 2024-08-04 DIAGNOSIS — H34239 Retinal artery branch occlusion, unspecified eye: Secondary | ICD-10-CM | POA: Diagnosis not present

## 2024-08-04 DIAGNOSIS — Z79891 Long term (current) use of opiate analgesic: Secondary | ICD-10-CM | POA: Diagnosis not present

## 2024-08-04 DIAGNOSIS — Z48812 Encounter for surgical aftercare following surgery on the circulatory system: Secondary | ICD-10-CM | POA: Diagnosis not present

## 2024-08-04 DIAGNOSIS — E113413 Type 2 diabetes mellitus with severe nonproliferative diabetic retinopathy with macular edema, bilateral: Secondary | ICD-10-CM | POA: Diagnosis not present

## 2024-08-04 DIAGNOSIS — E039 Hypothyroidism, unspecified: Secondary | ICD-10-CM | POA: Diagnosis not present

## 2024-08-04 DIAGNOSIS — Z87891 Personal history of nicotine dependence: Secondary | ICD-10-CM | POA: Diagnosis not present

## 2024-08-04 DIAGNOSIS — E1169 Type 2 diabetes mellitus with other specified complication: Secondary | ICD-10-CM | POA: Diagnosis not present

## 2024-08-04 DIAGNOSIS — E1151 Type 2 diabetes mellitus with diabetic peripheral angiopathy without gangrene: Secondary | ICD-10-CM | POA: Diagnosis not present

## 2024-08-04 DIAGNOSIS — N179 Acute kidney failure, unspecified: Secondary | ICD-10-CM | POA: Diagnosis not present

## 2024-08-04 DIAGNOSIS — Z794 Long term (current) use of insulin: Secondary | ICD-10-CM | POA: Diagnosis not present

## 2024-08-04 DIAGNOSIS — E782 Mixed hyperlipidemia: Secondary | ICD-10-CM | POA: Diagnosis not present

## 2024-08-04 DIAGNOSIS — E876 Hypokalemia: Secondary | ICD-10-CM | POA: Diagnosis not present

## 2024-08-04 DIAGNOSIS — E1122 Type 2 diabetes mellitus with diabetic chronic kidney disease: Secondary | ICD-10-CM | POA: Diagnosis not present

## 2024-08-04 DIAGNOSIS — I1310 Hypertensive heart and chronic kidney disease without heart failure, with stage 1 through stage 4 chronic kidney disease, or unspecified chronic kidney disease: Secondary | ICD-10-CM | POA: Diagnosis not present

## 2024-08-04 DIAGNOSIS — Z95820 Peripheral vascular angioplasty status with implants and grafts: Secondary | ICD-10-CM | POA: Diagnosis not present

## 2024-08-04 DIAGNOSIS — Z8673 Personal history of transient ischemic attack (TIA), and cerebral infarction without residual deficits: Secondary | ICD-10-CM | POA: Diagnosis not present

## 2024-08-04 DIAGNOSIS — N182 Chronic kidney disease, stage 2 (mild): Secondary | ICD-10-CM | POA: Diagnosis not present

## 2024-08-04 DIAGNOSIS — Z7984 Long term (current) use of oral hypoglycemic drugs: Secondary | ICD-10-CM | POA: Diagnosis not present

## 2024-08-04 DIAGNOSIS — D631 Anemia in chronic kidney disease: Secondary | ICD-10-CM | POA: Diagnosis not present

## 2024-08-04 DIAGNOSIS — Z604 Social exclusion and rejection: Secondary | ICD-10-CM | POA: Diagnosis not present

## 2024-08-04 DIAGNOSIS — J9601 Acute respiratory failure with hypoxia: Secondary | ICD-10-CM | POA: Diagnosis not present

## 2024-08-12 DIAGNOSIS — Z79891 Long term (current) use of opiate analgesic: Secondary | ICD-10-CM | POA: Diagnosis not present

## 2024-08-12 DIAGNOSIS — Z48812 Encounter for surgical aftercare following surgery on the circulatory system: Secondary | ICD-10-CM | POA: Diagnosis not present

## 2024-08-12 DIAGNOSIS — E876 Hypokalemia: Secondary | ICD-10-CM | POA: Diagnosis not present

## 2024-08-12 DIAGNOSIS — Z7984 Long term (current) use of oral hypoglycemic drugs: Secondary | ICD-10-CM | POA: Diagnosis not present

## 2024-08-12 DIAGNOSIS — E1169 Type 2 diabetes mellitus with other specified complication: Secondary | ICD-10-CM | POA: Diagnosis not present

## 2024-08-12 DIAGNOSIS — Z7982 Long term (current) use of aspirin: Secondary | ICD-10-CM | POA: Diagnosis not present

## 2024-08-12 DIAGNOSIS — E039 Hypothyroidism, unspecified: Secondary | ICD-10-CM | POA: Diagnosis not present

## 2024-08-12 DIAGNOSIS — E782 Mixed hyperlipidemia: Secondary | ICD-10-CM | POA: Diagnosis not present

## 2024-08-12 DIAGNOSIS — E1122 Type 2 diabetes mellitus with diabetic chronic kidney disease: Secondary | ICD-10-CM | POA: Diagnosis not present

## 2024-08-12 DIAGNOSIS — I1310 Hypertensive heart and chronic kidney disease without heart failure, with stage 1 through stage 4 chronic kidney disease, or unspecified chronic kidney disease: Secondary | ICD-10-CM | POA: Diagnosis not present

## 2024-08-12 DIAGNOSIS — J9601 Acute respiratory failure with hypoxia: Secondary | ICD-10-CM | POA: Diagnosis not present

## 2024-08-12 DIAGNOSIS — N179 Acute kidney failure, unspecified: Secondary | ICD-10-CM | POA: Diagnosis not present

## 2024-08-12 DIAGNOSIS — Z604 Social exclusion and rejection: Secondary | ICD-10-CM | POA: Diagnosis not present

## 2024-08-12 DIAGNOSIS — E8809 Other disorders of plasma-protein metabolism, not elsewhere classified: Secondary | ICD-10-CM | POA: Diagnosis not present

## 2024-08-12 DIAGNOSIS — N182 Chronic kidney disease, stage 2 (mild): Secondary | ICD-10-CM | POA: Diagnosis not present

## 2024-08-12 DIAGNOSIS — D631 Anemia in chronic kidney disease: Secondary | ICD-10-CM | POA: Diagnosis not present

## 2024-08-12 DIAGNOSIS — Z87891 Personal history of nicotine dependence: Secondary | ICD-10-CM | POA: Diagnosis not present

## 2024-08-12 DIAGNOSIS — H34239 Retinal artery branch occlusion, unspecified eye: Secondary | ICD-10-CM | POA: Diagnosis not present

## 2024-08-12 DIAGNOSIS — E1151 Type 2 diabetes mellitus with diabetic peripheral angiopathy without gangrene: Secondary | ICD-10-CM | POA: Diagnosis not present

## 2024-08-12 DIAGNOSIS — Z794 Long term (current) use of insulin: Secondary | ICD-10-CM | POA: Diagnosis not present

## 2024-08-12 DIAGNOSIS — E113413 Type 2 diabetes mellitus with severe nonproliferative diabetic retinopathy with macular edema, bilateral: Secondary | ICD-10-CM | POA: Diagnosis not present

## 2024-08-12 DIAGNOSIS — Z95820 Peripheral vascular angioplasty status with implants and grafts: Secondary | ICD-10-CM | POA: Diagnosis not present

## 2024-08-12 DIAGNOSIS — Z8673 Personal history of transient ischemic attack (TIA), and cerebral infarction without residual deficits: Secondary | ICD-10-CM | POA: Diagnosis not present

## 2024-08-13 DIAGNOSIS — E1169 Type 2 diabetes mellitus with other specified complication: Secondary | ICD-10-CM | POA: Diagnosis not present

## 2024-08-13 DIAGNOSIS — D631 Anemia in chronic kidney disease: Secondary | ICD-10-CM | POA: Diagnosis not present

## 2024-08-13 DIAGNOSIS — Z794 Long term (current) use of insulin: Secondary | ICD-10-CM | POA: Diagnosis not present

## 2024-08-13 DIAGNOSIS — Z95820 Peripheral vascular angioplasty status with implants and grafts: Secondary | ICD-10-CM | POA: Diagnosis not present

## 2024-08-13 DIAGNOSIS — N182 Chronic kidney disease, stage 2 (mild): Secondary | ICD-10-CM | POA: Diagnosis not present

## 2024-08-13 DIAGNOSIS — E876 Hypokalemia: Secondary | ICD-10-CM | POA: Diagnosis not present

## 2024-08-13 DIAGNOSIS — Z87891 Personal history of nicotine dependence: Secondary | ICD-10-CM | POA: Diagnosis not present

## 2024-08-13 DIAGNOSIS — E113413 Type 2 diabetes mellitus with severe nonproliferative diabetic retinopathy with macular edema, bilateral: Secondary | ICD-10-CM | POA: Diagnosis not present

## 2024-08-13 DIAGNOSIS — Z8673 Personal history of transient ischemic attack (TIA), and cerebral infarction without residual deficits: Secondary | ICD-10-CM | POA: Diagnosis not present

## 2024-08-13 DIAGNOSIS — E1122 Type 2 diabetes mellitus with diabetic chronic kidney disease: Secondary | ICD-10-CM | POA: Diagnosis not present

## 2024-08-13 DIAGNOSIS — Z7982 Long term (current) use of aspirin: Secondary | ICD-10-CM | POA: Diagnosis not present

## 2024-08-13 DIAGNOSIS — Z48812 Encounter for surgical aftercare following surgery on the circulatory system: Secondary | ICD-10-CM | POA: Diagnosis not present

## 2024-08-13 DIAGNOSIS — H34239 Retinal artery branch occlusion, unspecified eye: Secondary | ICD-10-CM | POA: Diagnosis not present

## 2024-08-13 DIAGNOSIS — E782 Mixed hyperlipidemia: Secondary | ICD-10-CM | POA: Diagnosis not present

## 2024-08-13 DIAGNOSIS — E1151 Type 2 diabetes mellitus with diabetic peripheral angiopathy without gangrene: Secondary | ICD-10-CM | POA: Diagnosis not present

## 2024-08-13 DIAGNOSIS — I1310 Hypertensive heart and chronic kidney disease without heart failure, with stage 1 through stage 4 chronic kidney disease, or unspecified chronic kidney disease: Secondary | ICD-10-CM | POA: Diagnosis not present

## 2024-08-13 DIAGNOSIS — E8809 Other disorders of plasma-protein metabolism, not elsewhere classified: Secondary | ICD-10-CM | POA: Diagnosis not present

## 2024-08-13 DIAGNOSIS — Z7984 Long term (current) use of oral hypoglycemic drugs: Secondary | ICD-10-CM | POA: Diagnosis not present

## 2024-08-13 DIAGNOSIS — Z79891 Long term (current) use of opiate analgesic: Secondary | ICD-10-CM | POA: Diagnosis not present

## 2024-08-13 DIAGNOSIS — J9601 Acute respiratory failure with hypoxia: Secondary | ICD-10-CM | POA: Diagnosis not present

## 2024-08-13 DIAGNOSIS — E039 Hypothyroidism, unspecified: Secondary | ICD-10-CM | POA: Diagnosis not present

## 2024-08-13 DIAGNOSIS — N179 Acute kidney failure, unspecified: Secondary | ICD-10-CM | POA: Diagnosis not present

## 2024-08-13 DIAGNOSIS — Z604 Social exclusion and rejection: Secondary | ICD-10-CM | POA: Diagnosis not present

## 2024-08-14 ENCOUNTER — Ambulatory Visit: Admitting: Vascular Surgery

## 2024-08-14 ENCOUNTER — Encounter (HOSPITAL_COMMUNITY)

## 2024-08-17 DIAGNOSIS — E876 Hypokalemia: Secondary | ICD-10-CM | POA: Diagnosis not present

## 2024-08-17 DIAGNOSIS — N182 Chronic kidney disease, stage 2 (mild): Secondary | ICD-10-CM | POA: Diagnosis not present

## 2024-08-17 DIAGNOSIS — E039 Hypothyroidism, unspecified: Secondary | ICD-10-CM | POA: Diagnosis not present

## 2024-08-17 DIAGNOSIS — Z95820 Peripheral vascular angioplasty status with implants and grafts: Secondary | ICD-10-CM | POA: Diagnosis not present

## 2024-08-17 DIAGNOSIS — Z7982 Long term (current) use of aspirin: Secondary | ICD-10-CM | POA: Diagnosis not present

## 2024-08-17 DIAGNOSIS — N179 Acute kidney failure, unspecified: Secondary | ICD-10-CM | POA: Diagnosis not present

## 2024-08-17 DIAGNOSIS — E1151 Type 2 diabetes mellitus with diabetic peripheral angiopathy without gangrene: Secondary | ICD-10-CM | POA: Diagnosis not present

## 2024-08-17 DIAGNOSIS — J9601 Acute respiratory failure with hypoxia: Secondary | ICD-10-CM | POA: Diagnosis not present

## 2024-08-17 DIAGNOSIS — E1122 Type 2 diabetes mellitus with diabetic chronic kidney disease: Secondary | ICD-10-CM | POA: Diagnosis not present

## 2024-08-17 DIAGNOSIS — E113413 Type 2 diabetes mellitus with severe nonproliferative diabetic retinopathy with macular edema, bilateral: Secondary | ICD-10-CM | POA: Diagnosis not present

## 2024-08-17 DIAGNOSIS — E782 Mixed hyperlipidemia: Secondary | ICD-10-CM | POA: Diagnosis not present

## 2024-08-17 DIAGNOSIS — E1169 Type 2 diabetes mellitus with other specified complication: Secondary | ICD-10-CM | POA: Diagnosis not present

## 2024-08-17 DIAGNOSIS — Z794 Long term (current) use of insulin: Secondary | ICD-10-CM | POA: Diagnosis not present

## 2024-08-17 DIAGNOSIS — Z8673 Personal history of transient ischemic attack (TIA), and cerebral infarction without residual deficits: Secondary | ICD-10-CM | POA: Diagnosis not present

## 2024-08-17 DIAGNOSIS — Z604 Social exclusion and rejection: Secondary | ICD-10-CM | POA: Diagnosis not present

## 2024-08-17 DIAGNOSIS — Z7984 Long term (current) use of oral hypoglycemic drugs: Secondary | ICD-10-CM | POA: Diagnosis not present

## 2024-08-17 DIAGNOSIS — Z48812 Encounter for surgical aftercare following surgery on the circulatory system: Secondary | ICD-10-CM | POA: Diagnosis not present

## 2024-08-17 DIAGNOSIS — H34239 Retinal artery branch occlusion, unspecified eye: Secondary | ICD-10-CM | POA: Diagnosis not present

## 2024-08-17 DIAGNOSIS — Z79891 Long term (current) use of opiate analgesic: Secondary | ICD-10-CM | POA: Diagnosis not present

## 2024-08-17 DIAGNOSIS — Z87891 Personal history of nicotine dependence: Secondary | ICD-10-CM | POA: Diagnosis not present

## 2024-08-17 DIAGNOSIS — E8809 Other disorders of plasma-protein metabolism, not elsewhere classified: Secondary | ICD-10-CM | POA: Diagnosis not present

## 2024-08-17 DIAGNOSIS — D631 Anemia in chronic kidney disease: Secondary | ICD-10-CM | POA: Diagnosis not present

## 2024-08-17 DIAGNOSIS — I1310 Hypertensive heart and chronic kidney disease without heart failure, with stage 1 through stage 4 chronic kidney disease, or unspecified chronic kidney disease: Secondary | ICD-10-CM | POA: Diagnosis not present

## 2024-08-18 DIAGNOSIS — J9601 Acute respiratory failure with hypoxia: Secondary | ICD-10-CM | POA: Diagnosis not present

## 2024-08-18 DIAGNOSIS — N182 Chronic kidney disease, stage 2 (mild): Secondary | ICD-10-CM | POA: Diagnosis not present

## 2024-08-18 DIAGNOSIS — E039 Hypothyroidism, unspecified: Secondary | ICD-10-CM | POA: Diagnosis not present

## 2024-08-18 DIAGNOSIS — E1151 Type 2 diabetes mellitus with diabetic peripheral angiopathy without gangrene: Secondary | ICD-10-CM | POA: Diagnosis not present

## 2024-08-18 DIAGNOSIS — I1310 Hypertensive heart and chronic kidney disease without heart failure, with stage 1 through stage 4 chronic kidney disease, or unspecified chronic kidney disease: Secondary | ICD-10-CM | POA: Diagnosis not present

## 2024-08-18 DIAGNOSIS — E8809 Other disorders of plasma-protein metabolism, not elsewhere classified: Secondary | ICD-10-CM | POA: Diagnosis not present

## 2024-08-18 DIAGNOSIS — N179 Acute kidney failure, unspecified: Secondary | ICD-10-CM | POA: Diagnosis not present

## 2024-08-18 DIAGNOSIS — E1169 Type 2 diabetes mellitus with other specified complication: Secondary | ICD-10-CM | POA: Diagnosis not present

## 2024-08-18 DIAGNOSIS — Z794 Long term (current) use of insulin: Secondary | ICD-10-CM | POA: Diagnosis not present

## 2024-08-18 DIAGNOSIS — Z7982 Long term (current) use of aspirin: Secondary | ICD-10-CM | POA: Diagnosis not present

## 2024-08-18 DIAGNOSIS — E782 Mixed hyperlipidemia: Secondary | ICD-10-CM | POA: Diagnosis not present

## 2024-08-18 DIAGNOSIS — D631 Anemia in chronic kidney disease: Secondary | ICD-10-CM | POA: Diagnosis not present

## 2024-08-18 DIAGNOSIS — Z87891 Personal history of nicotine dependence: Secondary | ICD-10-CM | POA: Diagnosis not present

## 2024-08-18 DIAGNOSIS — E113413 Type 2 diabetes mellitus with severe nonproliferative diabetic retinopathy with macular edema, bilateral: Secondary | ICD-10-CM | POA: Diagnosis not present

## 2024-08-18 DIAGNOSIS — Z604 Social exclusion and rejection: Secondary | ICD-10-CM | POA: Diagnosis not present

## 2024-08-18 DIAGNOSIS — Z7984 Long term (current) use of oral hypoglycemic drugs: Secondary | ICD-10-CM | POA: Diagnosis not present

## 2024-08-18 DIAGNOSIS — E876 Hypokalemia: Secondary | ICD-10-CM | POA: Diagnosis not present

## 2024-08-18 DIAGNOSIS — Z8673 Personal history of transient ischemic attack (TIA), and cerebral infarction without residual deficits: Secondary | ICD-10-CM | POA: Diagnosis not present

## 2024-08-18 DIAGNOSIS — Z79891 Long term (current) use of opiate analgesic: Secondary | ICD-10-CM | POA: Diagnosis not present

## 2024-08-18 DIAGNOSIS — Z48812 Encounter for surgical aftercare following surgery on the circulatory system: Secondary | ICD-10-CM | POA: Diagnosis not present

## 2024-08-18 DIAGNOSIS — E1122 Type 2 diabetes mellitus with diabetic chronic kidney disease: Secondary | ICD-10-CM | POA: Diagnosis not present

## 2024-08-18 DIAGNOSIS — Z95820 Peripheral vascular angioplasty status with implants and grafts: Secondary | ICD-10-CM | POA: Diagnosis not present

## 2024-08-18 DIAGNOSIS — H34239 Retinal artery branch occlusion, unspecified eye: Secondary | ICD-10-CM | POA: Diagnosis not present

## 2024-08-20 ENCOUNTER — Encounter: Payer: Self-pay | Admitting: Podiatry

## 2024-08-20 ENCOUNTER — Ambulatory Visit (INDEPENDENT_AMBULATORY_CARE_PROVIDER_SITE_OTHER)

## 2024-08-20 ENCOUNTER — Ambulatory Visit: Admitting: Podiatry

## 2024-08-20 DIAGNOSIS — S92912A Unspecified fracture of left toe(s), initial encounter for closed fracture: Secondary | ICD-10-CM

## 2024-08-20 NOTE — Progress Notes (Signed)
  Subjective:  Patient ID: Sara [name], female    DOB: 1946/10/26,  MRN: [id number]  Chief Complaint  Patient presents with   Fracture    Here today for FU of left 4th and 5th toe FX. Feeling much better. Report no pain today. She did come in wearing her post op shoe.  Daughter and Intrep. Are here today.  A1c 8 in July ASA    Discussed the use of AI scribe software for clinical note transcription with the patient, who gave verbal consent to proceed.  History of Present Illness Sara [name] is a 78 year old female who presents for follow-up of fractured fourth and fifth toes.  She has experienced significant improvement in her foot condition since the last visit. Wearing a surgical shoe for over two weeks has reduced pain and swelling. Pain has decreased overall, though some discomfort persists with toe movement.  The injury occurred approximately one month ago. X-rays show an avulsion fracture at the medial base of the fourth toe proximal phalanx and a transverse fracture at the base of the fifth toe proximal phalanx.  She sometimes removes the surgical shoe while seated in her recliner, ensuring not to bear weight on the foot without the shoe. She does not wear the shoe while sleeping and avoids unnecessary toe movement.  She inquires about using cream for dry skin on her foot. She is advised to use unscented lotions like Lubriderm or Eucerin, and heavier options like Aquaphor or Vaseline if needed, avoiding application between the toes due to her diabetes.  No significant pain at this point and denies any new symptoms.      Objective:    Physical Exam Left foot 1/4 pedal pulses.  Edema and ecchymosis resolved to the dorsal lateral forefoot.  Mild pain on palpation from the midshaft of the proximal phalanges of the 4th and 5th toes.  Range of motion testing deferred today.  Toe is sitting in a good rectus position.  No gross deformity present.  Epicritic sensation  intact.    No images are attached to the encounter.    Results RADIOLOGY Foot X-ray: Maintained alignment of avulsion fracture at the medial base of the fourth toe proximal phalanx and transverse fracture at the base of the fifth toe proximal phalanx. Very slight resorption of fracture lines present, minimal callus formation at this point. (07/30/2024)   Assessment:   1. Closed nondisplaced fracture of phalanx of toe of left foot, unspecified toe, initial encounter      Plan:  Patient was evaluated and treated and all questions answered.  Assessment and Plan Assessment & Plan Nondisplaced fractures of left fourth and fifth toe proximal phalanges Fractures not fully healed at this point. X-rays show maintained alignment with slight resorption and minimal callus formation. - Continue surgical shoe for four weeks to prevent movement and ensure healing. - Avoid weight-bearing without surgical shoe. - Refrain from pressure-inducing movements on toes without shoe. - Follow-up in four weeks with current provider or Dr. Loel to reassess healing. - Use unscented lotion for dry skin, avoid between toes.      Return in about 4 weeks (around 09/17/2024) for Toe Fracture XR.

## 2024-09-17 ENCOUNTER — Encounter: Admitting: Podiatry

## 2024-09-17 NOTE — Progress Notes (Signed)
Patient did not show for scheduled appointment today.

## 2024-09-24 ENCOUNTER — Other Ambulatory Visit (HOSPITAL_BASED_OUTPATIENT_CLINIC_OR_DEPARTMENT_OTHER): Payer: Self-pay | Admitting: Family Medicine

## 2024-09-24 DIAGNOSIS — Z780 Asymptomatic menopausal state: Secondary | ICD-10-CM

## 2024-10-17 ENCOUNTER — Encounter (HOSPITAL_COMMUNITY): Payer: Self-pay | Admitting: *Deleted

## 2024-10-17 ENCOUNTER — Other Ambulatory Visit: Payer: Self-pay

## 2024-10-17 ENCOUNTER — Inpatient Hospital Stay (HOSPITAL_COMMUNITY)
Admission: EM | Admit: 2024-10-17 | Discharge: 2024-10-23 | DRG: 065 | Disposition: A | Attending: Family Medicine | Admitting: Family Medicine

## 2024-10-17 DIAGNOSIS — E1165 Type 2 diabetes mellitus with hyperglycemia: Secondary | ICD-10-CM | POA: Diagnosis present

## 2024-10-17 DIAGNOSIS — R5381 Other malaise: Secondary | ICD-10-CM | POA: Diagnosis present

## 2024-10-17 DIAGNOSIS — E876 Hypokalemia: Secondary | ICD-10-CM | POA: Diagnosis present

## 2024-10-17 DIAGNOSIS — E039 Hypothyroidism, unspecified: Secondary | ICD-10-CM | POA: Diagnosis present

## 2024-10-17 DIAGNOSIS — R29898 Other symptoms and signs involving the musculoskeletal system: Principal | ICD-10-CM

## 2024-10-17 DIAGNOSIS — E782 Mixed hyperlipidemia: Secondary | ICD-10-CM | POA: Diagnosis present

## 2024-10-17 DIAGNOSIS — I739 Peripheral vascular disease, unspecified: Secondary | ICD-10-CM | POA: Diagnosis present

## 2024-10-17 DIAGNOSIS — I639 Cerebral infarction, unspecified: Secondary | ICD-10-CM | POA: Diagnosis present

## 2024-10-17 DIAGNOSIS — E66812 Obesity, class 2: Secondary | ICD-10-CM | POA: Diagnosis present

## 2024-10-17 DIAGNOSIS — I129 Hypertensive chronic kidney disease with stage 1 through stage 4 chronic kidney disease, or unspecified chronic kidney disease: Secondary | ICD-10-CM | POA: Diagnosis present

## 2024-10-17 LAB — CBC
HCT: 41.2 % (ref 36.0–46.0)
Hemoglobin: 13.1 g/dL (ref 12.0–15.0)
MCH: 27.4 pg (ref 26.0–34.0)
MCHC: 31.8 g/dL (ref 30.0–36.0)
MCV: 86.2 fL (ref 80.0–100.0)
Platelets: 259 K/uL (ref 150–400)
RBC: 4.78 MIL/uL (ref 3.87–5.11)
RDW: 16.3 % — ABNORMAL HIGH (ref 11.5–15.5)
WBC: 8.4 K/uL (ref 4.0–10.5)
nRBC: 0 % (ref 0.0–0.2)

## 2024-10-17 LAB — URINALYSIS, ROUTINE W REFLEX MICROSCOPIC
Bilirubin Urine: NEGATIVE
Glucose, UA: NEGATIVE mg/dL
Hgb urine dipstick: NEGATIVE
Ketones, ur: NEGATIVE mg/dL
Leukocytes,Ua: NEGATIVE
Nitrite: NEGATIVE
Protein, ur: NEGATIVE mg/dL
Specific Gravity, Urine: 1.012 (ref 1.005–1.030)
pH: 7 (ref 5.0–8.0)

## 2024-10-17 LAB — COMPREHENSIVE METABOLIC PANEL WITH GFR
ALT: 17 U/L (ref 0–44)
AST: 20 U/L (ref 15–41)
Albumin: 3.6 g/dL (ref 3.5–5.0)
Alkaline Phosphatase: 89 U/L (ref 38–126)
Anion gap: 12 (ref 5–15)
BUN: 20 mg/dL (ref 8–23)
CO2: 30 mmol/L (ref 22–32)
Calcium: 9 mg/dL (ref 8.9–10.3)
Chloride: 95 mmol/L — ABNORMAL LOW (ref 98–111)
Creatinine, Ser: 0.86 mg/dL (ref 0.44–1.00)
GFR, Estimated: >60 mL/min (ref >60)
Glucose, Bld: 180 mg/dL — ABNORMAL HIGH (ref 70–99)
Potassium: 3.4 mmol/L — ABNORMAL LOW (ref 3.5–5.1)
Sodium: 137 mmol/L (ref 135–145)
Total Bilirubin: 0.9 mg/dL (ref 0.0–1.2)
Total Protein: 7.8 g/dL (ref 6.5–8.1)

## 2024-10-17 NOTE — ED Triage Notes (Signed)
 Pt states R femoral endarterectomy in August.  Since Monday she has been having R foot numbness.  No increased swelling or discoloration.  Cap refil less than 3.

## 2024-10-17 NOTE — ED Triage Notes (Signed)
 Both legs are cool the rt ankle is sl swollen and the pt reports that  since Monday  she has been losing her balance   her rt leg is cooler than the lt but she has a dopplered pulse there

## 2024-10-18 ENCOUNTER — Inpatient Hospital Stay (HOSPITAL_COMMUNITY)

## 2024-10-18 ENCOUNTER — Emergency Department (HOSPITAL_COMMUNITY)

## 2024-10-18 DIAGNOSIS — Z794 Long term (current) use of insulin: Secondary | ICD-10-CM | POA: Diagnosis not present

## 2024-10-18 DIAGNOSIS — Z833 Family history of diabetes mellitus: Secondary | ICD-10-CM | POA: Diagnosis not present

## 2024-10-18 DIAGNOSIS — E039 Hypothyroidism, unspecified: Secondary | ICD-10-CM | POA: Diagnosis present

## 2024-10-18 DIAGNOSIS — E782 Mixed hyperlipidemia: Secondary | ICD-10-CM

## 2024-10-18 DIAGNOSIS — Z7984 Long term (current) use of oral hypoglycemic drugs: Secondary | ICD-10-CM | POA: Diagnosis not present

## 2024-10-18 DIAGNOSIS — E785 Hyperlipidemia, unspecified: Secondary | ICD-10-CM | POA: Diagnosis not present

## 2024-10-18 DIAGNOSIS — E1165 Type 2 diabetes mellitus with hyperglycemia: Secondary | ICD-10-CM

## 2024-10-18 DIAGNOSIS — Z87891 Personal history of nicotine dependence: Secondary | ICD-10-CM | POA: Diagnosis not present

## 2024-10-18 DIAGNOSIS — E66812 Obesity, class 2: Secondary | ICD-10-CM | POA: Diagnosis present

## 2024-10-18 DIAGNOSIS — Z8673 Personal history of transient ischemic attack (TIA), and cerebral infarction without residual deficits: Secondary | ICD-10-CM | POA: Diagnosis not present

## 2024-10-18 DIAGNOSIS — G9389 Other specified disorders of brain: Secondary | ICD-10-CM | POA: Diagnosis present

## 2024-10-18 DIAGNOSIS — Z8249 Family history of ischemic heart disease and other diseases of the circulatory system: Secondary | ICD-10-CM | POA: Diagnosis not present

## 2024-10-18 DIAGNOSIS — R29703 NIHSS score 3: Secondary | ICD-10-CM | POA: Diagnosis present

## 2024-10-18 DIAGNOSIS — I639 Cerebral infarction, unspecified: Secondary | ICD-10-CM | POA: Diagnosis present

## 2024-10-18 DIAGNOSIS — G8191 Hemiplegia, unspecified affecting right dominant side: Secondary | ICD-10-CM | POA: Diagnosis present

## 2024-10-18 DIAGNOSIS — R5381 Other malaise: Secondary | ICD-10-CM | POA: Diagnosis not present

## 2024-10-18 DIAGNOSIS — E1151 Type 2 diabetes mellitus with diabetic peripheral angiopathy without gangrene: Secondary | ICD-10-CM | POA: Diagnosis present

## 2024-10-18 DIAGNOSIS — Z7902 Long term (current) use of antithrombotics/antiplatelets: Secondary | ICD-10-CM | POA: Diagnosis not present

## 2024-10-18 DIAGNOSIS — I10 Essential (primary) hypertension: Secondary | ICD-10-CM | POA: Diagnosis not present

## 2024-10-18 DIAGNOSIS — E876 Hypokalemia: Secondary | ICD-10-CM | POA: Diagnosis not present

## 2024-10-18 DIAGNOSIS — I739 Peripheral vascular disease, unspecified: Secondary | ICD-10-CM | POA: Diagnosis not present

## 2024-10-18 DIAGNOSIS — I6300 Cerebral infarction due to thrombosis of unspecified precerebral artery: Secondary | ICD-10-CM | POA: Diagnosis not present

## 2024-10-18 DIAGNOSIS — I129 Hypertensive chronic kidney disease with stage 1 through stage 4 chronic kidney disease, or unspecified chronic kidney disease: Secondary | ICD-10-CM | POA: Diagnosis not present

## 2024-10-18 DIAGNOSIS — Z6837 Body mass index (BMI) 37.0-37.9, adult: Secondary | ICD-10-CM | POA: Diagnosis not present

## 2024-10-18 DIAGNOSIS — N182 Chronic kidney disease, stage 2 (mild): Secondary | ICD-10-CM | POA: Diagnosis present

## 2024-10-18 DIAGNOSIS — Z821 Family history of blindness and visual loss: Secondary | ICD-10-CM | POA: Diagnosis not present

## 2024-10-18 DIAGNOSIS — I6329 Cerebral infarction due to unspecified occlusion or stenosis of other precerebral arteries: Secondary | ICD-10-CM | POA: Diagnosis present

## 2024-10-18 DIAGNOSIS — E1122 Type 2 diabetes mellitus with diabetic chronic kidney disease: Secondary | ICD-10-CM | POA: Diagnosis present

## 2024-10-18 DIAGNOSIS — I6389 Other cerebral infarction: Secondary | ICD-10-CM | POA: Diagnosis not present

## 2024-10-18 DIAGNOSIS — Z7982 Long term (current) use of aspirin: Secondary | ICD-10-CM | POA: Diagnosis not present

## 2024-10-18 DIAGNOSIS — Z79899 Other long term (current) drug therapy: Secondary | ICD-10-CM | POA: Diagnosis not present

## 2024-10-18 LAB — LIPID PANEL
Cholesterol: 104 mg/dL (ref 0–200)
HDL: 40 mg/dL — ABNORMAL LOW (ref >40)
LDL Cholesterol: 44 mg/dL (ref 0–99)
Total CHOL/HDL Ratio: 2.6 ratio
Triglycerides: 98 mg/dL (ref <150)
VLDL: 20 mg/dL (ref 0–40)

## 2024-10-18 LAB — HEMOGLOBIN A1C
Hgb A1c MFr Bld: 8.4 % — ABNORMAL HIGH (ref 4.8–5.6)
Mean Plasma Glucose: 194.38 mg/dL

## 2024-10-18 LAB — GLUCOSE, CAPILLARY
Glucose-Capillary: 151 mg/dL — ABNORMAL HIGH (ref 70–99)
Glucose-Capillary: 130 mg/dL — ABNORMAL HIGH (ref 70–99)
Glucose-Capillary: 104 mg/dL — ABNORMAL HIGH (ref 70–99)

## 2024-10-18 LAB — TSH: TSH: 2.645 u[IU]/mL (ref 0.350–4.500)

## 2024-10-18 MED ORDER — SENNOSIDES-DOCUSATE SODIUM 8.6-50 MG PO TABS: 1.0000 | ORAL_TABLET | Freq: Every evening | ORAL | Status: DC | PRN

## 2024-10-18 MED ORDER — ATORVASTATIN CALCIUM 40 MG PO TABS
40.0000 mg | ORAL_TABLET | Freq: Every day | ORAL | Status: DC
  Administered 2024-10-18 – 2024-10-23 (×6): 40 mg via ORAL
  Filled 2024-10-18 (×5): qty 1
  Filled 2024-10-18: qty 4

## 2024-10-18 MED ORDER — ASPIRIN 81 MG PO TBEC: 81.0000 mg | DELAYED_RELEASE_TABLET | Freq: Every day | ORAL | Status: DC

## 2024-10-18 MED ORDER — STROKE: EARLY STAGES OF RECOVERY BOOK
Freq: Once | Status: AC
  Filled 2024-10-18 (×2): qty 1

## 2024-10-18 MED ORDER — LOSARTAN POTASSIUM-HCTZ 50-12.5 MG PO TABS: 2.0000 | ORAL_TABLET | Freq: Every day | ORAL | Status: DC

## 2024-10-18 MED ORDER — SODIUM CHLORIDE 0.9 % IV SOLN: INTRAVENOUS | Status: DC

## 2024-10-18 MED ORDER — ACETAMINOPHEN 650 MG RE SUPP: 650.0000 mg | RECTAL | Status: DC | PRN

## 2024-10-18 MED ORDER — INSULIN ASPART 100 UNIT/ML IJ SOLN
0.0000 [IU] | Freq: Every day | INTRAMUSCULAR | Status: DC
  Administered 2024-10-19: 4 [IU] via SUBCUTANEOUS
  Administered 2024-10-20: 3 [IU] via SUBCUTANEOUS
  Administered 2024-10-21 – 2024-10-22 (×2): 2 [IU] via SUBCUTANEOUS
  Filled 2024-10-18: qty 3
  Filled 2024-10-18 (×2): qty 2
  Filled 2024-10-18: qty 3

## 2024-10-18 MED ORDER — MECLIZINE HCL 25 MG PO TABS: 12.5000 mg | ORAL_TABLET | Freq: Three times a day (TID) | ORAL | Status: DC | PRN

## 2024-10-18 MED ORDER — ASPIRIN 81 MG PO TBEC
81.0000 mg | DELAYED_RELEASE_TABLET | Freq: Every day | ORAL | Status: DC
  Administered 2024-10-18 – 2024-10-23 (×6): 81 mg via ORAL
  Filled 2024-10-18 (×6): qty 1

## 2024-10-18 MED ORDER — CLOPIDOGREL BISULFATE 75 MG PO TABS
75.0000 mg | ORAL_TABLET | Freq: Every day | ORAL | Status: DC
  Administered 2024-10-18 – 2024-10-23 (×6): 75 mg via ORAL
  Filled 2024-10-18 (×6): qty 1

## 2024-10-18 MED ORDER — ASPIRIN 325 MG PO TABS: 325.0000 mg | ORAL_TABLET | Freq: Once | ORAL | Status: DC

## 2024-10-18 MED ORDER — ENOXAPARIN SODIUM 40 MG/0.4ML IJ SOSY
40.0000 mg | PREFILLED_SYRINGE | INTRAMUSCULAR | Status: DC
  Administered 2024-10-18 – 2024-10-22 (×5): 40 mg via SUBCUTANEOUS
  Filled 2024-10-18 (×5): qty 0.4

## 2024-10-18 MED ORDER — INSULIN ASPART PROT & ASPART (70-30 MIX) 100 UNIT/ML ~~LOC~~ SUSP: 30.0000 [IU] | Freq: Two times a day (BID) | SUBCUTANEOUS | Status: DC

## 2024-10-18 MED ORDER — IOHEXOL 350 MG/ML SOLN
100.0000 mL | Freq: Once | INTRAVENOUS | Status: AC | PRN
  Administered 2024-10-18: 100 mL via INTRAVENOUS

## 2024-10-18 MED ORDER — ACETAMINOPHEN 160 MG/5ML PO SOLN: 650.0000 mg | ORAL | Status: DC | PRN

## 2024-10-18 MED ORDER — LEVOTHYROXINE SODIUM 50 MCG PO TABS
50.0000 ug | ORAL_TABLET | Freq: Every day | ORAL | Status: AC
  Administered 2024-10-18 – 2024-10-23 (×6): 50 ug via ORAL
  Filled 2024-10-18 (×2): qty 1
  Filled 2024-10-18: qty 2
  Filled 2024-10-18 (×3): qty 1

## 2024-10-18 MED ORDER — POTASSIUM CHLORIDE 10 MEQ/100ML IV SOLN
10.0000 meq | INTRAVENOUS | Status: AC
  Administered 2024-10-18 (×2): 10 meq via INTRAVENOUS
  Filled 2024-10-18 (×2): qty 100

## 2024-10-18 MED ORDER — INSULIN ASPART PROT & ASPART (70-30 MIX) 100 UNIT/ML ~~LOC~~ SUSP
50.0000 [IU] | Freq: Every day | SUBCUTANEOUS | Status: DC
  Administered 2024-10-18: 50 [IU] via SUBCUTANEOUS
  Filled 2024-10-18: qty 10

## 2024-10-18 MED ORDER — POTASSIUM CHLORIDE 10 MEQ/100ML IV SOLN: 10.0000 meq | INTRAVENOUS | Status: DC

## 2024-10-18 MED ORDER — ACETAMINOPHEN 325 MG PO TABS: 650.0000 mg | ORAL_TABLET | ORAL | Status: DC | PRN

## 2024-10-18 MED ORDER — INSULIN ASPART PROT & ASPART (70-30 MIX) 100 UNIT/ML ~~LOC~~ SUSP
30.0000 [IU] | Freq: Every day | SUBCUTANEOUS | Status: DC
  Filled 2024-10-18: qty 10

## 2024-10-18 NOTE — Progress Notes (Incomplete)
 STROKE TEAM PROGRESS NOTE    SIGNIFICANT HOSPITAL EVENTS 12/6pm-12/7 earlyam: p/w RLE weakness and numbness x several days.  MRI brain: acute ischemic stroke within the left pons.   INTERIM HISTORY/SUBJECTIVE Feeling much better today, sitting up on the side of the bed eating breakfast.  Strength is improved but states she is not at baseline. She does have two broken toes in her left foot and she does have a surgical boot at the bedside. Denies pain. Using walker in room.   OBJECTIVE  CBC    Component Value Date/Time   WBC 8.4 10/17/2024 2210   RBC 4.78 10/17/2024 2210   HGB 13.1 10/17/2024 2210   HCT 41.2 10/17/2024 2210   PLT 259 10/17/2024 2210   MCV 86.2 10/17/2024 2210   MCH 27.4 10/17/2024 2210   MCHC 31.8 10/17/2024 2210   RDW 16.3 (H) 10/17/2024 2210   LYMPHSABS 2.2 06/20/2024 0359   MONOABS 0.8 06/20/2024 0359   EOSABS 0.3 06/20/2024 0359   BASOSABS 0.0 06/20/2024 0359    BMET    Component Value Date/Time   NA 137 10/17/2024 2210   K 3.4 (L) 10/17/2024 2210   CL 95 (L) 10/17/2024 2210   CO2 30 10/17/2024 2210   GLUCOSE 180 (H) 10/17/2024 2210   BUN 20 10/17/2024 2210   CREATININE 0.86 10/17/2024 2210   CALCIUM  9.0 10/17/2024 2210   GFRNONAA >60 10/17/2024 2210    IMAGING past 24 hours DG CHEST PORT 1 VIEW Result Date: 10/18/2024 EXAM: 1 VIEW(S) XRAY OF THE CHEST 10/18/2024 09:09:00 AM COMPARISON: 06/20/2024 CLINICAL HISTORY: CVA (cerebral vascular accident) (HCC) FINDINGS: LUNGS AND PLEURA: Hypoinflated lungs. No focal pulmonary opacity. No pleural effusion. No pneumothorax. HEART AND MEDIASTINUM: No acute abnormality of the cardiac and mediastinal silhouettes. BONES AND SOFT TISSUES: No acute osseous abnormality. IMPRESSION: 1. Hypoinflated lungs. Electronically signed by: Evalene Coho MD 10/18/2024 09:13 AM EST RP Workstation: [id number]   MR BRAIN WO CONTRAST Result Date: 10/18/2024 EXAM: MRI BRAIN WITHOUT CONTRAST 10/18/2024 06:12:47 AM TECHNIQUE:  Multiplanar multisequence MRI of the head/brain was performed without the administration of intravenous contrast. COMPARISON: CT of the head dated 10/18/2024. CLINICAL HISTORY: Neuro deficit, acute, stroke suspected. FINDINGS: BRAIN AND VENTRICLES: There is restricted diffusion present within the left pons on image 64 series 5, compatible with an acute nonhemorrhagic lacunar infarct. There are chronic encephalomalacia changes present within the left basal ganglia and external capsule. There is extensive periventricular and deep cerebral white matter disease. No intracranial hemorrhage. No mass. No midline shift. No hydrocephalus. The sella is unremarkable. Normal flow voids. ORBITS: No acute abnormality. SINUSES AND MASTOIDS: No acute abnormality. BONES AND SOFT TISSUES: Normal marrow signal. No acute soft tissue abnormality. IMPRESSION: 1. Acute nonhemorrhagic lacunar infarct in the left pons. 2. Chronic encephalomalacia changes in the left basal ganglia and external capsule. 3. Extensive periventricular and deep cerebral white matter disease. Electronically signed by: Evalene Coho MD 10/18/2024 06:20 AM EST RP Workstation: [id number]   CT Angio Aortobifemoral W and/or Wo Contrast Result Date: 10/18/2024 EXAM: CTA ABDOMEN AND PELVIS WITH AND WITHOUT CONTRAST AND RUNOFF CTA OF THE LOWER EXTREMITIES WITH CONTRAST 10/18/2024 03:20:52 AM TECHNIQUE: CTA images of the abdomen, pelvis and lower extremities with and without intravenous contrast. Three-dimensional MIP/volume rendered formations were performed. Automated exposure control, iterative reconstruction, and/or weight based adjustment of the mA/kV was utilized to reduce the radiation dose to as low as reasonably achievable. COMPARISON: None available. CLINICAL HISTORY: Claudication or leg ischemia. FINDINGS: VASCULATURE: AORTA: No acute  finding. No abdominal aortic aneurysm. No dissection. 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 Distally, the celiac axis demonstrates classic anatomic configuration and is widely patent. SUPERIOR MESENTERIC ARTERY: Dictating a filling defect within the superior mesenteric artery at its origin is likely artifactual and related to streak artifact. No hemodynamically significant stenosis. No aneurysm or dissection. INFERIOR MESENTERIC ARTERY: 50% stenosis of the inferior mesenteric artery at its origin secondary to calcified atherosclerotic plaque. Distally, the vessel is widely patent. RENAL ARTERIES: 50-75% stenosis of the proximal left renal artery secondary to calcified atherosclerotic plaque. Single renal arteries demonstrate normal vascular morphology. No aneurysm or dissection. RIGHT ILIAC ARTERIES: Extensive calcified atherosclerotic plaque within the common iliac artery. No hemodynamically significant stenosis. The internal iliac artery is patent at its origin. RIGHT FEMORAL ARTERIES: Surgical changes of right common femoral endarterectomy. 50% stenosis of the superficial femoral artery at its origin. Extensive calcified atherosclerotic plaque is seen throughout the superficial femoral artery with serial hemodynamically significant stenosis throughout its length of up to 50%. Profunda femoral artery is widely patent. RIGHT POPLITEAL ARTERY: Extensive atherosclerotic plaque resulting in a 50-75% stenosis of the P1 segment of the popliteal artery and greater than 75% stenosis of the P2 segment and P3 segments of the popliteal artery. RIGHT CALF ARTERIES: Extensive arteriosclerosis of the calf vasculature with 3-vessel runoff to the right ankle and patency of the dorsalis pedis and plantar arch. LEFT ILIAC ARTERIES: Extensive calcified atherosclerotic disease plaque without hemodynamically significant stenosis. Internal iliac artery is patent. LEFT FEMORAL ARTERIES: Wide patency of the common femoral artery. Extensive scattered calcified  atherosclerotic plaque is seen throughout the superficial femoral artery, resulting in multifocal stenoses at 50%. Profunda femoral artery is widely patent. LEFT POPLITEAL ARTERY: Scattered calcified atherosclerotic plaque resulting in a focal 50% stenosis of the P1 segment of the popliteal artery and multiple 50-75% stenosis of the P2 and P3 segments of the popliteal artery. LEFT CALF ARTERIES: Extensive arteriosclerosis of the calf vasculature. 3-vessel runoff to the ankle with patency of the dorsalis pedis and plantar arch. Right lower extremity arterial inflow: Extensive calcified atherosclerotic plaque within the common iliac artery. No hemodynamically significant stenosis. The internal iliac artery is patent at its origin. Right lower extremity arterial outflow: Surgical changes of right common femoral endarterectomy. 50% stenosis of the superficial femoral artery at its origin. Extensive calcified atherosclerotic plaque is seen throughout the superficial femoral artery with serial hemodynamically significant stenosis throughout its length of up to 50%. Profunda femoral artery is widely patent. Right lower extremity runoff: Extensive atherosclerotic plaque resulting in a 50-75% stenosis of the P1 segment of the popliteal artery and greater than 75% stenosis of the P2 segment and P3 segments of the popliteal artery. Extensive arteriosclerosis of the calf vasculature with 3-vessel runoff to the right ankle and patency of the dorsalis pedis and plantar arch. Left lower extremity arterial inflow: Extensive calcified atherosclerotic disease plaque without hemodynamically significant stenosis. Internal iliac artery is patent. Left lower extremity arterial outflow: Wide patency of the common femoral artery. Extensive scattered calcified atherosclerotic plaque is seen throughout the superficial femoral artery, resulting in multifocal stenoses at 50%. Profunda femoral artery is widely patent. Left lower extremity  arterial runoff: Scattered calcified atherosclerotic plaque resulting in a focal 50% stenosis of the P1 segment of the popliteal artery and multiple 50-75% stenosis of the P2 and P3 segments of the popliteal artery. Extensive arteriosclerosis of the calf vasculature. 3-vessel  runoff to the ankle with patency of the dorsalis pedis and plantar arch. ABDOMEN AND PELVIS: LOWER CHEST: Visualized portion of the lower chest demonstrates no acute abnormality. LIVER: The liver is unremarkable. GALLBLADDER AND BILE DUCTS: Gallbladder is unremarkable. No biliary ductal dilatation. SPLEEN: The spleen is unremarkable. PANCREAS: The pancreas is unremarkable. ADRENAL GLANDS: 8 mm benign right adrenal adenoma. No follow-up imaging is recommended. KIDNEYS, URETERS AND BLADDER: No stones in the kidneys or ureters. No hydronephrosis. No evidence of perinephric or periureteral stranding. Urinary bladder is unremarkable. GI AND Bowel: Severe sigmoid diverticulosis. No superimposed focal inflammatory change. The stomach, small bowel, and large bowel are otherwise unremarkable. The appendix is absent. REPRODUCTIVE: Reproductive organs are unremarkable. PERITONEUM AND RETROPERITONEUM: No ascites or free air. LYMPH NODES: No evidence of lymphadenopathy. BONES AND SOFT TISSUES: 2.2 x 2.7 cm high attenuation cystic collection within the right groin may represent a hematoma or abscess, postoperative seroma, or chronic hematoma. Infiltrative postoperative changes noted within the right groin superficial to the common femoral artery. Small bilateral knee effusions. Bilateral pedal edema. Asymmetric fatty atrophy of the right gastrocnemius musculature. Osseous structures are age-appropriate. No acute bone abnormality. No lytic or blastic bone lesion. Extensive coronary artery calcifications. IMPRESSION: 1. 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 2. 50-75% stenosis  of the proximal left renal artery due to calcified atherosclerotic plaque; no aneurysm or dissection. 3. 50% stenosis of the inferior mesenteric artery at its origin due to calcified atherosclerotic plaque; distal vessel widely patent. Given the significant stenosis within the celiac axis , clinical correlation for signs and symptoms of chronic mesenteric ischemia is recommended. 4. Right lower extremity arterial inflow: Extensive calcified atherosclerotic plaque within the common iliac artery without hemodynamically significant stenosis; internal iliac artery patent. 5. Right lower extremity arterial outflow: Surgical changes of right common femoral endarterectomy with 50% stenosis at the superficial femoral artery origin and serial stenoses up to 50% throughout its length; profunda femoral artery widely patent. 6. Right lower extremity arterial runoff: 50-75% stenosis of the P1 popliteal segment and greater than 75% stenosis of the P2 and P3 popliteal segments; three-vessel runoff to the ankle with patent dorsalis pedis and plantar arch. 7. Left lower extremity arterial inflow: Extensive calcified atherosclerotic plaque without hemodynamically significant stenosis; internal iliac artery patent. 8. Left lower extremity arterial outflow: Multifocal 50% stenoses in the superficial femoral artery; profunda femoral artery widely patent. 9. Left lower extremity arterial runoff: Focal 50% stenosis of the P1 popliteal segment and multiple 50-75% stenoses of the P2 and V3 popliteal segments; three-vessel runoff to the ankle with patent dorsalis pedis and plantar arch. 10. Severe sigmoid diverticulosis without superimposed inflammatory change. 11. Postoperative right groin fluid collection measuring 2.2 x 2.7 cm, differential includes hematoma, abscess, or seroma; correlate clinically and consider targeted ultrasound if indicated. Electronically signed by: Dorethia Molt MD 10/18/2024 04:04 AM EST RP Workstation: [id number]    CT HEAD WO CONTRAST ( ) Result Date: 10/18/2024 EXAM: CT HEAD WITHOUT CONTRAST 10/18/2024 03:24:00 AM TECHNIQUE: CT of the head was performed without the administration of intravenous contrast. Automated exposure control, iterative reconstruction, and/or weight based adjustment of the mA/kV was utilized to reduce the radiation dose to as low as reasonably achievable. COMPARISON: None available. CLINICAL HISTORY: Neuro deficit, acute, stroke suspected FINDINGS: BRAIN AND VENTRICLES: No acute hemorrhage. No evidence of acute infarct. Remote left basal ganglia infarct. Patchy white matter hypodensities compatible with chronic microvascular  ischemic changes. No hydrocephalus. No extra-axial collection. No mass effect or midline shift. ORBITS: No acute abnormality. SINUSES: No acute abnormality. SOFT TISSUES AND SKULL: No acute soft tissue abnormality. No skull fracture. IMPRESSION: 1. No acute intracranial abnormality. 2. Remote left basal ganglia infarct and chronic microvascular ischemic change. Electronically signed by: Gilmore Molt MD 10/18/2024 03:31 AM EST RP Workstation: [id number]    Vitals:   10/18/24 9365 10/18/24 0634 10/18/24 1005 10/18/24 1200  BP:   98/73 (!) 142/73  Pulse: 65   66  Resp: 16  16 16   Temp:  98.3 F (36.8 C) 98.3 F (36.8 C) 97.8 F (36.6 C)  TempSrc:  Oral  Oral  SpO2: 96%  96% 97%  Weight:      Height:         PHYSICAL EXAM General:  Alert, well-nourished, well-developed patient in no acute distress Psych:  Mood and affect appropriate for situation CV: Regular rate and rhythm on monitor Respiratory:  Regular, unlabored respirations on room air GI: Abdomen soft and nontender   NEURO:  Mental Status: AA&Ox3, patient is able to give clear and coherent history Speech/Language: speech is without dysarthria or aphasia.  Naming, repetition, fluency, and comprehension intact.  Cranial Nerves:  II: PERRL. Visual fields full.  III, IV, VI: EOMI. Eyelids  elevate symmetrically.  V: Sensation is intact to light touch and symmetrical to face.  VII: Face is symmetrical resting and smiling VIII: hearing intact to voice. IX, X: Palate elevates symmetrically. Phonation is normal.  KP:Dynloizm shrug 5/5. XII: tongue is midline without fasciculations. Motor: RUE  4+/5 LUE 5/5 RLE  4+/5 LLE 5/5 Tone: is normal and bulk is normal Sensation- Intact to light touch bilaterally. Extinction absent to light touch to DSS.   Coordination: FTN intact bilaterally, HKS: no ataxia in BLE.No drift.  Gait- deferred  ASSESSMENT/PLAN  Ms. Sara [name] is a 78 y.o. female with history of HTN, HLD, DM2, PVD, s/p right femoral endarterectomy in 06/2024, hypothyroidism,  and obesity presented 12/6pm with right leg weakness and numbness. MRI brain was then obtained, revealing an acute ischemic stroke within the left pons, admitted for stroke workup.  NIH on Admission 0.  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 Remote left basal ganglia infarct and chronic microvascular ischemic change. CTA head & neck carotid bulbs with less than 20% stenosis bilaterally. Calcific plaque within the carotid siphons bilaterally and within the supraclinoid segments, with approximately 50% luminal stenosis. MRI  Acute nonhemorrhagic lacunar infarct in the left pons. Chronic encephalomalacia changes in the left basal ganglia and external capsule. Extensive periventricular and deep cerebral white matter disease. 2D Echo EF 55-60% LDL 44 HgbA1c 8.4 VTE prophylaxis - lovenox  aspirin  81 mg daily prior to admission, now on aspirin  81 mg daily and clopidogrel  75 mg daily for 3 weeks and then Plavix  alone. Duration of DAPT may change with CTA results.  Therapy recommendations:  Pending Disposition:  pending  Hypertension Home meds: Hyzaar 50-12.5mg  BID Stable Avoid low BP Long term BP goal  normotensive  Hyperlipidemia Home meds:  Lipitor 40mg , resumed LDL 44, goal < 70 Continue statin on discharge  Diabetes type II Uncontrolled Home meds:  Metformin  1g BID, Insulin  HgbA1c 8.4, goal < 7.0 On insulin  CBGs SSI Recommend close follow-up with PCP for better DM control  PVD S/p R femoral endarterectomy 06/2024 On ASA and lipitor 40 PTA  Other Stroke Risk Factors Obesity, Body mass index  is 37.66 kg/m., BMI >/= 30 associated with increased stroke risk, recommend weight loss, diet and exercise as appropriate  Silent strokes - Remote left basal ganglia ischemic infarct seen on this admission's imaging.  Former cigarette smoker   Hospital day # 0  Patient seen and examined by NP/APP with MD. MD to update note as needed.   Jorene Last, DNP, FNP-BC [facility] Pager: [phone]  ATTENDING NOTE: I reviewed above note and agree with the assessment and plan. Pt was seen and examined.   No family at the bedside. Pt sitting in chair for lunch, able to use right hand for utensils. Still has mild R hemiparesis, RUE pronator drift and RLE 4+/5. Slight R NLF flattening. No other significant deficit. Pending PT and OT. Recommend DAPT for 3 weeks and then plavix  alone. Continue statin.   For detailed assessment and plan, please refer to above as I have made changes wherever appropriate.   Neurology will sign off. Please call with questions. Pt will follow up with stroke clinic NP at [facility] in about 4 weeks. Thanks for the consult.   Ary Cummins, MD PhD Stroke Neurology 10/19/2024 12:54 PM    To contact Stroke Continuity provider, please refer to [email or web address]. After hours, contact General Neurology

## 2024-10-18 NOTE — Consult Note (Signed)
 NEUROLOGY CONSULT NOTE   Date of service: October 18, 2024 Patient Name: Sara [name] MRN:  [id number] DOB:  06-Aug-1946 Chief Complaint: Right leg numbness and balance problems Requesting Provider: Theadore Ozell HERO, MD  History of Present Illness  Sara [name] is a 78 y.o. female with a PMHx of anemia, bilateral cataracts, retinal branch occlusion, cerebral infarction, CKD2, DM, DOE, HTN, HLD, obesity and PVD s/p right femoral endarterectomyx who presented to the ED on Saturday night with new onset of right foot numbness and losing her balance since Monday. In the context of being a somewhat difficult historian, she also stated that her right arm was numb, but later contradicted herself. She denied any facial numbness. EDP exam revealed normal RUE strength, but RLE did exhibit weakness in addition to decreased sensation. Differential diagnosis by ED provider included effects of PVD on her RLE given her recent right femoral endarterectomy in August. CT runoff study showed diffuse peripheral artery disease of the leg as well as within the abdomen, but no urgent findings, failing to explain her new RLE weakness. MRI brain was then obtained, revealing an acute ischemic stroke within the left pons. Neurology has been consulted for stroke evaluation.  Home medications include ASA and atorvastatin .    ROS  As per HPI.    Past History   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/2022   DM (diabetes mellitus) (HCC)    DOE (dyspnea on exertion) 05/24/2022   Essential hypertension 04/25/2022   Hyperglycemia due to  type 2 diabetes mellitus (HCC) 05/24/2022   Hypertension with renal disease    Hypothyroidism    Long term (current) use of insulin  (HCC)    Menopause 05/24/2022   Mixed hyperlipidemia    Morbid obesity due to excess calories (HCC)    Obesity (BMI 35.0-39.9 without comorbidity) 05/24/2022   Postmenopausal bleeding 05/30/2022   PVD (peripheral vascular disease)    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) 05/24/2022   Stroke (HCC) 05/24/2022   Transient arterial retinal occlusion 05/24/2022   Type 2 diabetes mellitus with hyperglycemia (HCC)    Type 2 diabetes mellitus with other specified complication (HCC) 05/24/2022   Type 2 diabetes mellitus with peripheral angiopathy (HCC) 05/24/2022  DM, DOE, HTN, HLD, obesity, PVD s/p right femoral endarterectomy  Past Surgical History:  Procedure Laterality Date   APPENDECTOMY     as a teenager   CATARACT EXTRACTION W/ INTRAOCULAR LENS IMPLANT Bilateral    CESAREAN SECTION  1976   CESAREAN SECTION  1979   COLONOSCOPY     ENDARTERECTOMY FEMORAL Right 06/16/2024   Procedure: RIGHT FEMORAL ENDARTERECTOMY;  Surgeon: Pearline Norman RAMAN, MD;  Location: [facility] OR;  Service: Vascular;  Laterality: Right;   PATCH ANGIOPLASTY Right 06/16/2024   Procedure: ANGIOPLASTY, USING GEORGE BIOLOGIC PATCH;  Surgeon: Pearline Norman RAMAN, MD;  Location: [facility] OR;  Service: Vascular;  Laterality: Right;   polypectomy  2023   Uterine   TUBAL LIGATION  1979   with C-Section    Family History: Family History  Problem Relation Age of Onset   Heart disease Mother    Diabetes Brother    Diabetes Maternal Uncle    Colon cancer Neg Hx    Esophageal cancer Neg Hx    Stomach cancer Neg Hx    Rectal cancer Neg Hx     Social History  reports that she quit smoking about 20 years ago. Her smoking use included cigarettes. She has never used smokeless tobacco. She reports current alcohol use. She reports  that she does not use drugs.  Allergies  Allergen Reactions   Other     Eggplant causes extreme fatigue    Medications   Current Facility-Administered Medications:    potassium chloride  10 mEq in 100 mL IVPB, 10 mEq, Intravenous, Q1 Hr x 2, Sara [name], Subrina, MD  Current Outpatient Medications:    aspirin  EC 81 MG tablet, Take 81 mg by mouth daily. Swallow whole., Disp: , Rfl:    atorvastatin  (LIPITOR) 40 MG tablet, Take 40 mg by mouth daily., Disp: , Rfl:    EUTHYROX  50 MCG tablet, Take 50 mcg by mouth daily before breakfast., Disp: , Rfl:    insulin  lispro protamine -lispro (HUMALOG  MIX 75/25) (75-25) 100 UNIT/ML SUSP injection, Inject 0.15 mLs (15 Units total) into the skin daily with breakfast., Disp: 10 mL, Rfl: 11   insulin  NPH-regular Human (HUMULIN  70/30) (70-30) 100 UNIT/ML injection, Inject 30 units into the skin 2(two) times daily with a meal. 30 units in the morning and 50 units at night, Disp: 10 mL, Rfl: 11   losartan -hydrochlorothiazide  (HYZAAR) 50-12.5 MG tablet, Take 2 tablets by mouth daily., Disp: , Rfl:    meclizine  (ANTIVERT ) 12.5 MG tablet, Take 1 tablet (12.5 mg total) by mouth 3 (three) times daily as needed for dizziness., Disp: 30 tablet, Rfl: 0   metFORMIN  (GLUCOPHAGE ) 1000 MG tablet, Take 1,000 mg by mouth 2 (two) times daily with a meal., Disp: , Rfl:    oxyCODONE -acetaminophen  (PERCOCET/ROXICET) 5-325 MG tablet, Take 1 tablet by mouth every 6 (six) hours as needed for severe pain (pain score 7-10). (Patient not taking: Reported on 08/20/2024), Disp: 20 tablet, Rfl: 0  Vitals   Vitals:   10/18/24 0235 10/18/24 0632 10/18/24 0634 10/18/24 0634  BP: (!) 142/75 (!) 178/71    Pulse: 66 65 65   Resp: 16 16 16    Temp:    98.3 F (36.8 C)  TempSrc:    Oral  SpO2: 100% 96% 96%   Weight:      Height:        Body mass index is 37.66 kg/m.   Physical Exam   Constitutional: Appears well-developed and well-nourished for her age.  Psych: Affect appropriate to  situation.  Eyes: No scleral injection.  HENT: No OP obstruction.  Head: Normocephalic.  Respiratory: Effort normal, non-labored breathing.  Skin: No rash Ext: No increased swelling or discoloration  Neurologic Examination   Mental Status: Alert, oriented x 5, thought content appropriate.  Speech fluent with intact naming of basic items in Spanish or English. Intact comprehension; able to follow all commands without difficulty. Cranial Nerves: II: Temporal visual fields intact with no extinction to DSS. PERRL  III,IV, VI: No ptosis. EOMI. No nystagmus V: Temp sensation equal bilaterally  VII: Smile symmetric VIII: Hearing intact to voice IX,X: No hoarseness XI: Symmetric shoulder shrug XII: Midline tongue extension Motor: RUE 4+/5 proximally and distally. Bobbing drift with eyes closed RLE with some weakness of hip flexion - has difficulty elevating leg at hip  fully off bed. Knee extension 4+/5.  LUE 5/5 LLE 5/5 HF and KE  Sensory: Normal temp sensation to BUE. Decreased temp sensation to RLE, normal to LLE. No extinction to DSS.  Deep Tendon Reflexes: 1+ bilateral brachioradialis and biceps. Unable to elicit patellar reflexes.  Cerebellar: No ataxia with FNF bilaterally  Gait: Deferred  Labs/Imaging/Neurodiagnostic studies   CBC:  Recent Labs  Lab 11-Nov-2024 2210  WBC 8.4  HGB 13.1  HCT 41.2  MCV 86.2  PLT 259   Basic Metabolic Panel:  Lab Results  Component Value Date   NA 137 November 11, 2024   K 3.4 (L) 11/11/2024   CO2 30 2024/11/11   GLUCOSE 180 (H) November 11, 2024   BUN 20 11-11-2024   CREATININE 0.86 November 11, 2024   CALCIUM  9.0 2024-11-11   GFRNONAA >60 11-11-24   Lipid Panel:  Lab Results  Component Value Date   LDLCALC 32 06/17/2024   HgbA1c:  Lab Results  Component Value Date   HGBA1C 9.0 (H) 06/16/2024   Urine Drug Screen: No results found for: LABOPIA, COCAINSCRNUR, LABBENZ, AMPHETMU, THCU, LABBARB  Alcohol Level No results found for:  [facility] INR  Lab Results  Component Value Date   INR 1.0 06/10/2024   APTT  Lab Results  Component Value Date   APTT 32 06/10/2024     ASSESSMENT  Akima A Hilger is a 78 y.o. female presenting with new onset of RUE and RLE weakness. Has had RLE numbness since Monday. MRI brain reveals an acute left pontine stroke.  - Exam reveals mild RUE and RLE weakness in conjunction with RLE sensory numbness.  - MRI brain: Acute nonhemorrhagic lacunar infarct in the left pons. Chronic encephalomalacia changes in the left basal ganglia and external capsule, appearing most consistent with a prior ischemic infarction. Extensive periventricular and deep cerebral white matter disease also noted. - Labs: Potassium 3.4 and glucose 180. Normal BUN and Cr. Normal LFTs. Normal WBC, HCT and platelets. U/A normal.  - Impression:  - Acute left pontine ischemic stroke. 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  - Remote left basal ganglia ischemic infarction.   RECOMMENDATIONS  1. HgbA1c, fasting lipid panel 2. CTA of head and neck 3. PT consult, OT consult, Speech consult 4. Echocardiogram 5. Continue her home atorvastatin  6. Add Plavix  to her ASA. Continue DAPT regimen indefinitely as she has failed ASA monotherapy.  7. Risk factor modification 8. Telemetry monitoring 9. Frequent neuro checks 10. NPO until passes stroke swallow screen 11. BP management per standard protocol. Out of the permissive HTN time window.  12. Glycemic control 13. Stroke Team to follow   ______________________________________________________________________    Bonney SHARK, Zaniya Mcaulay, MD [facility]

## 2024-10-18 NOTE — ED Provider Notes (Addendum)
 [facility] DEPT [facility] Emergency Department Provider Note MRN:  [id number]  Arrival date & time: 10/18/24     Chief Complaint   Numbness   History of Present Illness   Sara [name] is a 78 y.o. year-old female with a history of diabetes, CKD, peripheral vascular disease presenting to the ED with chief complaint of numbness.  Patient endorsing decreased sensation to the right lower leg.  Had a vascular surgery on this leg few months ago.  Denies any pain.  Symptoms started about 4 days ago.  Having significant difficulty with walking during this time.  Review of Systems  A thorough review of systems was obtained and all systems are negative except as noted in the HPI and PMH.   Patient's Health History    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/2022   DM (diabetes mellitus) (HCC)    DOE (dyspnea on exertion) 05/24/2022   Essential hypertension 04/25/2022   Hyperglycemia due to type 2 diabetes mellitus (HCC) 05/24/2022   Hypertension with renal disease    Hypothyroidism    Long term (current) use of insulin  (HCC)    Menopause 05/24/2022   Mixed hyperlipidemia    Morbid obesity due to excess calories (HCC)    Obesity (BMI 35.0-39.9 without comorbidity) 05/24/2022   Postmenopausal bleeding 05/30/2022   PVD (peripheral vascular disease)    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) 05/24/2022   Stroke (HCC) 05/24/2022   Transient arterial retinal occlusion 05/24/2022   Type 2 diabetes mellitus with hyperglycemia  (HCC)    Type 2 diabetes mellitus with other specified complication (HCC) 05/24/2022   Type 2 diabetes mellitus with peripheral angiopathy (HCC) 05/24/2022    Past Surgical History:  Procedure Laterality Date   APPENDECTOMY     as a teenager   CATARACT EXTRACTION W/ INTRAOCULAR LENS IMPLANT Bilateral    CESAREAN SECTION  1976   CESAREAN SECTION  1979   COLONOSCOPY     ENDARTERECTOMY FEMORAL Right 06/16/2024   Procedure: RIGHT FEMORAL ENDARTERECTOMY;  Surgeon: Pearline Norman RAMAN, MD;  Location: [facility] OR;  Service: Vascular;  Laterality: Right;   PATCH ANGIOPLASTY Right 06/16/2024   Procedure: ANGIOPLASTY, USING GEORGE BIOLOGIC PATCH;  Surgeon: Pearline Norman RAMAN, MD;  Location: [facility] OR;  Service: Vascular;  Laterality: Right;   polypectomy  2023   Uterine   TUBAL LIGATION  1979   with C-Section    Family History  Problem Relation Age of Onset   Heart disease Mother    Diabetes Brother    Diabetes Maternal Uncle    Colon cancer Neg Hx    Esophageal cancer Neg Hx    Stomach cancer Neg Hx    Rectal cancer Neg Hx     Social History   Socioeconomic History   Marital status: Single    Spouse name: Not on file   Number of children: 2   Years of education: Not on file   Highest education level: Not on file  Occupational History   Occupation: retired  Tobacco Use  Smoking status: Former    Current packs/day: 0.00    Types: Cigarettes    Quit date: 10/01/2004    Years since quitting: 20.0   Smokeless tobacco: Never  Vaping Use   Vaping status: Never Used  Substance and Sexual Activity   Alcohol use: Yes    Comment: occasional   Drug use: Never   Sexual activity: Not Currently    Birth control/protection: Post-menopausal  Other Topics Concern   Not on file  Social History Narrative   Not on file   Social Drivers of Health   Financial Resource Strain: Not on file  Food Insecurity: Food Insecurity Present (06/16/2024)   Hunger Vital Sign    Worried About Running Out of Food in  the Last Year: Sometimes true    Ran Out of Food in the Last Year: Sometimes true  Transportation Needs: No Transportation Needs (06/16/2024)   PRAPARE - Administrator, Civil Service (Medical): No    Lack of Transportation (Non-Medical): No  Physical Activity: Not on file  Stress: Not on file  Social Connections: Moderately Isolated (06/16/2024)   Social Connection and Isolation Panel    Frequency of Communication with Friends and Family: Three times a week    Frequency of Social Gatherings with Friends and Family: Three times a week    Attends Religious Services: Never    Active Member of Clubs or Organizations: No    Attends Banker Meetings: Never    Marital Status: Married  Catering Manager Violence: Not At Risk (06/16/2024)   Humiliation, Afraid, Rape, and Kick questionnaire    Fear of Current or Ex-Partner: No    Emotionally Abused: No    Physically Abused: No    Sexually Abused: No     Physical Exam   Vitals:   10/17/24 2119 10/18/24 0235  BP: (!) 153/81 (!) 142/75  Pulse: 66 66  Resp: 16 16  Temp: 98.4 F (36.9 C)   SpO2: 95% 100%    CONSTITUTIONAL: Well-appearing, NAD NEURO/PSYCH:  Alert and oriented x 3, decree strength and sensation to right lower extremity EYES:  eyes equal and reactive ENT/NECK:  no LAD, no JVD CARDIO: Regular rate, well-perfused, normal S1 and S2 PULM:  CTAB no wheezing or rhonchi GI/GU:  non-distended, non-tender MSK/SPINE:  No gross deformities, no edema SKIN:  no rash, atraumatic   *Additional and/or pertinent findings included in MDM below  Diagnostic and Interventional Summary    EKG Interpretation Date/Time:    Ventricular Rate:    PR Interval:    QRS Duration:    QT Interval:    QTC Calculation:   R Axis:      Text Interpretation:         Labs Reviewed  COMPREHENSIVE METABOLIC PANEL WITH GFR - Abnormal; Notable for the following components:      Result Value   Potassium 3.4 (*)    Chloride 95  (*)    Glucose, Bld 180 (*)    All other components within normal limits  CBC - Abnormal; Notable for the following components:   RDW 16.3 (*)    All other components within normal limits  URINALYSIS, ROUTINE W REFLEX MICROSCOPIC    MR BRAIN WO CONTRAST  Final Result    CT HEAD WO CONTRAST ( )  Final Result    CT Angio Aortobifemoral W and/or Wo Contrast  Final Result      Medications  iohexol  (OMNIPAQUE ) 350 MG/ML injection 100 mL (100 mLs  Intravenous Contrast Given 10/18/24 0323)     Procedures  /  Critical Care .Critical Care  Performed by: Theadore Ozell HERO, MD Authorized by: Theadore Ozell HERO, MD   Critical care provider statement:    Critical care time (minutes):  35   Critical care was necessary to treat or prevent imminent or life-threatening deterioration of the following conditions:  CNS failure or compromise   Critical care was time spent personally by me on the following activities:  Development of treatment plan with patient or surrogate, discussions with consultants, evaluation of patient's response to treatment, examination of patient, ordering and review of laboratory studies, ordering and review of radiographic studies, ordering and performing treatments and interventions, pulse oximetry, re-evaluation of patient's condition and review of old charts   ED Course and Medical Decision Making  Initial Impression and Ddx Patient may be experiencing some arterial insufficiency to the right lower extremity given the symptoms.  Not really having any pain in the foot feels warm however.  A bit of a difficult historian.  At times she says that her right arm is also numb but then she contradicts herself.  Not having any facial numbness.  Does not seem to have any weakness to the right arm on exam.  The right leg does exhibit weakness on exam as well as decreased sensation.  Awaiting CT imaging.  Past medical/surgical history that increases complexity of ED encounter:  PAD  Interpretation of Diagnostics I personally reviewed the Laboratory Testing and my interpretation is as follows: No significant blood count or electrolyte disturbance.  CT runoff study showing diffuse peripheral artery disease of the leg as well as within the abdomen, no urgent findings.  Patient Reassessment and Ultimate Disposition/Management     CT runoff study does not explain patient's right leg symptoms.  Proceeding with MRI brain.  MRI brain reveals acute ischemic stroke.  Consulting neurology, plan is for hospitalist admission.  Patient management required discussion with the following services or consulting groups:  None  Complexity of Problems Addressed Acute illness or injury that poses threat of life of bodily function  Additional Data Reviewed and Analyzed Further history obtained from: Further history from spouse/family member  Additional Factors Impacting ED Encounter Risk Consideration of hospitalization  Ozell HERO. Theadore, MD [facility] Emergency Medicine [facility] [email or web address]  Final Clinical Impressions(s) / ED Diagnoses     ICD-10-CM   1. Weakness of right lower extremity  R29.898     2. Acute ischemic stroke [facility])  I63.9       ED Discharge Orders     None        Discharge Instructions Discussed with and Provided to Patient:   Discharge Instructions   None      Theadore Ozell HERO, MD 10/18/24 0515    Theadore Ozell HERO, MD 10/18/24 [phone]

## 2024-10-18 NOTE — Plan of Care (Signed)

## 2024-10-18 NOTE — Hospital Course (Signed)
   78-yo female history of htn, insulin -dependent DM type II, CKD 2, peripheral vascular disease and hypothyroidism presenting to emergency department complaining of decreased sensation of the left lower leg with associated numbness.  Patient has been seeing vascular surgery few months ago.  Reported that symptoms started 4 days ago having difficulty walking.  At presentation to ED patient found borderline hypertensive otherwise hemodynamically stable.  MRI of the brain showing acute nonhemorrhagic infarction of the left pons. Chronic encephalomalacia changes in the left basal ganglia and external capsule. Extensive periventricular and deep cerebral white matter disease.  CT head no acute intracranial abnormality.Remote left basal ganglia infarct and chronic microvascular ischemic change.   CT angio bifemoral: Extensive chronic finding without any acute obstruction.  Bilateral lower extremities blood vessels are patent however there is high-grade stenosis.  Please review the angiogram for detail information.  Lab, CBC unremarkable.  CMP showing low potassium 3.4 otherwise unremarkable.  UA unremarkable.  EDP consulted neurology pending recommendation. At home patient takes aspirin  81 mg daily.  Recommended EDP to give aspirin  325mg  and follow-up with neurology recommendation.  Hospitalist consulted for further evaluation management of acute ischemic CVA and hypokalemia.

## 2024-10-18 NOTE — H&P (Addendum)
 History and Physical    Patient: Sara [name] FMW:[id number] DOB: October 21, 1946 DOA: 10/17/2024 DOS: the patient was seen and examined on 10/18/2024 PCP: Melonie Colonel, Mikel HERO, MD  Patient coming from: Home  Chief Complaint:  Chief Complaint  Patient presents with   Numbness   HPI: Sara [name] is a 78 y.o. female with medical history significant of hypertension, hyperlipidemia, peripheral vascular disease s/p right femoral endarterectomy in 06/2024, diabetes mellitus type 2, hypothyroidism,  and obesity presents with right leg weakness and numbness.  She has been experiencing weakness and numbness in her right leg starting 6 days ago.  Does not recall waking up with the symptoms but noticing them as the day progressed.  This is the same leg on which she had an endarterectomy in August. Since the onset of symptoms, she has been having increased difficulty to walk and requiring more assistance to move around.  Patient notes using a walker at baseline. She feels like she might fall when not holding onto something, such as when brushing her teeth.  In addition to the leg symptoms, she has numbness in her right hand, affecting her ability to perform tasks like brushing her teeth. No speech difficulties or facial weakness are noted.  She has a history of breaking two toes on her left foot after her husband fell on her leg, for which she is currently in a special shoe.  Her medical history includes diabetes managed with insulin  therapy, taking 30 units in the morning and 50 units at night.  She believes her last hemoglobin A1c was 8, but is unsure. She does not check her blood sugar regularly.  No history of smoking or alcohol use. No recent heart palpitations or headaches. She has experienced changes in her vision, which began before the onset of her current symptoms. She missed her last ophthalmology appointment in August due to her hospitalization, but was unable to get rescheduled until  January of this coming year.  At home she cares for her husband who had brain surgery and is blind and falls often.  In the emergency department patient was noted to be afebrile with blood pressures elevated up to 178/71, and all other vital signs relatively maintained.  Labs 12/6 significant for potassium 3.4 and glucose 180.  Urinalysis did not show any signs for infection or any acute abnormality.  CT angiogram of the aortabifemoral noted diffuse peripheral artery disease of the abdomen and legs with no emergent findings.  Subsequently CT scan of the head was obtained which did not show any acute abnormality, but did note remote left basal ganglia infarct and chronic microvascular ischemic changes.  Subsequent MRI of the brain was obtained which revealed an acute nonhemorrhagic lacunar infarct in the left pons and chronic encephalomalacia changes in the left basal ganglia and external capsule.  Patient was not a candidate for thrombolytics due to being outside the window.  Patient was given potassium chloride  20 meq IV.  Neurology had formally seen and recommended admission for completion of stroke workup.  Review of Systems: As mentioned in the history of present illness. All other systems reviewed and are negative. 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/2022   Chronic kidney disease, stage 2 (mild) 05/24/2022  Chronic renal insufficiency, stage II (mild)    Diabetic renal disease (HCC) 05/24/2022   DM (diabetes mellitus) (HCC)    DOE (dyspnea on exertion) 05/24/2022   Essential hypertension 04/25/2022   Hyperglycemia due to type 2 diabetes mellitus (HCC) 05/24/2022   Hypertension with renal disease    Hypothyroidism    Long term (current) use of insulin   (HCC)    Menopause 05/24/2022   Mixed hyperlipidemia    Morbid obesity due to excess calories (HCC)    Obesity (BMI 35.0-39.9 without comorbidity) 05/24/2022   Postmenopausal bleeding 05/30/2022   PVD (peripheral vascular disease)    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) 05/24/2022   Stroke (HCC) 05/24/2022   Transient arterial retinal occlusion 05/24/2022   Type 2 diabetes mellitus with hyperglycemia (HCC)    Type 2 diabetes mellitus with other specified complication (HCC) 05/24/2022   Type 2 diabetes mellitus with peripheral angiopathy (HCC) 05/24/2022   Past Surgical History:  Procedure Laterality Date   APPENDECTOMY     as a teenager   CATARACT EXTRACTION W/ INTRAOCULAR LENS IMPLANT Bilateral    CESAREAN SECTION  1976   CESAREAN SECTION  1979   COLONOSCOPY     ENDARTERECTOMY FEMORAL Right 06/16/2024   Procedure: RIGHT FEMORAL ENDARTERECTOMY;  Surgeon: Pearline Norman RAMAN, MD;  Location: [facility] OR;  Service: Vascular;  Laterality: Right;   PATCH ANGIOPLASTY Right 06/16/2024   Procedure: ANGIOPLASTY, USING GEORGE BIOLOGIC PATCH;  Surgeon: Pearline Norman RAMAN, MD;  Location: [facility] OR;  Service: Vascular;  Laterality: Right;   polypectomy  2023   Uterine   TUBAL LIGATION  1979   with C-Section   Social History:  reports that she quit smoking about 20 years ago. Her smoking use included cigarettes. She has never used smokeless tobacco. She reports current alcohol use. She reports that she does not use drugs.  Allergies  Allergen Reactions   Other     Eggplant causes extreme fatigue    Family History  Problem Relation Age of Onset   Heart disease Mother    Diabetes Brother    Diabetes Maternal Uncle    Colon cancer Neg Hx    Esophageal cancer Neg Hx    Stomach cancer Neg Hx    Rectal cancer Neg Hx     Prior to Admission medications   Medication Sig Start Date End Date Taking? Authorizing Provider  aspirin   EC 81 MG tablet Take 81 mg by mouth daily. Swallow whole.    [provider]  atorvastatin  (LIPITOR) 40 MG tablet Take 40 mg by mouth daily. 05/20/24   [provider]  EUTHYROX  50 MCG tablet Take 50 mcg by mouth daily before breakfast. 06/15/19   [provider]  insulin  lispro protamine -lispro (HUMALOG  MIX 75/25) (75-25) 100 UNIT/ML SUSP injection Inject 0.15 mLs (15 Units total) into the skin daily with breakfast. 06/20/24   Baglia, Corrina, PA-C  insulin  NPH-regular Human (HUMULIN  70/30) (70-30) 100 UNIT/ML injection Inject 30 units into the skin 2(two) times daily with a meal. 30 units in the morning and 50 units at night 06/20/24   Baglia, Corrina, PA-C  losartan -hydrochlorothiazide  (HYZAAR) 50-12.5 MG tablet Take 2 tablets by mouth daily. 02/16/22   [provider]  meclizine  (ANTIVERT ) 12.5 MG tablet Take 1 tablet (12.5 mg total) by mouth 3 (three) times daily as needed for dizziness. 06/20/24   Sherrill Cable Latif, DO  metFORMIN  (GLUCOPHAGE ) 1000 MG tablet Take  1,000 mg by mouth 2 (two) times daily with a meal.    [provider]  oxyCODONE -acetaminophen  (PERCOCET/ROXICET) 5-325 MG tablet Take 1 tablet by mouth every 6 (six) hours as needed for severe pain (pain score 7-10). Patient not taking: Reported on 08/20/2024 06/20/24   Charlyne Reed, PA-C    Physical Exam: Vitals:   10/18/24 0235 10/18/24 0632 10/18/24 0634 10/18/24 0634  BP: (!) 142/75 (!) 178/71    Pulse: 66 65 65   Resp: 16 16 16    Temp:    98.3 F (36.8 C)  TempSrc:    Oral  SpO2: 100% 96% 96%   Weight:      Height:      Constitutional: Elderly female currently in no acute distress Eyes: PERRL, lids and conjunctivae normal ENMT: Mucous membranes are moist. Posterior pharynx clear of any exudate or lesions. Poor dentition with  missing teeth Neck: normal, supple Respiratory: clear to auscultation bilaterally, no wheezing, no crackles. Normal respiratory effort. No accessory muscle use.   Cardiovascular: Regular rate and rhythm,  No extremity edema. 2+ pedal pulses. Abdomen: no tenderness, no masses palpated. Bowel sounds positive.  Musculoskeletal: no clubbing / cyanosis. No joint deformity upper and lower extremities. Good ROM, no contractures.  Left foot in orthopedic walker boot Skin: no rashes, lesions, ulcers. No induration Neurologic: CN 2-12 grossly intact.  Sensation decreased on the left leg.  Strength 5/5 in the right upper and lower extremity.  Strength 4+ in the left leg Psychiatric: Normal judgment and insight. Alert and oriented x 3. Normal mood.   Data Reviewed:  EKG revealed normal sinus rhythm at 70 bpm with possible undetermined age anterior infarct.  Reviewed labs, imaging, and pertinent records as documented Assessment and Plan:  CVA Subacute.  Patient presented yesterday with a 5-day history of numbness in her right foot.  Prior history of right femoral endarterectomy in August.  Patient underwent CTA aortabifemoral which showed diffuse peripheral artery disease no urgent findings.  Further imaging was obtained including MRI of the brain which did reveal acute nonhemorrhagic lacunar infarct in the left pons and a remote left basal ganglia ischemic infarct. - Admit to a telemetry bed - Stroke order set initiated - Neuro checks - Check Hemoglobin A1c and lipid panel - Check echocardiogram - Check CT angiogram of the head and neck likely tomorrow as patient has already received contrast for today - PT/OT/Speech to evaluate and treat - ASA and Plavix  to be continued indefinitely - Follow-up telemetry - Appreciate neurology consultative services, will follow-up for any additional recommendations - Transitions of care to home  Essential hypertension On admission blood pressures elevated up to 178/71.  Home blood pressure regimen includes losartan -hydrochlorothiazide  100-25 mg p.o. daily. - Continue losartan  hydrochlorothiazide   Hypokalemia Acute.   Potassium noted to be 3.4.  Patient is on a diuretic which is likely a factor.  She had been given 20 mEq of potassium chloride  IV. - Continue to monitor and replace as needed  Uncontrolled diabetes mellitus type 2, with long-term use of insulin  On admission glucose noted to be 180.  Last available hemoglobin A1c noted to be 9 when checked back in 06/2024.  Home medication regimen includes Humalog  70/30 mix and metformin . - Hypoglycemic protocols - Follow-up hemoglobin A1c - Hold metformin  - CBGs before every meal with Humalog  70/30 mix 30 units every morning and 50 units evening per home regimen - Adjust as deemed medically appropriate  Hypothyroidism - Check TSH - Continue levothyroxine   Hyperlipidemia  Home medication regimen includes atorvastatin  40 mg daily. - Follow-up lipid panel - Goal LDL less than 70 - Continue atorvastatin  adjust dose at the medically appropriate  Peripheral vascular disease Patient with a history of right femoral endarterectomy in 06/2024. - Continue aspirin  and statin  Debility At baseline patient ambulates with the use of walker at baseline. - PT/OT  Obesity, class II BMI 37.66 kg/m  DVT prophylaxis: Lovenox  Advance Care Planning:   Code Status: Full Code   Consults: Neurology  Family Communication: Attempted to call number provided for daughter, but no answer and voicemail not set up.  Severity of Illness: The appropriate patient status for this patient is INPATIENT. Inpatient status is judged to be reasonable and necessary in order to provide the required intensity of service to ensure the patient's safety. The patient's presenting symptoms, physical exam findings, and initial radiographic and laboratory data in the context of their chronic comorbidities is felt to place them at high risk for further clinical deterioration. Furthermore, it is not anticipated that the patient will be medically stable for discharge from the hospital within 2  midnights of admission.   * I certify that at the point of admission it is my clinical judgment that the patient will require inpatient hospital care spanning beyond 2 midnights from the point of admission due to high intensity of service, high risk for further deterioration and high frequency of surveillance required.*  Author: Maximino DELENA Sharps, MD 10/18/2024 7:18 AM  For on call review [email or web address].

## 2024-10-18 NOTE — ED Notes (Signed)
 Patient transported to MRI

## 2024-10-19 ENCOUNTER — Inpatient Hospital Stay (HOSPITAL_COMMUNITY)

## 2024-10-19 LAB — GLUCOSE, CAPILLARY
Glucose-Capillary: 167 mg/dL — ABNORMAL HIGH (ref 70–99)
Glucose-Capillary: 189 mg/dL — ABNORMAL HIGH (ref 70–99)
Glucose-Capillary: 215 mg/dL — ABNORMAL HIGH (ref 70–99)
Glucose-Capillary: 229 mg/dL — ABNORMAL HIGH (ref 70–99)
Glucose-Capillary: 301 mg/dL — ABNORMAL HIGH (ref 70–99)
Glucose-Capillary: 38 mg/dL — CL (ref 70–99)
Glucose-Capillary: 88 mg/dL (ref 70–99)

## 2024-10-19 LAB — CBC
HCT: 39 % (ref 36.0–46.0)
Hemoglobin: 12.8 g/dL (ref 12.0–15.0)
MCH: 27.7 pg (ref 26.0–34.0)
MCHC: 32.8 g/dL (ref 30.0–36.0)
MCV: 84.4 fL (ref 80.0–100.0)
Platelets: 261 K/uL (ref 150–400)
RBC: 4.62 MIL/uL (ref 3.87–5.11)
RDW: 16.1 % — ABNORMAL HIGH (ref 11.5–15.5)
WBC: 8.1 K/uL (ref 4.0–10.5)
nRBC: 0 % (ref 0.0–0.2)

## 2024-10-19 LAB — BASIC METABOLIC PANEL WITH GFR
Anion gap: 7 (ref 5–15)
BUN: 15 mg/dL (ref 8–23)
CO2: 30 mmol/L (ref 22–32)
Calcium: 8.7 mg/dL — ABNORMAL LOW (ref 8.9–10.3)
Chloride: 105 mmol/L (ref 98–111)
Creatinine, Ser: 0.88 mg/dL (ref 0.44–1.00)
GFR, Estimated: >60 mL/min (ref >60)
Glucose, Bld: 39 mg/dL — CL (ref 70–99)
Potassium: 3.1 mmol/L — ABNORMAL LOW (ref 3.5–5.1)
Sodium: 142 mmol/L (ref 135–145)

## 2024-10-19 LAB — ECHOCARDIOGRAM COMPLETE
Area-P 1/2: 3.65 cm2
Calc EF: 57.5 %
Height: 62 in
P 1/2 time: 571 ms
S' Lateral: 2.9 cm
Single Plane A2C EF: 63.3 %
Single Plane A4C EF: 57.9 %
Weight: 3294.55 [oz_av]

## 2024-10-19 MED ORDER — INSULIN ASPART 100 UNIT/ML IJ SOLN: 0.0000 [IU] | Freq: Three times a day (TID) | INTRAMUSCULAR | Status: DC

## 2024-10-19 MED ORDER — IOHEXOL 350 MG/ML SOLN
75.0000 mL | Freq: Once | INTRAVENOUS | Status: AC | PRN
  Administered 2024-10-19: 75 mL via INTRAVENOUS

## 2024-10-19 MED ORDER — DEXTROSE 50 % IV SOLN
1.0000 | Freq: Once | INTRAVENOUS | Status: AC
  Administered 2024-10-19: 50 mL via INTRAVENOUS
  Filled 2024-10-19: qty 50

## 2024-10-19 MED ORDER — INSULIN ASPART 100 UNIT/ML IJ SOLN
0.0000 [IU] | Freq: Three times a day (TID) | INTRAMUSCULAR | Status: DC
  Administered 2024-10-19: 2 [IU] via SUBCUTANEOUS
  Administered 2024-10-19 – 2024-10-20 (×3): 3 [IU] via SUBCUTANEOUS
  Administered 2024-10-20: 1 [IU] via SUBCUTANEOUS
  Administered 2024-10-21: 3 [IU] via SUBCUTANEOUS
  Administered 2024-10-21: 2 [IU] via SUBCUTANEOUS
  Administered 2024-10-21 – 2024-10-22 (×2): 3 [IU] via SUBCUTANEOUS
  Administered 2024-10-22: 1 [IU] via SUBCUTANEOUS
  Administered 2024-10-22: 2 [IU] via SUBCUTANEOUS
  Administered 2024-10-23: 3 [IU] via SUBCUTANEOUS
  Administered 2024-10-23: 2 [IU] via SUBCUTANEOUS
  Filled 2024-10-19 (×2): qty 1
  Filled 2024-10-19 (×6): qty 3
  Filled 2024-10-19: qty 2
  Filled 2024-10-19: qty 3
  Filled 2024-10-19 (×2): qty 2

## 2024-10-19 NOTE — Plan of Care (Signed)
  Problem: Education: Goal: Knowledge of disease or condition will improve Outcome: Progressing Goal: Knowledge of secondary prevention will improve (MUST DOCUMENT ALL) Outcome: Progressing Goal: Knowledge of patient specific risk factors will improve (DELETE if not current risk factor) Outcome: Progressing   Problem: Ischemic Stroke/TIA Tissue Perfusion: Goal: Complications of ischemic stroke/TIA will be minimized Outcome: Progressing   Problem: Coping: Goal: Will verbalize positive feelings about self Outcome: Progressing Goal: Will identify appropriate support needs Outcome: Progressing   Problem: Health Behavior/Discharge Planning: Goal: Ability to manage health-related needs will improve Outcome: Progressing Goal: Goals will be collaboratively established with patient/family Outcome: Progressing   Problem: Self-Care: Goal: Ability to participate in self-care as condition permits will improve Outcome: Progressing

## 2024-10-19 NOTE — Evaluation (Signed)
 Physical Therapy Evaluation Patient Details Name: AVANI SENSABAUGH MRN: [id number] DOB: 1946/08/08 Today's Date: 10/19/2024  History of Present Illness  Pt is a 78 y.o. female presenting with RLE weakness and numbness. 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 PMH: HTN, HLD, PVD s/p R femoral endarterectomy 06/2024, DMII, hypothyroidism, obesity.  Clinical Impression  Pt tolerated treatment well today. Co-treat with OT. Pt today was able to ambulate in hallway with RW CGA and navigate stairs with Min A. Pt required max cues for safety and sequencing with RW. PTA pt was fully independent where she care takes for her blind husband. Patient will benefit from intensive inpatient follow-up therapy, >3 hours/day. PT will continue to follow.         If plan is discharge home, recommend the following: A little help with walking and/or transfers;A little help with bathing/dressing/bathroom;Assistance with cooking/housework;Assist for transportation;Help with stairs or ramp for entrance;Supervision due to cognitive status   Can travel by private vehicle        Equipment Recommendations Rolling walker (2 wheels)  Recommendations for Other Services  Rehab consult    Functional Status Assessment Patient has had a recent decline in their functional status and demonstrates the ability to make significant improvements in function in a reasonable and predictable amount of time.     Precautions / Restrictions Precautions Precautions: Fall Recall of Precautions/Restrictions: Intact Restrictions Weight Bearing Restrictions Per Provider Order: No      Mobility  Bed Mobility               General bed mobility comments: Recieved in bathroom with OT    Transfers Overall transfer level: Needs assistance Equipment used: Rolling walker (2 wheels) Transfers: Sit to/from Stand Sit to Stand: Contact guard assist           General transfer comment:  Cues for hand placement    Ambulation/Gait Ambulation/Gait assistance: Contact guard assist Gait Distance (Feet): 100 Feet Assistive device: Rolling walker (2 wheels) Gait Pattern/deviations: Trunk flexed, Decreased stride length, Step-through pattern, Drifts right/left Gait velocity: decreased     General Gait Details: Cues for proximity to RW as pt was noted to stand towards the R side of the RW.  Stairs Stairs: Yes Stairs assistance: Min assist Stair Management: Two rails, Step to pattern, Forwards Number of Stairs: 1 General stair comments: Cues for sequencing. Pt very slow to turn around on steps.  Wheelchair Mobility     Tilt Bed    Modified Rankin (Stroke Patients Only) Modified Rankin (Stroke Patients Only) Pre-Morbid Rankin Score: No symptoms Modified Rankin: No significant disability     Balance Overall balance assessment: Mild deficits observed, not formally tested                                           Pertinent Vitals/Pain Pain Assessment Pain Assessment: No/denies pain    Home Living Family/patient expects to be discharged to:: Private residence Living Arrangements: Spouse/significant other Available Help at Discharge: Family;Available 24 hours/day Type of Home: House Home Access: Stairs to enter Entrance Stairs-Rails: None Entrance Stairs-Number of Steps: 1   Home Layout: One level Home Equipment: Production Designer, Theatre/television/film (4 wheels) Additional Comments: Husband is blind pt reports is his primary caretaker, assists with all ADLs    Prior Function Prior Level of Function : Independent/Modified Independent;Driving  Mobility Comments: Pt reports Mod I rollator since last week. Prior to that she was Mod I furniture surfing ADLs Comments: Ind with most ADLs     Extremity/Trunk Assessment   Upper Extremity Assessment Upper Extremity Assessment: Defer to OT evaluation    Lower Extremity  Assessment Lower Extremity Assessment: RLE deficits/detail RLE Deficits / Details: R hip flexion 3/5    Cervical / Trunk Assessment Cervical / Trunk Assessment: Normal  Communication   Communication Communication: Other (comment);Impaired (Speeaks english fairly well however prefers interpreter) Factors Affecting Communication: Hearing impaired    Cognition Arousal: Alert Behavior During Therapy: WFL for tasks assessed/performed   PT - Cognitive impairments: Problem solving, Safety/Judgement, Sequencing, Initiation                       PT - Cognition Comments: Some slow processing noted however likely due to language barrier. Following commands: Impaired Following commands impaired: Follows one step commands inconsistently, Follows one step commands with increased time     Cueing Cueing Techniques: Verbal cues, Tactile cues     General Comments General comments (skin integrity, edema, etc.): VSS    Exercises     Assessment/Plan    PT Assessment Patient needs continued PT services  PT Problem List Decreased strength;Decreased activity tolerance;Decreased range of motion;Decreased balance;Decreased mobility;Decreased cognition;Decreased coordination;Decreased knowledge of use of DME;Decreased safety awareness;Decreased knowledge of precautions;Cardiopulmonary status limiting activity       PT Treatment Interventions DME instruction;Stair training;Gait training;Functional mobility training;Therapeutic activities;Therapeutic exercise;Balance training;Neuromuscular re-education;Cognitive remediation;Patient/family education    PT Goals (Current goals can be found in the Care Plan section)  Acute Rehab PT Goals Patient Stated Goal: to go home PT Goal Formulation: With patient Time For Goal Achievement: 11/02/24 Potential to Achieve Goals: Good    Frequency Min 2X/week     Co-evaluation               AM-PAC PT 6 Clicks Mobility  Outcome Measure Help  needed turning from your back to your side while in a flat bed without using bedrails?: A Little Help needed moving from lying on your back to sitting on the side of a flat bed without using bedrails?: A Little Help needed moving to and from a bed to a chair (including a wheelchair)?: A Little Help needed standing up from a chair using your arms (e.g., wheelchair or bedside chair)?: A Little Help needed to walk in hospital room?: A Little Help needed climbing 3-5 steps with a railing? : A Little 6 Click Score: 18    End of Session Equipment Utilized During Treatment: Gait belt Activity Tolerance: Patient tolerated treatment well Patient left: in chair;with call bell/phone within reach;with chair alarm set Nurse Communication: Mobility status PT Visit Diagnosis: Other abnormalities of gait and mobility (R26.89)    Time: 1330-1407 PT Time Calculation (min) (ACUTE ONLY): 37 min   Charges:   PT Evaluation $PT Eval Moderate Complexity: 1 Mod PT Treatments $Gait Training: 8-22 mins PT General Charges $$ ACUTE PT VISIT: 1 Visit         Sueellen NOVAK, PT, DPT Acute Rehab Services [phone]   Elesa Garman 10/19/2024, 4:24 PM

## 2024-10-19 NOTE — Progress Notes (Signed)
  Echocardiogram 2D Echocardiogram has been performed.  Koleen KANDICE Popper, RDCS 10/19/2024, 9:52 AM

## 2024-10-19 NOTE — Plan of Care (Signed)

## 2024-10-19 NOTE — Inpatient Diabetes Management (Signed)
 Inpatient Diabetes Program Recommendations  AACE/ADA: New Consensus Statement on Inpatient Glycemic Control (2015)  Target Ranges:  Prepandial:   less than 140 mg/dL      Peak postprandial:   less than 180 mg/dL (1-2 hours)      Critically ill patients:  140 - 180 mg/dL   Lab Results  Component Value Date   GLUCAP 167 (H) 10/19/2024   HGBA1C 8.4 (H) 10/18/2024    Review of Glycemic Control  Latest Reference Range & Units 10/18/24 16:58 10/18/24 22:30 10/19/24 00:17 10/19/24 05:26 10/19/24 05:58  Glucose-Capillary 70 - 99 mg/dL 869 (H) 895 (H) 88 38 (LL) 167 (H)   Diabetes history: DM 2 Outpatient Diabetes medications:  Mettformin 1000 mg bid 70/30 30 units q AM and 50 units q PM (also has Humalog  75/25 listed??) Current orders for Inpatient glycemic control:  Novolog  70/30 mix - 30 units q AM and 50 units q PM Inpatient Diabetes Program Recommendations:    Note hypoglycemia.  While in the hospital, consider d/c of Novolog  70/30 and add basal/ bolus instead.  Consider Semglee  16 units daily plus Novolog  4 units tid with meals (hold if patient eats less than 50% or NPO). Also please add Novolog  sensitive correction.   Thanks,  Randall Bullocks, RN, BC-ADM Inpatient Diabetes Coordinator Pager [phone]  (8a-5p)

## 2024-10-19 NOTE — Hospital Course (Signed)
 78 y.o. female with medical history significant of hypertension, hyperlipidemia, peripheral vascular disease s/p right femoral endarterectomy in 06/2024, diabetes mellitus type 2, hypothyroidism,  and obesity presents with right leg weakness and numbness.    Assessment and Plan:   Acute pontine CVA - Presented with right sided weakness which appears to have mostly resolved.  MRI noting acute nonhemorrhagic lacunar infarct in the left pons.  Neurology following closely.  Recommending DAPT with aspirin  and Plavix  for 3 weeks then transition to Plavix  daily thereafter.  Controllable risk factor reduction (hypertension, hyperlipidemia, diabetes mellitus, weight loss).  CT angio noting less than 20% stenosis bilaterally with no other frank occlusion or stenosis noted.  Echo noting no PFO.  pending eval by PT/OT.  Patient herself is very motivated to be discharged.  Anticipate discharge later today or tomorrow.   Uncontrolled insulin -dependent diabetes mellitus - A1c greater than 8 suggesting poor control.  Compliance makes.  Current home regimen includes 70/30 and metformin .  Would recommend patient continue to check/monitor her glucose closely.  May need to follow-up with PCP/endocrinology to titrate home insulin  regimen.  Recheck A1c in 1-3 months.   Hypertension - Resume losartan /hydrochlorothiazide .   Hyperlipidemia - Atorvastatin  on board.

## 2024-10-19 NOTE — Progress Notes (Signed)
 OT Cancellation Note  Patient Details Name: Sara [name] MRN: [id number] DOB: 03/10/1946   Cancelled Treatment:    Reason Eval/Treat Not Completed: Patient at procedure or test/ unavailable;Other (comment) (interpreter coming, but pt having ECHO; interpreter not available again until 130; will follow up as schedule allows and pt available.)  Christoper Bushey D Walton 10/19/2024, 9:39 AM

## 2024-10-19 NOTE — Progress Notes (Signed)
 PT Cancellation Note  Patient Details Name: Sara [name] MRN: [id number] DOB: [birth year]   Cancelled Treatment:    Reason Eval/Treat Not Completed: Patient at procedure or test/unavailable (Pt getting echo. Will follow up later if time allows.)   Geneal Huebert 10/19/2024, 9:37 AM

## 2024-10-19 NOTE — TOC Initial Note (Signed)
 Transition of Care Sara [name]) - Initial/Assessment Note    Patient Details  Name: Sara [name] MRN: [id number] Date of Birth: 02-28-1946  Transition of Care Sara [name]) CM/SW Contact:    Sara JULIANNA George, RN Phone Number: 10/19/2024, 1:19 PM  Clinical Narrative:                 Sara [name] is a 78 y.o. female with medical history significant of hypertension, hyperlipidemia, peripheral vascular disease s/p right femoral endarterectomy in 06/2024, diabetes mellitus type 2, hypothyroidism,  and obesity presents with right leg weakness and numbness.   Pt is from home with her spouse. Spouse is with her most of the time. Pt does the driving but daughter can assist after d/c.  Pt manages her own medications. No DME.   Awaiting therapy evals.  IP Care management following.  Expected Discharge Plan:  (TBD) Barriers to Discharge: Continued Medical Work up   Patient Goals and CMS Choice            Expected Discharge Plan and Services   Discharge Planning Services: CM Consult   Living arrangements for the past 2 months: Apartment                                      Prior Living Arrangements/Services Living arrangements for the past 2 months: Apartment Lives with:: Spouse Patient language and need for interpreter reviewed:: Yes (spanish speaking but understands English well) Do you feel safe going back to the place where you live?: Yes        Care giver support system in place?: Yes (comment)   Criminal Activity/Legal Involvement Pertinent to Current Situation/Hospitalization: No - Comment as needed  Activities of Daily Living   ADL Screening (condition at time of admission) Independently performs ADLs?: No Does the patient have a NEW difficulty with bathing/dressing/toileting/self-feeding that is expected to last >3 days?: Yes (Initiates electronic notice to provider for possible OT consult) Does the patient have a NEW difficulty with getting in/out of bed,  walking, or climbing stairs that is expected to last >3 days?: Yes (Initiates electronic notice to provider for possible PT consult) Does the patient have a NEW difficulty with communication that is expected to last >3 days?: No Is the patient deaf or have difficulty hearing?: No Does the patient have difficulty seeing, even when wearing glasses/contacts?: No Does the patient have difficulty concentrating, remembering, or making decisions?: No  Permission Sought/Granted                  Emotional Assessment Appearance:: Appears stated age Attitude/Demeanor/Rapport: Engaged Affect (typically observed): Accepting Orientation: : Oriented to Self, Oriented to Place, Oriented to  Time, Oriented to Situation   Psych Involvement: No (comment)  Admission diagnosis:  CVA (cerebral vascular accident) (HCC) [I63.9] Acute ischemic stroke (HCC) [I63.9] Weakness of right lower extremity [R29.898] Patient Active Problem List   Diagnosis Date Noted   CVA (cerebral vascular accident) (HCC) 10/18/2024   Hypokalemia 10/18/2024   Debility 10/18/2024   Obesity, Class II, BMI 35-39.9 10/18/2024   Claudication in peripheral vascular disease 06/16/2024   Critical lower limb ischemia (HCC) 06/16/2024   Postmenopausal bleeding 05/30/2022   Cerebral infarction (HCC) 05/24/2022   Menopause 05/24/2022   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) 05/24/2022   Stroke (HCC) 05/24/2022   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   Transient arterial retinal occlusion 05/24/2022   Chronic kidney disease, stage 2 (mild) 05/24/2022   Obesity (BMI 35.0-39.9 without comorbidity) 05/24/2022   Cardiac murmur 05/24/2022   Anemia 04/25/2022   Hypertension with renal disease 04/25/2022   Hypothyroidism 04/25/2022   Long term (current) use of insulin  (HCC) 04/25/2022   Mixed hyperlipidemia 04/25/2022   PVD (peripheral vascular  disease) 04/25/2022   Type 2 diabetes mellitus with hyperglycemia (HCC) 04/25/2022   Cataract 2016   PCP:  Sara [name], Sara HERO, MD Pharmacy:   [facility] [address], [place] - [address] [place] [address] Phone: [phone] Fax: [phone]  [name] [address] [place] [address] Phone: [phone] Fax: [phone]     Social Drivers of Health (SDOH) Social History: SDOH Screenings   Food Insecurity: Food Insecurity Present (10/18/2024)  Housing: High Risk (10/18/2024)  Transportation Needs: Unmet Transportation Needs (10/18/2024)  Utilities: Not At Risk (10/18/2024)  Social Connections: Moderately Isolated (10/18/2024)  Tobacco Use: Medium Risk (10/17/2024)   SDOH Interventions:     Readmission Risk Interventions     No data to display

## 2024-10-19 NOTE — Progress Notes (Signed)
 Progress Note   Patient: Sara [name] FMW:[id number] DOB: [birth year] DOA: 10/17/2024  DOS: the patient was seen and examined on 10/19/2024   Brief hospital course:  78 y.o. female with medical history significant of hypertension, hyperlipidemia, peripheral vascular disease s/p right femoral endarterectomy in 06/2024, diabetes mellitus type 2, hypothyroidism,  and obesity presents with right leg weakness and numbness.   Assessment and Plan:  Acute pontine CVA - Presented with right sided weakness which appears to have mostly resolved.  MRI noting acute nonhemorrhagic lacunar infarct in the left pons.  Neurology following closely.  Recommending DAPT with aspirin  and Plavix  for 3 weeks then transition to Plavix  daily thereafter.  Controllable risk factor reduction (hypertension, hyperlipidemia, diabetes mellitus, weight loss).  CT angio noting less than 20% stenosis bilaterally with no other frank occlusion or stenosis noted.  Echo noting no PFO.  pending eval by PT/OT.  Patient herself is very motivated to be discharged.  Anticipate discharge later today or tomorrow.  Uncontrolled insulin -dependent diabetes mellitus - A1c greater than 8 suggesting poor control.  Compliance makes.  Current home regimen includes 70/30 and metformin .  Would recommend patient continue to check/monitor her glucose closely.  May need to follow-up with PCP/endocrinology to titrate home insulin  regimen.  Recheck A1c in 1-3 months.  Hypertension - Resume losartan /hydrochlorothiazide .  Hyperlipidemia - Atorvastatin  on board.   Subjective: Patient sitting up this morning, ambulating with walker.  Denies any worsening weakness, fever, chills, chest pain, nausea, vomiting, abdominal pain.  Eager to be discharged home as she states she is feeling better.  Physical Exam:  Vitals:   10/18/24 1946 10/19/24 0007 10/19/24 0500 10/19/24 0822  BP: (!) 129/59 (!) 158/64 (!) 155/63 (!) 172/60  Pulse: 71 63 60 (!) 58  Resp:  18 16 18 18   Temp:  98.7 F (37.1 C) 98.5 F (36.9 C) 97.6 F (36.4 C)  TempSrc:  Oral Oral   SpO2: 92% 97% 94% 95%  Weight:      Height:        GENERAL:  Alert, pleasant, no acute distress  HEENT:  EOMI, mild left facial droop CARDIOVASCULAR:  RRR, no murmurs appreciated RESPIRATORY:  Clear to auscultation, no wheezing, rales, or rhonchi GASTROINTESTINAL:  Soft, nontender, nondistended EXTREMITIES:  No LE edema bilaterally NEURO:  No new focal deficits appreciated SKIN:  No rashes noted PSYCH:  Appropriate mood and affect     Data Reviewed:  Imaging Studies: CT ANGIO HEAD NECK W WO CM Result Date: 10/19/2024 EXAM: CTA HEAD AND NECK WITHOUT AND WITH 10/19/2024 08:40:56 AM TECHNIQUE: CTA of the head and neck was performed without and with the administration of 75 mL of iohexol  (OMNIPAQUE ) 350 MG/ML injection. Multiplanar 2D and/or 3D reformatted images are provided for review. Automated exposure control, iterative reconstruction, and/or weight based adjustment of the mA/kV was utilized to reduce the radiation dose to as low as reasonably achievable. Stenosis of the internal carotid arteries measured using NASCET criteria. COMPARISON: CT of the head dated 10/18/2024. CLINICAL HISTORY: Stroke/TIA, determine embolic source. FINDINGS: CTA NECK: AORTIC ARCH AND ARCH VESSELS: Mild calcific plaque within the aortic arch. No dissection or arterial injury. No significant stenosis of the brachiocephalic or subclavian arteries. CERVICAL CAROTID ARTERIES: Mild calcific plaque within the carotid bulbs and origins of the internal carotid arteries, with less than 20% luminal stenosis bilaterally. No dissection or arterial injury. CERVICAL VERTEBRAL ARTERIES: The vertebral arteries demonstrate mild calcific atheromatous disease, but no flow-limiting stenosis. No dissection or arterial injury. LUNGS  AND MEDIASTINUM: Mild patchy opacification/consolidation present medially within the left upper lobe. SOFT  TISSUES: No acute abnormality. BONES: No acute abnormality. CTA HEAD: ANTERIOR CIRCULATION: There is calcific plaque within the carotid siphons bilaterally and within the supraclinoid segments, with approximately 50% of luminal stenosis. The anterior and middle cerebral arteries appear to be normal in caliber. No evidence of aneurysm, large vessel occlusion or flow-limiting stenosis. POSTERIOR CIRCULATION: The posterior cerebral arteries and the cerebellar arteries appear to be normal in caliber. No evidence of aneurysm, large vessel occlusion or flow-limiting stenosis. No significant stenosis of the basilar artery. No significant stenosis of the vertebral arteries. OTHER: Chronic encephalomalacia changes again demonstrated within the left basal ganglia. There is moderate generalized cerebral volume loss and moderate diffuse cerebral white matter disease. No dural venous sinus thrombosis on this non-dedicated study. The patient is status post bilateral lens replacement. There is mild polypoid mucosal disease within the floors of the maxillary sinuses. IMPRESSION: 1. No large vessel occlusion, hemodynamically significant stenosis, or aneurysm in the head or neck. 2. Mild calcific plaque within the carotid bulbs and origins of the internal carotid arteries, with less than 20% luminal stenosis bilaterally. 3. Calcific plaque within the carotid siphons bilaterally and within the supraclinoid segments, with approximately 50% luminal stenosis. 4. Mild patchy opacification/consolidation medially within the left upper lobe, correlate clinically. Electronically signed by: Evalene Coho MD 10/19/2024 12:36 PM EST RP Workstation: [id number]   ECHOCARDIOGRAM COMPLETE Result Date: 10/19/2024    ECHOCARDIOGRAM REPORT   Patient Name:   Sara [name] Date of Exam: 10/19/2024 Medical Rec #:  [id number]         Height:       62.0 in Accession #:    [id number]        Weight:       205.9 lb Date of Birth:  Dec 15, 1945          BSA:          1.935 m Patient Age:    78 years          BP:           155/63 mmHg Patient Gender: F                 HR:           64 bpm. Exam Location:  Inpatient Procedure: 2D Echo, Cardiac Doppler and Color Doppler (Both Spectral and Color            Flow Doppler were utilized during procedure). Indications:    Stroke I63.9  History:        Patient has prior history of Echocardiogram examinations, most                 recent 06/14/2022. Stroke; Risk Factors:Diabetes, Hypertension and                 Dyslipidemia.  Sonographer:    Koleen Popper RDCS Referring Phys: [phone] RONDELL A SMITH IMPRESSIONS  1. Left ventricular ejection fraction, by estimation, is 55 to 60%. Left ventricular ejection fraction by 2D MOD biplane is 57.5 %. The left ventricle has normal function. The left ventricle has no regional wall motion abnormalities. There is moderate asymmetric left ventricular hypertrophy of the basal-septal segment. Left ventricular diastolic parameters are consistent with Grade I diastolic dysfunction (impaired relaxation).  2. Right ventricular systolic function is normal. The right ventricular size is normal.  3. The mitral valve is grossly normal. Trivial mitral valve regurgitation.  4. The aortic valve is tricuspid. Aortic valve regurgitation is trivial.  5. The inferior vena cava is normal in size with greater than 50% respiratory variability, suggesting right atrial pressure of 3 mmHg. Comparison(s): Changes from prior study are noted. 06/14/2022: LVEF 60-65%. FINDINGS  Left Ventricle: Left ventricular ejection fraction, by estimation, is 55 to 60%. Left ventricular ejection fraction by 2D MOD biplane is 57.5 %. The left ventricle has normal function. The left ventricle has no regional wall motion abnormalities. The left ventricular internal cavity size was normal in size. There is moderate asymmetric left ventricular hypertrophy of the basal-septal segment. Left ventricular diastolic parameters are  consistent with Grade I diastolic dysfunction (impaired relaxation). Indeterminate filling pressures. Right Ventricle: The right ventricular size is normal. No increase in right ventricular wall thickness. Right ventricular systolic function is normal. Left Atrium: Left atrial size was normal in size. Right Atrium: Right atrial size was normal in size. Pericardium: There is no evidence of pericardial effusion. Mitral Valve: The mitral valve is grossly normal. Trivial mitral valve regurgitation. Tricuspid Valve: The tricuspid valve is grossly normal. Tricuspid valve regurgitation is trivial. Aortic Valve: The aortic valve is tricuspid. Aortic valve regurgitation is trivial. Aortic regurgitation PHT measures 571 msec. Pulmonic Valve: The pulmonic valve was normal in structure. Pulmonic valve regurgitation is not visualized. Aorta: The aortic root and ascending aorta are structurally normal, with no evidence of dilitation. Venous: The inferior vena cava is normal in size with greater than 50% respiratory variability, suggesting right atrial pressure of 3 mmHg. IAS/Shunts: No atrial level shunt detected by color flow Doppler.  LEFT VENTRICLE PLAX 2D                        Biplane EF (MOD) LVIDd:         4.40 cm         LV Biplane EF:   Left LVIDs:         2.90 cm                          ventricular LV PW:         1.10 cm                          ejection LV IVS:        1.40 cm                          fraction by LVOT diam:     2.10 cm                          2D MOD LV SV:         63                               biplane is LV SV Index:   33                               57.5 %. LVOT Area:     3.46 cm                                Diastology  LV e' medial:    4.51 cm/s LV Volumes (MOD)               LV E/e' medial:  14.3 LV vol d, MOD    94.0 ml       LV e' lateral:   8.39 cm/s A2C:                           LV E/e' lateral: 7.7 LV vol d, MOD    115.0 ml A4C: LV vol s, MOD    34.5 ml  A2C: LV vol s, MOD    48.4 ml A4C: LV SV MOD A2C:   59.5 ml LV SV MOD A4C:   115.0 ml LV SV MOD BP:    61.7 ml RIGHT VENTRICLE             IVC RV Basal diam:  3.70 cm     IVC diam: 1.90 cm RV S prime:     12.00 cm/s TAPSE (M-mode): 2.0 cm LEFT ATRIUM           Index        RIGHT ATRIUM           Index LA diam:      2.80 cm 1.45 cm/m   RA Area:     13.30 cm LA Vol (A4C): 26.9 ml 13.90 ml/m  RA Volume:   30.00 ml  15.50 ml/m  AORTIC VALVE LVOT Vmax:   89.30 cm/s LVOT Vmean:  59.300 cm/s LVOT VTI:    0.183 m AI PHT:      571 msec  AORTA Ao Root diam: 3.30 cm Ao Asc diam:  3.60 cm MITRAL VALVE MV Area (PHT): 3.65 cm    SHUNTS MV Decel Time: 208 msec    Systemic VTI:  0.18 m MV E velocity: 64.30 cm/s  Systemic Diam: 2.10 cm MV A velocity: 87.70 cm/s MV E/A ratio:  0.73 Vinie Maxcy MD Electronically signed by Vinie Maxcy MD Signature Date/Time: 10/19/2024/11:43:52 AM    Final    DG CHEST PORT 1 VIEW Result Date: 10/18/2024 EXAM: 1 VIEW(S) XRAY OF THE CHEST 10/18/2024 09:09:00 AM COMPARISON: 06/20/2024 CLINICAL HISTORY: CVA (cerebral vascular accident) (HCC) FINDINGS: LUNGS AND PLEURA: Hypoinflated lungs. No focal pulmonary opacity. No pleural effusion. No pneumothorax. HEART AND MEDIASTINUM: No acute abnormality of the cardiac and mediastinal silhouettes. BONES AND SOFT TISSUES: No acute osseous abnormality. IMPRESSION: 1. Hypoinflated lungs. Electronically signed by: Evalene Coho MD 10/18/2024 09:13 AM EST RP Workstation: [id number]   MR BRAIN WO CONTRAST Result Date: 10/18/2024 EXAM: MRI BRAIN WITHOUT CONTRAST 10/18/2024 06:12:47 AM TECHNIQUE: Multiplanar multisequence MRI of the head/brain was performed without the administration of intravenous contrast. COMPARISON: CT of the head dated 10/18/2024. CLINICAL HISTORY: Neuro deficit, acute, stroke suspected. FINDINGS: BRAIN AND VENTRICLES: There is restricted diffusion present within the left pons on image 64 series 5, compatible with an acute  nonhemorrhagic lacunar infarct. There are chronic encephalomalacia changes present within the left basal ganglia and external capsule. There is extensive periventricular and deep cerebral white matter disease. No intracranial hemorrhage. No mass. No midline shift. No hydrocephalus. The sella is unremarkable. Normal flow voids. ORBITS: No acute abnormality. SINUSES AND MASTOIDS: No acute abnormality. BONES AND SOFT TISSUES: Normal marrow signal. No acute soft tissue abnormality. IMPRESSION: 1. Acute nonhemorrhagic lacunar infarct in the left pons. 2. Chronic encephalomalacia changes in the left basal ganglia and external capsule. 3. Extensive periventricular and deep  cerebral white matter disease. Electronically signed by: Evalene Coho MD 10/18/2024 06:20 AM EST RP Workstation: [id number]   CT Angio Aortobifemoral W and/or Wo Contrast Result Date: 10/18/2024 EXAM: CTA ABDOMEN AND PELVIS WITH AND WITHOUT CONTRAST AND RUNOFF CTA OF THE LOWER EXTREMITIES WITH CONTRAST 10/18/2024 03:20:52 AM TECHNIQUE: CTA images of the abdomen, pelvis and lower extremities with and without intravenous contrast. Three-dimensional MIP/volume rendered formations were performed. Automated exposure control, iterative reconstruction, and/or weight based adjustment of the mA/kV was utilized to reduce the radiation dose to as low as reasonably achievable. COMPARISON: None available. CLINICAL HISTORY: Claudication or leg ischemia. FINDINGS: VASCULATURE: AORTA: No acute finding. No abdominal aortic aneurysm. No dissection. 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 Distally, the celiac axis demonstrates classic anatomic configuration and is widely patent. SUPERIOR MESENTERIC ARTERY: Dictating a filling defect within the superior mesenteric artery at its origin is likely artifactual and related to streak artifact. No hemodynamically significant stenosis. No  aneurysm or dissection. INFERIOR MESENTERIC ARTERY: 50% stenosis of the inferior mesenteric artery at its origin secondary to calcified atherosclerotic plaque. Distally, the vessel is widely patent. RENAL ARTERIES: 50-75% stenosis of the proximal left renal artery secondary to calcified atherosclerotic plaque. Single renal arteries demonstrate normal vascular morphology. No aneurysm or dissection. RIGHT ILIAC ARTERIES: Extensive calcified atherosclerotic plaque within the common iliac artery. No hemodynamically significant stenosis. The internal iliac artery is patent at its origin. RIGHT FEMORAL ARTERIES: Surgical changes of right common femoral endarterectomy. 50% stenosis of the superficial femoral artery at its origin. Extensive calcified atherosclerotic plaque is seen throughout the superficial femoral artery with serial hemodynamically significant stenosis throughout its length of up to 50%. Profunda femoral artery is widely patent. RIGHT POPLITEAL ARTERY: Extensive atherosclerotic plaque resulting in a 50-75% stenosis of the P1 segment of the popliteal artery and greater than 75% stenosis of the P2 segment and P3 segments of the popliteal artery. RIGHT CALF ARTERIES: Extensive arteriosclerosis of the calf vasculature with 3-vessel runoff to the right ankle and patency of the dorsalis pedis and plantar arch. LEFT ILIAC ARTERIES: Extensive calcified atherosclerotic disease plaque without hemodynamically significant stenosis. Internal iliac artery is patent. LEFT FEMORAL ARTERIES: Wide patency of the common femoral artery. Extensive scattered calcified atherosclerotic plaque is seen throughout the superficial femoral artery, resulting in multifocal stenoses at 50%. Profunda femoral artery is widely patent. LEFT POPLITEAL ARTERY: Scattered calcified atherosclerotic plaque resulting in a focal 50% stenosis of the P1 segment of the popliteal artery and multiple 50-75% stenosis of the P2 and P3 segments of the  popliteal artery. LEFT CALF ARTERIES: Extensive arteriosclerosis of the calf vasculature. 3-vessel runoff to the ankle with patency of the dorsalis pedis and plantar arch. Right lower extremity arterial inflow: Extensive calcified atherosclerotic plaque within the common iliac artery. No hemodynamically significant stenosis. The internal iliac artery is patent at its origin. Right lower extremity arterial outflow: Surgical changes of right common femoral endarterectomy. 50% stenosis of the superficial femoral artery at its origin. Extensive calcified atherosclerotic plaque is seen throughout the superficial femoral artery with serial hemodynamically significant stenosis throughout its length of up to 50%. Profunda femoral artery is widely patent. Right lower extremity runoff: Extensive atherosclerotic plaque resulting in a 50-75% stenosis of the P1 segment of the popliteal artery and greater than 75% stenosis of the P2 segment and P3 segments of the popliteal artery. Extensive arteriosclerosis of the calf vasculature with 3-vessel runoff to the right  ankle and patency of the dorsalis pedis and plantar arch. Left lower extremity arterial inflow: Extensive calcified atherosclerotic disease plaque without hemodynamically significant stenosis. Internal iliac artery is patent. Left lower extremity arterial outflow: Wide patency of the common femoral artery. Extensive scattered calcified atherosclerotic plaque is seen throughout the superficial femoral artery, resulting in multifocal stenoses at 50%. Profunda femoral artery is widely patent. Left lower extremity arterial runoff: Scattered calcified atherosclerotic plaque resulting in a focal 50% stenosis of the P1 segment of the popliteal artery and multiple 50-75% stenosis of the P2 and P3 segments of the popliteal artery. Extensive arteriosclerosis of the calf vasculature. 3-vessel runoff to the ankle with patency of the dorsalis pedis and plantar arch. ABDOMEN AND  PELVIS: LOWER CHEST: Visualized portion of the lower chest demonstrates no acute abnormality. LIVER: The liver is unremarkable. GALLBLADDER AND BILE DUCTS: Gallbladder is unremarkable. No biliary ductal dilatation. SPLEEN: The spleen is unremarkable. PANCREAS: The pancreas is unremarkable. ADRENAL GLANDS: 8 mm benign right adrenal adenoma. No follow-up imaging is recommended. KIDNEYS, URETERS AND BLADDER: No stones in the kidneys or ureters. No hydronephrosis. No evidence of perinephric or periureteral stranding. Urinary bladder is unremarkable. GI AND Bowel: Severe sigmoid diverticulosis. No superimposed focal inflammatory change. The stomach, small bowel, and large bowel are otherwise unremarkable. The appendix is absent. REPRODUCTIVE: Reproductive organs are unremarkable. PERITONEUM AND RETROPERITONEUM: No ascites or free air. LYMPH NODES: No evidence of lymphadenopathy. BONES AND SOFT TISSUES: 2.2 x 2.7 cm high attenuation cystic collection within the right groin may represent a hematoma or abscess, postoperative seroma, or chronic hematoma. Infiltrative postoperative changes noted within the right groin superficial to the common femoral artery. Small bilateral knee effusions. Bilateral pedal edema. Asymmetric fatty atrophy of the right gastrocnemius musculature. Osseous structures are age-appropriate. No acute bone abnormality. No lytic or blastic bone lesion. Extensive coronary artery calcifications. IMPRESSION: 1. 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 2. 50-75% stenosis of the proximal left renal artery due to calcified atherosclerotic plaque; no aneurysm or dissection. 3. 50% stenosis of the inferior mesenteric artery at its origin due to calcified atherosclerotic plaque; distal vessel widely patent. Given the significant stenosis within the celiac axis , clinical correlation for signs and symptoms of chronic mesenteric  ischemia is recommended. 4. Right lower extremity arterial inflow: Extensive calcified atherosclerotic plaque within the common iliac artery without hemodynamically significant stenosis; internal iliac artery patent. 5. Right lower extremity arterial outflow: Surgical changes of right common femoral endarterectomy with 50% stenosis at the superficial femoral artery origin and serial stenoses up to 50% throughout its length; profunda femoral artery widely patent. 6. Right lower extremity arterial runoff: 50-75% stenosis of the P1 popliteal segment and greater than 75% stenosis of the P2 and P3 popliteal segments; three-vessel runoff to the ankle with patent dorsalis pedis and plantar arch. 7. Left lower extremity arterial inflow: Extensive calcified atherosclerotic plaque without hemodynamically significant stenosis; internal iliac artery patent. 8. Left lower extremity arterial outflow: Multifocal 50% stenoses in the superficial femoral artery; profunda femoral artery widely patent. 9. Left lower extremity arterial runoff: Focal 50% stenosis of the P1 popliteal segment and multiple 50-75% stenoses of the P2 and V3 popliteal segments; three-vessel runoff to the ankle with patent dorsalis pedis and plantar arch. 10. Severe sigmoid diverticulosis without superimposed inflammatory change. 11. Postoperative right groin fluid collection measuring 2.2 x 2.7 cm, differential includes hematoma, abscess, or seroma; correlate clinically and  consider targeted ultrasound if indicated. Electronically signed by: Dorethia Molt MD 10/18/2024 04:04 AM EST RP Workstation: [id number]   CT HEAD WO CONTRAST ( ) Result Date: 10/18/2024 EXAM: CT HEAD WITHOUT CONTRAST 10/18/2024 03:24:00 AM TECHNIQUE: CT of the head was performed without the administration of intravenous contrast. Automated exposure control, iterative reconstruction, and/or weight based adjustment of the mA/kV was utilized to reduce the radiation dose to as low as  reasonably achievable. COMPARISON: None available. CLINICAL HISTORY: Neuro deficit, acute, stroke suspected FINDINGS: BRAIN AND VENTRICLES: No acute hemorrhage. No evidence of acute infarct. Remote left basal ganglia infarct. Patchy white matter hypodensities compatible with chronic microvascular ischemic changes. No hydrocephalus. No extra-axial collection. No mass effect or midline shift. ORBITS: No acute abnormality. SINUSES: No acute abnormality. SOFT TISSUES AND SKULL: No acute soft tissue abnormality. No skull fracture. IMPRESSION: 1. No acute intracranial abnormality. 2. Remote left basal ganglia infarct and chronic microvascular ischemic change. Electronically signed by: Gilmore Molt MD 10/18/2024 03:31 AM EST RP Workstation: [id number]    There are no new results to review at this time.  Previous records (including but not limited to H&P, progress notes, nursing notes, TOC management) were reviewed in assessment of this patient.  Labs: CBC: Recent Labs  Lab 10/17/24 2210 10/19/24 0351  WBC 8.4 8.1  HGB 13.1 12.8  HCT 41.2 39.0  MCV 86.2 84.4  PLT 259 261   Basic Metabolic Panel: Recent Labs  Lab 10/17/24 2210 10/19/24 0351  NA 137 142  K 3.4* 3.1*  CL 95* 105  CO2 30 30  GLUCOSE 180* 39*  BUN 20 15  CREATININE 0.86 0.88  CALCIUM  9.0 8.7*   Liver Function Tests: Recent Labs  Lab 10/17/24 2210  AST 20  ALT 17  ALKPHOS 89  BILITOT 0.9  PROT 7.8  ALBUMIN  3.6   CBG: Recent Labs  Lab 10/18/24 2230 10/19/24 0017 10/19/24 0526 10/19/24 0558 10/19/24 1205  GLUCAP 104* 88 38* 167* 189*    Scheduled Meds:  aspirin  EC  81 mg Oral Daily   atorvastatin   40 mg Oral Daily   clopidogrel   75 mg Oral Daily   enoxaparin  (LOVENOX ) injection  40 mg Subcutaneous Q24H   insulin  aspart  0-5 Units Subcutaneous QHS   insulin  aspart  0-9 Units Subcutaneous TID WC   levothyroxine   50 mcg Oral Q0600   Continuous Infusions: PRN Meds:.acetaminophen  **OR** acetaminophen   (TYLENOL ) oral liquid 160 mg/5 mL **OR** acetaminophen , meclizine , senna-docusate  Family Communication: None at bedside  Disposition: Status is: Inpatient Remains inpatient appropriate because: CVA     Time spent: 36 minutes  Length of inpatient stay: 1 days  Author: Carliss LELON Canales, DO 10/19/2024 1:24 PM  For on call review [email or web address].

## 2024-10-19 NOTE — Evaluation (Signed)
 Occupational Therapy Evaluation Patient Details Name: Sara [name] MRN: [id number] DOB: 28-May-1946 Today's Date: 10/19/2024   History of Present Illness   Pt is a 78 y.o. female presenting with RLE weakness and numbness. 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 PMH: HTN, HLD, PVD s/p R femoral endarterectomy 06/2024, DMII, hypothyroidism, obesity.     Clinical Impressions PTA, pt lived with husband and was mod I for BADL, IADL, and driving. 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 Upon eval, pt presents with decreased R sided coordination LE>UE, decreaed vision, balance, safety awareness. Pt needing up to set-up for UB ADL and mod A for LB ADL. Pt to continue to benefit from acute OT services. Due to significant functional decline, recommending intensive multidisciplinary rehabilitation >3 hours/day to optimize safety and independence in ADL.        If plan is discharge home, recommend the following:   A little help with walking and/or transfers;A little help with bathing/dressing/bathroom;A lot of help with bathing/dressing/bathroom;Assistance with cooking/housework;Assist for transportation;Help with stairs or ramp for entrance     Functional Status Assessment   Patient has had a recent decline in their functional status and demonstrates the ability to make significant improvements in function in a reasonable and predictable amount of time.     Equipment Recommendations   Other (comment) (defer)     Recommendations for Other Services   Rehab consult     Precautions/Restrictions   Precautions Precautions: Fall Recall of Precautions/Restrictions: Intact Restrictions Weight Bearing Restrictions Per Provider Order: No     Mobility Bed Mobility               General bed mobility comments: received in chair    Transfers Overall transfer level: Needs  assistance Equipment used: Rolling walker (2 wheels) Transfers: Sit to/from Stand Sit to Stand: Contact guard assist           General transfer comment: Cues for hand placement      Balance Overall balance assessment: Mild deficits observed, not formally tested                                         ADL either performed or assessed with clinical judgement   ADL Overall ADL's : Needs assistance/impaired Eating/Feeding: Set up;Sitting   Grooming: Minimal assistance;Standing;Wash/dry hands   Upper Body Bathing: Set up;Sitting   Lower Body Bathing: Minimal assistance;Sit to/from stand   Upper Body Dressing : Set up;Sitting   Lower Body Dressing: Moderate assistance;Sit to/from stand   Toilet Transfer: Minimal assistance;Ambulation;Rolling walker (2 wheels)   Toileting- Clothing Manipulation and Hygiene: Set up;Sitting/lateral lean       Functional mobility during ADLs: Minimal assistance;Rolling walker (2 wheels)       Vision Baseline Vision/History: 1 Wears glasses Ability to See in Adequate Light: 0 Adequate Patient Visual Report: Blurring of vision Vision Assessment?: Yes Eye Alignment: Within Functional Limits Tracking/Visual Pursuits: Impaired - to be further tested in functional context (difficulty keeping eyes on target on both L and R) Visual Fields: No apparent deficits (appearing predominantly WFL, but did need cues to avoid compensating.)     Perception         Praxis         Pertinent Vitals/Pain       Extremity/Trunk  Assessment Upper Extremity Assessment Upper Extremity Assessment: RUE deficits/detail;LUE deficits/detail RUE Deficits / Details: RUE 4/5 shoulder flexion. 4+/5 otherwise. LUE Deficits / Details: pt reports numbness along ring finger; unclear if this is new   Lower Extremity Assessment Lower Extremity Assessment: Defer to PT evaluation RLE Deficits / Details: R hip flexion 3/5   Cervical / Trunk  Assessment Cervical / Trunk Assessment: Normal   Communication Communication Communication: Other (comment);Impaired (Speeaks english fairly well however prefers interpreter) Factors Affecting Communication: Hearing impaired   Cognition Arousal: Alert Behavior During Therapy: WFL for tasks assessed/performed Cognition: Difficult to assess, Cognition impaired Difficult to assess due to: Non-English speaking (in person spanish interpreter utilized for session, Susana)   Awareness: Online awareness impaired Memory impairment (select all impairments): Short-term memory Attention impairment (select first level of impairment): Selective attention Executive functioning impairment (select all impairments): Problem solving, Organization, Sequencing, Reasoning OT - Cognition Comments: min A and mod cues for safety with RW in smaller spaces due to attempts to push RW with one UE and hold onto surfaces with other demonstrating decr safety awareness with overall inconsistent use                 Following commands: Impaired Following commands impaired: Follows one step commands inconsistently, Follows one step commands with increased time     Cueing  General Comments   Cueing Techniques: Verbal cues;Tactile cues  VSS   Exercises     Shoulder Instructions      Home Living Family/patient expects to be discharged to:: Private residence Living Arrangements: Spouse/significant other Available Help at Discharge: Family;Available 24 hours/day Type of Home: House Home Access: Stairs to enter [employer] of Steps: 1 Entrance Stairs-Rails: None Home Layout: One level     Bathroom Shower/Tub: Chief Strategy Officer: Standard Bathroom Accessibility: Yes   Home Equipment: Production Designer, Theatre/television/film (4 wheels)   Additional Comments: Husband is blind pt reports is his primary caretaker, assists with all ADLs      Prior Functioning/Environment Prior Level of  Function : Independent/Modified Independent;Driving             Mobility Comments: Pt reports Mod I rollator since last week. Prior to that she was Mod I furniture surfing ADLs Comments: Ind with most ADLs    OT Problem List: Decreased strength;Decreased activity tolerance;Impaired balance (sitting and/or standing);Decreased coordination;Decreased cognition;Decreased safety awareness;Decreased knowledge of use of DME or AE;Impaired UE functional use   OT Treatment/Interventions: Self-care/ADL training;Therapeutic exercise;DME and/or AE instruction;Therapeutic activities;Patient/family education;Balance training      OT Goals(Current goals can be found in the care plan section)   Acute Rehab OT Goals Patient Stated Goal: get better OT Goal Formulation: With patient Time For Goal Achievement: 11/02/24 Potential to Achieve Goals: Good   OT Frequency:  Min 2X/week    Co-evaluation              AM-PAC OT 6 Clicks Daily Activity     Outcome Measure Help from another person eating meals?: None Help from another person taking care of personal grooming?: A Little Help from another person toileting, which includes using toliet, bedpan, or urinal?: A Little Help from another person bathing (including washing, rinsing, drying)?: A Little Help from another person to put on and taking off regular upper body clothing?: A Little Help from another person to put on and taking off regular lower body clothing?: A Lot 6 Click Score: 18   End of Session Equipment Utilized During Treatment: Gait belt;Rolling  walker (2 wheels) Nurse Communication: Mobility status  Activity Tolerance: Patient tolerated treatment well Patient left: in chair;with call bell/phone within reach;with chair alarm set  OT Visit Diagnosis: Unsteadiness on feet (R26.81);Muscle weakness (generalized) (M62.81);Other symptoms and signs involving cognitive function;Ataxia, unspecified (R27.0);Low vision, both eyes  (H54.2)                Time: 1330-1404 OT Time Calculation (min): 34 min Charges:  OT General Charges $OT Visit: 1 Visit OT Evaluation $OT Eval Moderate Complexity: 1 Mod  Elma JONETTA Lebron FREDERICK, OTR/L [facility] Acute Rehabilitation Office: [phone]   Elma JONETTA Lebron 10/19/2024, 5:00 PM

## 2024-10-20 LAB — GLUCOSE, CAPILLARY
Glucose-Capillary: 138 mg/dL — ABNORMAL HIGH (ref 70–99)
Glucose-Capillary: 207 mg/dL — ABNORMAL HIGH (ref 70–99)
Glucose-Capillary: 249 mg/dL — ABNORMAL HIGH (ref 70–99)
Glucose-Capillary: 286 mg/dL — ABNORMAL HIGH (ref 70–99)

## 2024-10-20 MED ORDER — INSULIN GLARGINE 100 UNIT/ML ~~LOC~~ SOLN
10.0000 [IU] | Freq: Every day | SUBCUTANEOUS | Status: DC
  Administered 2024-10-20 – 2024-10-23 (×4): 10 [IU] via SUBCUTANEOUS
  Filled 2024-10-20 (×4): qty 0.1

## 2024-10-20 NOTE — Inpatient Diabetes Management (Addendum)
 Inpatient Diabetes Program Recommendations  AACE/ADA: New Consensus Statement on Inpatient Glycemic Control (2015)  Target Ranges:  Prepandial:   less than 140 mg/dL      Peak postprandial:   less than 180 mg/dL (1-2 hours)      Critically ill patients:  140 - 180 mg/dL   Lab Results  Component Value Date   GLUCAP 138 (H) 10/20/2024   HGBA1C 8.4 (H) 10/18/2024    Review of Glycemic Control  Latest Reference Range & Units 10/19/24 12:05 10/19/24 16:13 10/19/24 17:43 10/19/24 21:05 10/20/24 06:19  Glucose-Capillary 70 - 99 mg/dL 810 (H) 770 (H) 784 (H) 301 (H) 138 (H)  Diabetes history: DM 2 Outpatient Diabetes medications:  Mettformin 1000 mg bid 70/30 30 units q AM and 50 units q PM (also has Humalog  75/25 listed??) Current orders for Inpatient glycemic control:  Novolog  0-9 units tid with meals and HS Inpatient Diabetes Program Recommendations:    Consider adding Semglee  10 units daily and add Novolog  meal coverage 3 units tid with meals (hold if patient eats less than 50% or NPO).   Thanks,  Randall Bullocks, RN, BC-ADM Inpatient Diabetes Coordinator Pager [phone]  (8a-5p)

## 2024-10-20 NOTE — Progress Notes (Signed)
 Progress Note   Patient: Sara [name] FMW:[id number] DOB: [birth year] DOA: 10/17/2024  DOS: the patient was seen and examined on 10/20/2024   Brief hospital course:  78 y.o. female with medical history significant of hypertension, hyperlipidemia, peripheral vascular disease s/p right femoral endarterectomy in 06/2024, diabetes mellitus type 2, hypothyroidism,  and obesity presents with right leg weakness and numbness.    Assessment and Plan:   Acute pontine CVA - Presented with right sided weakness which appears to have mostly resolved.  MRI noting acute nonhemorrhagic lacunar infarct in the left pons.  Neurology following closely.  Recommending DAPT with aspirin  and Plavix  for 3 weeks then transition to Plavix  daily thereafter.  Controllable risk factor reduction (hypertension, hyperlipidemia, diabetes mellitus, weight loss).  CT angio noting less than 20% stenosis bilaterally with no other frank occlusion or stenosis noted.  Echo noting no PFO.  Evaluated by PT/OT.  Patient pending CIR.  Uncontrolled insulin -dependent diabetes mellitus - A1c greater than 8 suggesting poor control.  Compliance makes.  Current home regimen includes 70/30 and metformin .  Currently adding insulin  glargine 10 units (not on 7030 while inpatient).  Insulin  sliding scale on board.   Hypertension - Resume losartan /hydrochlorothiazide .   Hyperlipidemia - Atorvastatin  on board.   Subjective: Monitor glucose closely.  Patient lying comfortably this morning.  Feeling improved.  Denies any weakness, fever, chills, chest pain, nausea, vomiting, abdominal pain.  Was able to work with PT/OT yesterday.  Awaiting CIR.  Physical Exam:  Vitals:   10/19/24 2200 10/20/24 0000 10/20/24 0500 10/20/24 0851  BP:  (!) 158/70 (!) 162/82 (!) 153/74  Pulse: 62 60 71 72  Resp: 15 16 16 18   Temp:  98.1 F (36.7 C) 98.2 F (36.8 C) (!) 97.5 F (36.4 C)  TempSrc:  Oral Oral Oral  SpO2:  95% 96% 100%  Weight:      Height:         GENERAL:  Alert, pleasant, no acute distress  HEENT:  EOMI, mild left facial droop CARDIOVASCULAR:  RRR, no murmurs appreciated RESPIRATORY:  Clear to auscultation, no wheezing, rales, or rhonchi GASTROINTESTINAL:  Soft, nontender, nondistended EXTREMITIES:  No LE edema bilaterally NEURO:  No new focal deficits appreciated SKIN:  No rashes noted PSYCH:  Appropriate mood and affect    Data Reviewed:  Imaging Studies: CT ANGIO HEAD NECK W WO CM Result Date: 10/19/2024 EXAM: CTA HEAD AND NECK WITHOUT AND WITH 10/19/2024 08:40:56 AM TECHNIQUE: CTA of the head and neck was performed without and with the administration of 75 mL of iohexol  (OMNIPAQUE ) 350 MG/ML injection. Multiplanar 2D and/or 3D reformatted images are provided for review. Automated exposure control, iterative reconstruction, and/or weight based adjustment of the mA/kV was utilized to reduce the radiation dose to as low as reasonably achievable. Stenosis of the internal carotid arteries measured using NASCET criteria. COMPARISON: CT of the head dated 10/18/2024. CLINICAL HISTORY: Stroke/TIA, determine embolic source. FINDINGS: CTA NECK: AORTIC ARCH AND ARCH VESSELS: Mild calcific plaque within the aortic arch. No dissection or arterial injury. No significant stenosis of the brachiocephalic or subclavian arteries. CERVICAL CAROTID ARTERIES: Mild calcific plaque within the carotid bulbs and origins of the internal carotid arteries, with less than 20% luminal stenosis bilaterally. No dissection or arterial injury. CERVICAL VERTEBRAL ARTERIES: The vertebral arteries demonstrate mild calcific atheromatous disease, but no flow-limiting stenosis. No dissection or arterial injury. LUNGS AND MEDIASTINUM: Mild patchy opacification/consolidation present medially within the left upper lobe. SOFT TISSUES: No acute abnormality. BONES: No acute  abnormality. CTA HEAD: ANTERIOR CIRCULATION: There is calcific plaque within the carotid siphons  bilaterally and within the supraclinoid segments, with approximately 50% of luminal stenosis. The anterior and middle cerebral arteries appear to be normal in caliber. No evidence of aneurysm, large vessel occlusion or flow-limiting stenosis. POSTERIOR CIRCULATION: The posterior cerebral arteries and the cerebellar arteries appear to be normal in caliber. No evidence of aneurysm, large vessel occlusion or flow-limiting stenosis. No significant stenosis of the basilar artery. No significant stenosis of the vertebral arteries. OTHER: Chronic encephalomalacia changes again demonstrated within the left basal ganglia. There is moderate generalized cerebral volume loss and moderate diffuse cerebral white matter disease. No dural venous sinus thrombosis on this non-dedicated study. The patient is status post bilateral lens replacement. There is mild polypoid mucosal disease within the floors of the maxillary sinuses. IMPRESSION: 1. No large vessel occlusion, hemodynamically significant stenosis, or aneurysm in the head or neck. 2. Mild calcific plaque within the carotid bulbs and origins of the internal carotid arteries, with less than 20% luminal stenosis bilaterally. 3. Calcific plaque within the carotid siphons bilaterally and within the supraclinoid segments, with approximately 50% luminal stenosis. 4. Mild patchy opacification/consolidation medially within the left upper lobe, correlate clinically. Electronically signed by: Evalene Coho MD 10/19/2024 12:36 PM EST RP Workstation: [id number]   ECHOCARDIOGRAM COMPLETE Result Date: 10/19/2024    ECHOCARDIOGRAM REPORT   Patient Name:   Sara [name] Date of Exam: 10/19/2024 Medical Rec #:  [id number]         Height:       62.0 in Accession #:    [id number]        Weight:       205.9 lb Date of Birth:  [birth year]         BSA:          1.935 m Patient Age:    78 years          BP:           155/63 mmHg Patient Gender: F                 HR:           64 bpm. Exam  Location:  Inpatient Procedure: 2D Echo, Cardiac Doppler and Color Doppler (Both Spectral and Color            Flow Doppler were utilized during procedure). Indications:    Stroke I63.9  History:        Patient has prior history of Echocardiogram examinations, most                 recent 06/14/2022. Stroke; Risk Factors:Diabetes, Hypertension and                 Dyslipidemia.  Sonographer:    Koleen Popper RDCS Referring Phys: [id number] RONDELL A SMITH IMPRESSIONS  1. Left ventricular ejection fraction, by estimation, is 55 to 60%. Left ventricular ejection fraction by 2D MOD biplane is 57.5 %. The left ventricle has normal function. The left ventricle has no regional wall motion abnormalities. There is moderate asymmetric left ventricular hypertrophy of the basal-septal segment. Left ventricular diastolic parameters are consistent with Grade I diastolic dysfunction (impaired relaxation).  2. Right ventricular systolic function is normal. The right ventricular size is normal.  3. The mitral valve is grossly normal. Trivial mitral valve regurgitation.  4. The aortic valve is tricuspid. Aortic valve regurgitation is trivial.  5. The inferior vena cava is normal in  size with greater than 50% respiratory variability, suggesting right atrial pressure of 3 mmHg. Comparison(s): Changes from prior study are noted. 06/14/2022: LVEF 60-65%. FINDINGS  Left Ventricle: Left ventricular ejection fraction, by estimation, is 55 to 60%. Left ventricular ejection fraction by 2D MOD biplane is 57.5 %. The left ventricle has normal function. The left ventricle has no regional wall motion abnormalities. The left ventricular internal cavity size was normal in size. There is moderate asymmetric left ventricular hypertrophy of the basal-septal segment. Left ventricular diastolic parameters are consistent with Grade I diastolic dysfunction (impaired relaxation). Indeterminate filling pressures. Right Ventricle: The right ventricular size is  normal. No increase in right ventricular wall thickness. Right ventricular systolic function is normal. Left Atrium: Left atrial size was normal in size. Right Atrium: Right atrial size was normal in size. Pericardium: There is no evidence of pericardial effusion. Mitral Valve: The mitral valve is grossly normal. Trivial mitral valve regurgitation. Tricuspid Valve: The tricuspid valve is grossly normal. Tricuspid valve regurgitation is trivial. Aortic Valve: The aortic valve is tricuspid. Aortic valve regurgitation is trivial. Aortic regurgitation PHT measures 571 msec. Pulmonic Valve: The pulmonic valve was normal in structure. Pulmonic valve regurgitation is not visualized. Aorta: The aortic root and ascending aorta are structurally normal, with no evidence of dilitation. Venous: The inferior vena cava is normal in size with greater than 50% respiratory variability, suggesting right atrial pressure of 3 mmHg. IAS/Shunts: No atrial level shunt detected by color flow Doppler.  LEFT VENTRICLE PLAX 2D                        Biplane EF (MOD) LVIDd:         4.40 cm         LV Biplane EF:   Left LVIDs:         2.90 cm                          ventricular LV PW:         1.10 cm                          ejection LV IVS:        1.40 cm                          fraction by LVOT diam:     2.10 cm                          2D MOD LV SV:         63                               biplane is LV SV Index:   33                               57.5 %. LVOT Area:     3.46 cm                                Diastology  LV e' medial:    4.51 cm/s LV Volumes (MOD)               LV E/e' medial:  14.3 LV vol d, MOD    94.0 ml       LV e' lateral:   8.39 cm/s A2C:                           LV E/e' lateral: 7.7 LV vol d, MOD    115.0 ml A4C: LV vol s, MOD    34.5 ml A2C: LV vol s, MOD    48.4 ml A4C: LV SV MOD A2C:   59.5 ml LV SV MOD A4C:   115.0 ml LV SV MOD BP:    61.7 ml RIGHT VENTRICLE             IVC RV Basal  diam:  3.70 cm     IVC diam: 1.90 cm RV S prime:     12.00 cm/s TAPSE (M-mode): 2.0 cm LEFT ATRIUM           Index        RIGHT ATRIUM           Index LA diam:      2.80 cm 1.45 cm/m   RA Area:     13.30 cm LA Vol (A4C): 26.9 ml 13.90 ml/m  RA Volume:   30.00 ml  15.50 ml/m  AORTIC VALVE LVOT Vmax:   89.30 cm/s LVOT Vmean:  59.300 cm/s LVOT VTI:    0.183 m AI PHT:      571 msec  AORTA Ao Root diam: 3.30 cm Ao Asc diam:  3.60 cm MITRAL VALVE MV Area (PHT): 3.65 cm    SHUNTS MV Decel Time: 208 msec    Systemic VTI:  0.18 m MV E velocity: 64.30 cm/s  Systemic Diam: 2.10 cm MV A velocity: 87.70 cm/s MV E/A ratio:  0.73 Vinie Maxcy MD Electronically signed by Vinie Maxcy MD Signature Date/Time: 10/19/2024/11:43:52 AM    Final    DG CHEST PORT 1 VIEW Result Date: 10/18/2024 EXAM: 1 VIEW(S) XRAY OF THE CHEST 10/18/2024 09:09:00 AM COMPARISON: 06/20/2024 CLINICAL HISTORY: CVA (cerebral vascular accident) (HCC) FINDINGS: LUNGS AND PLEURA: Hypoinflated lungs. No focal pulmonary opacity. No pleural effusion. No pneumothorax. HEART AND MEDIASTINUM: No acute abnormality of the cardiac and mediastinal silhouettes. BONES AND SOFT TISSUES: No acute osseous abnormality. IMPRESSION: 1. Hypoinflated lungs. Electronically signed by: Evalene Coho MD 10/18/2024 09:13 AM EST RP Workstation: [id number]   MR BRAIN WO CONTRAST Result Date: 10/18/2024 EXAM: MRI BRAIN WITHOUT CONTRAST 10/18/2024 06:12:47 AM TECHNIQUE: Multiplanar multisequence MRI of the head/brain was performed without the administration of intravenous contrast. COMPARISON: CT of the head dated 10/18/2024. CLINICAL HISTORY: Neuro deficit, acute, stroke suspected. FINDINGS: BRAIN AND VENTRICLES: There is restricted diffusion present within the left pons on image 64 series 5, compatible with an acute nonhemorrhagic lacunar infarct. There are chronic encephalomalacia changes present within the left basal ganglia and external capsule. There is extensive  periventricular and deep cerebral white matter disease. No intracranial hemorrhage. No mass. No midline shift. No hydrocephalus. The sella is unremarkable. Normal flow voids. ORBITS: No acute abnormality. SINUSES AND MASTOIDS: No acute abnormality. BONES AND SOFT TISSUES: Normal marrow signal. No acute soft tissue abnormality. IMPRESSION: 1. Acute nonhemorrhagic lacunar infarct in the left pons. 2. Chronic encephalomalacia changes in the left basal ganglia and external capsule. 3. Extensive periventricular and deep  cerebral white matter disease. Electronically signed by: Evalene Coho MD 10/18/2024 06:20 AM EST RP Workstation: [id number]   CT Angio Aortobifemoral W and/or Wo Contrast Result Date: 10/18/2024 EXAM: CTA ABDOMEN AND PELVIS WITH AND WITHOUT CONTRAST AND RUNOFF CTA OF THE LOWER EXTREMITIES WITH CONTRAST 10/18/2024 03:20:52 AM TECHNIQUE: CTA images of the abdomen, pelvis and lower extremities with and without intravenous contrast. Three-dimensional MIP/volume rendered formations were performed. Automated exposure control, iterative reconstruction, and/or weight based adjustment of the mA/kV was utilized to reduce the radiation dose to as low as reasonably achievable. COMPARISON: None available. CLINICAL HISTORY: Claudication or leg ischemia. FINDINGS: VASCULATURE: AORTA: No acute finding. No abdominal aortic aneurysm. No dissection. 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 Distally, the celiac axis demonstrates classic anatomic configuration and is widely patent. SUPERIOR MESENTERIC ARTERY: Dictating a filling defect within the superior mesenteric artery at its origin is likely artifactual and related to streak artifact. No hemodynamically significant stenosis. No aneurysm or dissection. INFERIOR MESENTERIC ARTERY: 50% stenosis of the inferior mesenteric artery at its origin secondary to calcified atherosclerotic plaque.  Distally, the vessel is widely patent. RENAL ARTERIES: 50-75% stenosis of the proximal left renal artery secondary to calcified atherosclerotic plaque. Single renal arteries demonstrate normal vascular morphology. No aneurysm or dissection. RIGHT ILIAC ARTERIES: Extensive calcified atherosclerotic plaque within the common iliac artery. No hemodynamically significant stenosis. The internal iliac artery is patent at its origin. RIGHT FEMORAL ARTERIES: Surgical changes of right common femoral endarterectomy. 50% stenosis of the superficial femoral artery at its origin. Extensive calcified atherosclerotic plaque is seen throughout the superficial femoral artery with serial hemodynamically significant stenosis throughout its length of up to 50%. Profunda femoral artery is widely patent. RIGHT POPLITEAL ARTERY: Extensive atherosclerotic plaque resulting in a 50-75% stenosis of the P1 segment of the popliteal artery and greater than 75% stenosis of the P2 segment and P3 segments of the popliteal artery. RIGHT CALF ARTERIES: Extensive arteriosclerosis of the calf vasculature with 3-vessel runoff to the right ankle and patency of the dorsalis pedis and plantar arch. LEFT ILIAC ARTERIES: Extensive calcified atherosclerotic disease plaque without hemodynamically significant stenosis. Internal iliac artery is patent. LEFT FEMORAL ARTERIES: Wide patency of the common femoral artery. Extensive scattered calcified atherosclerotic plaque is seen throughout the superficial femoral artery, resulting in multifocal stenoses at 50%. Profunda femoral artery is widely patent. LEFT POPLITEAL ARTERY: Scattered calcified atherosclerotic plaque resulting in a focal 50% stenosis of the P1 segment of the popliteal artery and multiple 50-75% stenosis of the P2 and P3 segments of the popliteal artery. LEFT CALF ARTERIES: Extensive arteriosclerosis of the calf vasculature. 3-vessel runoff to the ankle with patency of the dorsalis pedis and plantar  arch. Right lower extremity arterial inflow: Extensive calcified atherosclerotic plaque within the common iliac artery. No hemodynamically significant stenosis. The internal iliac artery is patent at its origin. Right lower extremity arterial outflow: Surgical changes of right common femoral endarterectomy. 50% stenosis of the superficial femoral artery at its origin. Extensive calcified atherosclerotic plaque is seen throughout the superficial femoral artery with serial hemodynamically significant stenosis throughout its length of up to 50%. Profunda femoral artery is widely patent. Right lower extremity runoff: Extensive atherosclerotic plaque resulting in a 50-75% stenosis of the P1 segment of the popliteal artery and greater than 75% stenosis of the P2 segment and P3 segments of the popliteal artery. Extensive arteriosclerosis of the calf vasculature with 3-vessel runoff to the right  ankle and patency of the dorsalis pedis and plantar arch. Left lower extremity arterial inflow: Extensive calcified atherosclerotic disease plaque without hemodynamically significant stenosis. Internal iliac artery is patent. Left lower extremity arterial outflow: Wide patency of the common femoral artery. Extensive scattered calcified atherosclerotic plaque is seen throughout the superficial femoral artery, resulting in multifocal stenoses at 50%. Profunda femoral artery is widely patent. Left lower extremity arterial runoff: Scattered calcified atherosclerotic plaque resulting in a focal 50% stenosis of the P1 segment of the popliteal artery and multiple 50-75% stenosis of the P2 and P3 segments of the popliteal artery. Extensive arteriosclerosis of the calf vasculature. 3-vessel runoff to the ankle with patency of the dorsalis pedis and plantar arch. ABDOMEN AND PELVIS: LOWER CHEST: Visualized portion of the lower chest demonstrates no acute abnormality. LIVER: The liver is unremarkable. GALLBLADDER AND BILE DUCTS: Gallbladder is  unremarkable. No biliary ductal dilatation. SPLEEN: The spleen is unremarkable. PANCREAS: The pancreas is unremarkable. ADRENAL GLANDS: 8 mm benign right adrenal adenoma. No follow-up imaging is recommended. KIDNEYS, URETERS AND BLADDER: No stones in the kidneys or ureters. No hydronephrosis. No evidence of perinephric or periureteral stranding. Urinary bladder is unremarkable. GI AND Bowel: Severe sigmoid diverticulosis. No superimposed focal inflammatory change. The stomach, small bowel, and large bowel are otherwise unremarkable. The appendix is absent. REPRODUCTIVE: Reproductive organs are unremarkable. PERITONEUM AND RETROPERITONEUM: No ascites or free air. LYMPH NODES: No evidence of lymphadenopathy. BONES AND SOFT TISSUES: 2.2 x 2.7 cm high attenuation cystic collection within the right groin may represent a hematoma or abscess, postoperative seroma, or chronic hematoma. Infiltrative postoperative changes noted within the right groin superficial to the common femoral artery. Small bilateral knee effusions. Bilateral pedal edema. Asymmetric fatty atrophy of the right gastrocnemius musculature. Osseous structures are age-appropriate. No acute bone abnormality. No lytic or blastic bone lesion. Extensive coronary artery calcifications. IMPRESSION: 1. 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 2. 50-75% stenosis of the proximal left renal artery due to calcified atherosclerotic plaque; no aneurysm or dissection. 3. 50% stenosis of the inferior mesenteric artery at its origin due to calcified atherosclerotic plaque; distal vessel widely patent. Given the significant stenosis within the celiac axis , clinical correlation for signs and symptoms of chronic mesenteric ischemia is recommended. 4. Right lower extremity arterial inflow: Extensive calcified atherosclerotic plaque within the common iliac artery without hemodynamically  significant stenosis; internal iliac artery patent. 5. Right lower extremity arterial outflow: Surgical changes of right common femoral endarterectomy with 50% stenosis at the superficial femoral artery origin and serial stenoses up to 50% throughout its length; profunda femoral artery widely patent. 6. Right lower extremity arterial runoff: 50-75% stenosis of the P1 popliteal segment and greater than 75% stenosis of the P2 and P3 popliteal segments; three-vessel runoff to the ankle with patent dorsalis pedis and plantar arch. 7. Left lower extremity arterial inflow: Extensive calcified atherosclerotic plaque without hemodynamically significant stenosis; internal iliac artery patent. 8. Left lower extremity arterial outflow: Multifocal 50% stenoses in the superficial femoral artery; profunda femoral artery widely patent. 9. Left lower extremity arterial runoff: Focal 50% stenosis of the P1 popliteal segment and multiple 50-75% stenoses of the P2 and V3 popliteal segments; three-vessel runoff to the ankle with patent dorsalis pedis and plantar arch. 10. Severe sigmoid diverticulosis without superimposed inflammatory change. 11. Postoperative right groin fluid collection measuring 2.2 x 2.7 cm, differential includes hematoma, abscess, or seroma; correlate clinically and  consider targeted ultrasound if indicated. Electronically signed by: Dorethia Molt MD 10/18/2024 04:04 AM EST RP Workstation: [id number]   CT HEAD WO CONTRAST ( ) Result Date: 10/18/2024 EXAM: CT HEAD WITHOUT CONTRAST 10/18/2024 03:24:00 AM TECHNIQUE: CT of the head was performed without the administration of intravenous contrast. Automated exposure control, iterative reconstruction, and/or weight based adjustment of the mA/kV was utilized to reduce the radiation dose to as low as reasonably achievable. COMPARISON: None available. CLINICAL HISTORY: Neuro deficit, acute, stroke suspected FINDINGS: BRAIN AND VENTRICLES: No acute hemorrhage. No  evidence of acute infarct. Remote left basal ganglia infarct. Patchy white matter hypodensities compatible with chronic microvascular ischemic changes. No hydrocephalus. No extra-axial collection. No mass effect or midline shift. ORBITS: No acute abnormality. SINUSES: No acute abnormality. SOFT TISSUES AND SKULL: No acute soft tissue abnormality. No skull fracture. IMPRESSION: 1. No acute intracranial abnormality. 2. Remote left basal ganglia infarct and chronic microvascular ischemic change. Electronically signed by: Gilmore Molt MD 10/18/2024 03:31 AM EST RP Workstation: [id number]    There are no new results to review at this time.  Previous records (including but not limited to H&P, progress notes, nursing notes, TOC management) were reviewed in assessment of this patient.  Labs: CBC: Recent Labs  Lab 10/17/24 2210 10/19/24 0351  WBC 8.4 8.1  HGB 13.1 12.8  HCT 41.2 39.0  MCV 86.2 84.4  PLT 259 261   Basic Metabolic Panel: Recent Labs  Lab 10/17/24 2210 10/19/24 0351  NA 137 142  K 3.4* 3.1*  CL 95* 105  CO2 30 30  GLUCOSE 180* 39*  BUN 20 15  CREATININE 0.86 0.88  CALCIUM  9.0 8.7*   Liver Function Tests: Recent Labs  Lab 10/17/24 2210  AST 20  ALT 17  ALKPHOS 89  BILITOT 0.9  PROT 7.8  ALBUMIN  3.6   CBG: Recent Labs  Lab 10/19/24 1205 10/19/24 1613 10/19/24 1743 10/19/24 2105 10/20/24 0619  GLUCAP 189* 229* 215* 301* 138*    Scheduled Meds:  aspirin  EC  81 mg Oral Daily   atorvastatin   40 mg Oral Daily   clopidogrel   75 mg Oral Daily   enoxaparin  (LOVENOX ) injection  40 mg Subcutaneous Q24H   insulin  aspart  0-5 Units Subcutaneous QHS   insulin  aspart  0-9 Units Subcutaneous TID WC   levothyroxine   50 mcg Oral Q0600   Continuous Infusions: PRN Meds:.acetaminophen  **OR** acetaminophen  (TYLENOL ) oral liquid 160 mg/5 mL **OR** acetaminophen , meclizine , senna-docusate  Family Communication: Not at bedside  Disposition: Status is:  Inpatient Remains inpatient appropriate because: CVA     Time spent: 34 minutes  Length of inpatient stay: 2 days  Author: Carliss LELON Canales, DO 10/20/2024 12:14 PM  For on call review [email or web address].

## 2024-10-20 NOTE — Progress Notes (Signed)

## 2024-10-21 DIAGNOSIS — I6300 Cerebral infarction due to thrombosis of unspecified precerebral artery: Secondary | ICD-10-CM

## 2024-10-21 LAB — GLUCOSE, CAPILLARY
Glucose-Capillary: 158 mg/dL — ABNORMAL HIGH (ref 70–99)
Glucose-Capillary: 211 mg/dL — ABNORMAL HIGH (ref 70–99)
Glucose-Capillary: 210 mg/dL — ABNORMAL HIGH (ref 70–99)
Glucose-Capillary: 208 mg/dL — ABNORMAL HIGH (ref 70–99)

## 2024-10-21 MED ORDER — BISACODYL 5 MG PO TBEC
5.0000 mg | DELAYED_RELEASE_TABLET | Freq: Every day | ORAL | Status: DC | PRN
  Administered 2024-10-22: 5 mg via ORAL
  Filled 2024-10-21: qty 1

## 2024-10-21 MED ORDER — BISACODYL 10 MG RE SUPP: 10.0000 mg | Freq: Every day | RECTAL | Status: DC | PRN

## 2024-10-21 MED ORDER — POLYETHYLENE GLYCOL 3350 17 G PO PACK
17.0000 g | PACK | Freq: Every day | ORAL | Status: DC
  Administered 2024-10-21 – 2024-10-23 (×3): 17 g via ORAL
  Filled 2024-10-21 (×3): qty 1

## 2024-10-21 NOTE — Progress Notes (Signed)
 SLP Cancellation Note  Patient Details Name: Sara [name] MRN: [id number] DOB: 1946/06/18   Cancelled treatment:       Reason Eval/Treat Not Completed: Patient at procedure or test/unavailable   Charels Stambaugh, Consuelo Fitch 10/21/2024, 2:16 PM

## 2024-10-21 NOTE — Progress Notes (Signed)
 Progress Note   Patient: Sara [name] FMW:[id number] DOB: [birth year] DOA: 10/17/2024  DOS: the patient was seen and examined on 10/21/2024   Brief hospital course:  78 y.o. female with medical history significant of hypertension, hyperlipidemia, peripheral vascular disease s/p right femoral endarterectomy in 06/2024, diabetes mellitus type 2, hypothyroidism,  and obesity presents with right leg weakness and numbness.    Assessment and Plan:   Acute pontine CVA - Presented with right sided weakness which appears to have mostly resolved.  MRI noting acute nonhemorrhagic lacunar infarct in the left pons.  Neurology following closely.  Recommending DAPT with aspirin  and Plavix  for 3 weeks then transition to Plavix  daily thereafter.  Controllable risk factor reduction (hypertension, hyperlipidemia, diabetes mellitus, weight loss).  CT angio noting less than 20% stenosis bilaterally with no other frank occlusion or stenosis noted.  Echo noting no PFO.  Evaluated by PT/OT.  Patient pending CIR.  Uncontrolled insulin -dependent diabetes mellitus - A1c greater than 8 suggesting poor control.  Compliance makes.  Current home regimen includes 70/30 and metformin .  Currently adding insulin  glargine 10 units (not on 7030 while inpatient).  Insulin  sliding scale on board.   Hypertension - Resume losartan /hydrochlorothiazide .   Hyperlipidemia - Atorvastatin  on board.   Subjective: Monitor glucose closely.  Patient lying comfortably this morning.  Denies any weakness, fever, chills, chest pain, nausea, vomiting, abdominal pain.  Was able to work with PT/OT yesterday.  Awaiting CIR.  Physical Exam:  Vitals:   10/20/24 0500 10/20/24 0851 10/20/24 1222 10/20/24 1549  BP: (!) 162/82 (!) 153/74 (!) 127/58 139/62  Pulse: 71 72 70 66  Resp: 16 18 18 18   Temp: 98.2 F (36.8 C) (!) 97.5 F (36.4 C) 99.1 F (37.3 C) 98.1 F (36.7 C)  TempSrc: Oral Oral Oral Oral  SpO2: 96% 100% 96% 96%  Weight:       Height:        GENERAL:  Alert, pleasant, no acute distress  HEENT:  EOMI, mild left facial droop CARDIOVASCULAR:  RRR, no murmurs appreciated RESPIRATORY:  Clear to auscultation, no wheezing, rales, or rhonchi GASTROINTESTINAL:  Soft, nontender, nondistended EXTREMITIES:  No LE edema bilaterally NEURO:  No new focal deficits appreciated SKIN:  No rashes noted PSYCH:  Appropriate mood and affect    Data Reviewed:  Imaging Studies: CT ANGIO HEAD NECK W WO CM Result Date: 10/19/2024 EXAM: CTA HEAD AND NECK WITHOUT AND WITH 10/19/2024 08:40:56 AM TECHNIQUE: CTA of the head and neck was performed without and with the administration of 75 mL of iohexol  (OMNIPAQUE ) 350 MG/ML injection. Multiplanar 2D and/or 3D reformatted images are provided for review. Automated exposure control, iterative reconstruction, and/or weight based adjustment of the mA/kV was utilized to reduce the radiation dose to as low as reasonably achievable. Stenosis of the internal carotid arteries measured using NASCET criteria. COMPARISON: CT of the head dated 10/18/2024. CLINICAL HISTORY: Stroke/TIA, determine embolic source. FINDINGS: CTA NECK: AORTIC ARCH AND ARCH VESSELS: Mild calcific plaque within the aortic arch. No dissection or arterial injury. No significant stenosis of the brachiocephalic or subclavian arteries. CERVICAL CAROTID ARTERIES: Mild calcific plaque within the carotid bulbs and origins of the internal carotid arteries, with less than 20% luminal stenosis bilaterally. No dissection or arterial injury. CERVICAL VERTEBRAL ARTERIES: The vertebral arteries demonstrate mild calcific atheromatous disease, but no flow-limiting stenosis. No dissection or arterial injury. LUNGS AND MEDIASTINUM: Mild patchy opacification/consolidation present medially within the left upper lobe. SOFT TISSUES: No acute abnormality. BONES: No acute  abnormality. CTA HEAD: ANTERIOR CIRCULATION: There is calcific plaque within the carotid  siphons bilaterally and within the supraclinoid segments, with approximately 50% of luminal stenosis. The anterior and middle cerebral arteries appear to be normal in caliber. No evidence of aneurysm, large vessel occlusion or flow-limiting stenosis. POSTERIOR CIRCULATION: The posterior cerebral arteries and the cerebellar arteries appear to be normal in caliber. No evidence of aneurysm, large vessel occlusion or flow-limiting stenosis. No significant stenosis of the basilar artery. No significant stenosis of the vertebral arteries. OTHER: Chronic encephalomalacia changes again demonstrated within the left basal ganglia. There is moderate generalized cerebral volume loss and moderate diffuse cerebral white matter disease. No dural venous sinus thrombosis on this non-dedicated study. The patient is status post bilateral lens replacement. There is mild polypoid mucosal disease within the floors of the maxillary sinuses. IMPRESSION: 1. No large vessel occlusion, hemodynamically significant stenosis, or aneurysm in the head or neck. 2. Mild calcific plaque within the carotid bulbs and origins of the internal carotid arteries, with less than 20% luminal stenosis bilaterally. 3. Calcific plaque within the carotid siphons bilaterally and within the supraclinoid segments, with approximately 50% luminal stenosis. 4. Mild patchy opacification/consolidation medially within the left upper lobe, correlate clinically. Electronically signed by: Evalene Coho MD 10/19/2024 12:36 PM EST RP Workstation: [id number]   ECHOCARDIOGRAM COMPLETE Result Date: 10/19/2024    ECHOCARDIOGRAM REPORT   Patient Name:   Sara [name] Date of Exam: 10/19/2024 Medical Rec #:  [id number]         Height:       62.0 in Accession #:    [id number]        Weight:       205.9 lb Date of Birth:  07-29-1946         BSA:          1.935 m Patient Age:    78 years          BP:           155/63 mmHg Patient Gender: F                 HR:           64 bpm.  Exam Location:  Inpatient Procedure: 2D Echo, Cardiac Doppler and Color Doppler (Both Spectral and Color            Flow Doppler were utilized during procedure). Indications:    Stroke I63.9  History:        Patient has prior history of Echocardiogram examinations, most                 recent 06/14/2022. Stroke; Risk Factors:Diabetes, Hypertension and                 Dyslipidemia.  Sonographer:    Koleen Popper RDCS Referring Phys: [id number] RONDELL A SMITH IMPRESSIONS  1. Left ventricular ejection fraction, by estimation, is 55 to 60%. Left ventricular ejection fraction by 2D MOD biplane is 57.5 %. The left ventricle has normal function. The left ventricle has no regional wall motion abnormalities. There is moderate asymmetric left ventricular hypertrophy of the basal-septal segment. Left ventricular diastolic parameters are consistent with Grade I diastolic dysfunction (impaired relaxation).  2. Right ventricular systolic function is normal. The right ventricular size is normal.  3. The mitral valve is grossly normal. Trivial mitral valve regurgitation.  4. The aortic valve is tricuspid. Aortic valve regurgitation is trivial.  5. The inferior vena cava is normal in  size with greater than 50% respiratory variability, suggesting right atrial pressure of 3 mmHg. Comparison(s): Changes from prior study are noted. 06/14/2022: LVEF 60-65%. FINDINGS  Left Ventricle: Left ventricular ejection fraction, by estimation, is 55 to 60%. Left ventricular ejection fraction by 2D MOD biplane is 57.5 %. The left ventricle has normal function. The left ventricle has no regional wall motion abnormalities. The left ventricular internal cavity size was normal in size. There is moderate asymmetric left ventricular hypertrophy of the basal-septal segment. Left ventricular diastolic parameters are consistent with Grade I diastolic dysfunction (impaired relaxation). Indeterminate filling pressures. Right Ventricle: The right ventricular size is  normal. No increase in right ventricular wall thickness. Right ventricular systolic function is normal. Left Atrium: Left atrial size was normal in size. Right Atrium: Right atrial size was normal in size. Pericardium: There is no evidence of pericardial effusion. Mitral Valve: The mitral valve is grossly normal. Trivial mitral valve regurgitation. Tricuspid Valve: The tricuspid valve is grossly normal. Tricuspid valve regurgitation is trivial. Aortic Valve: The aortic valve is tricuspid. Aortic valve regurgitation is trivial. Aortic regurgitation PHT measures 571 msec. Pulmonic Valve: The pulmonic valve was normal in structure. Pulmonic valve regurgitation is not visualized. Aorta: The aortic root and ascending aorta are structurally normal, with no evidence of dilitation. Venous: The inferior vena cava is normal in size with greater than 50% respiratory variability, suggesting right atrial pressure of 3 mmHg. IAS/Shunts: No atrial level shunt detected by color flow Doppler.  LEFT VENTRICLE PLAX 2D                        Biplane EF (MOD) LVIDd:         4.40 cm         LV Biplane EF:   Left LVIDs:         2.90 cm                          ventricular LV PW:         1.10 cm                          ejection LV IVS:        1.40 cm                          fraction by LVOT diam:     2.10 cm                          2D MOD LV SV:         63                               biplane is LV SV Index:   33                               57.5 %. LVOT Area:     3.46 cm                                Diastology  LV e' medial:    4.51 cm/s LV Volumes (MOD)               LV E/e' medial:  14.3 LV vol d, MOD    94.0 ml       LV e' lateral:   8.39 cm/s A2C:                           LV E/e' lateral: 7.7 LV vol d, MOD    115.0 ml A4C: LV vol s, MOD    34.5 ml A2C: LV vol s, MOD    48.4 ml A4C: LV SV MOD A2C:   59.5 ml LV SV MOD A4C:   115.0 ml LV SV MOD BP:    61.7 ml RIGHT VENTRICLE             IVC RV Basal  diam:  3.70 cm     IVC diam: 1.90 cm RV S prime:     12.00 cm/s TAPSE (M-mode): 2.0 cm LEFT ATRIUM           Index        RIGHT ATRIUM           Index LA diam:      2.80 cm 1.45 cm/m   RA Area:     13.30 cm LA Vol (A4C): 26.9 ml 13.90 ml/m  RA Volume:   30.00 ml  15.50 ml/m  AORTIC VALVE LVOT Vmax:   89.30 cm/s LVOT Vmean:  59.300 cm/s LVOT VTI:    0.183 m AI PHT:      571 msec  AORTA Ao Root diam: 3.30 cm Ao Asc diam:  3.60 cm MITRAL VALVE MV Area (PHT): 3.65 cm    SHUNTS MV Decel Time: 208 msec    Systemic VTI:  0.18 m MV E velocity: 64.30 cm/s  Systemic Diam: 2.10 cm MV A velocity: 87.70 cm/s MV E/A ratio:  0.73 Vinie Maxcy MD Electronically signed by Vinie Maxcy MD Signature Date/Time: 10/19/2024/11:43:52 AM    Final    DG CHEST PORT 1 VIEW Result Date: 10/18/2024 EXAM: 1 VIEW(S) XRAY OF THE CHEST 10/18/2024 09:09:00 AM COMPARISON: 06/20/2024 CLINICAL HISTORY: CVA (cerebral vascular accident) (HCC) FINDINGS: LUNGS AND PLEURA: Hypoinflated lungs. No focal pulmonary opacity. No pleural effusion. No pneumothorax. HEART AND MEDIASTINUM: No acute abnormality of the cardiac and mediastinal silhouettes. BONES AND SOFT TISSUES: No acute osseous abnormality. IMPRESSION: 1. Hypoinflated lungs. Electronically signed by: Evalene Coho MD 10/18/2024 09:13 AM EST RP Workstation: [id number]   MR BRAIN WO CONTRAST Result Date: 10/18/2024 EXAM: MRI BRAIN WITHOUT CONTRAST 10/18/2024 06:12:47 AM TECHNIQUE: Multiplanar multisequence MRI of the head/brain was performed without the administration of intravenous contrast. COMPARISON: CT of the head dated 10/18/2024. CLINICAL HISTORY: Neuro deficit, acute, stroke suspected. FINDINGS: BRAIN AND VENTRICLES: There is restricted diffusion present within the left pons on image 64 series 5, compatible with an acute nonhemorrhagic lacunar infarct. There are chronic encephalomalacia changes present within the left basal ganglia and external capsule. There is extensive  periventricular and deep cerebral white matter disease. No intracranial hemorrhage. No mass. No midline shift. No hydrocephalus. The sella is unremarkable. Normal flow voids. ORBITS: No acute abnormality. SINUSES AND MASTOIDS: No acute abnormality. BONES AND SOFT TISSUES: Normal marrow signal. No acute soft tissue abnormality. IMPRESSION: 1. Acute nonhemorrhagic lacunar infarct in the left pons. 2. Chronic encephalomalacia changes in the left basal ganglia and external capsule. 3. Extensive periventricular and deep  cerebral white matter disease. Electronically signed by: Evalene Coho MD 10/18/2024 06:20 AM EST RP Workstation: [id number]   CT Angio Aortobifemoral W and/or Wo Contrast Result Date: 10/18/2024 EXAM: CTA ABDOMEN AND PELVIS WITH AND WITHOUT CONTRAST AND RUNOFF CTA OF THE LOWER EXTREMITIES WITH CONTRAST 10/18/2024 03:20:52 AM TECHNIQUE: CTA images of the abdomen, pelvis and lower extremities with and without intravenous contrast. Three-dimensional MIP/volume rendered formations were performed. Automated exposure control, iterative reconstruction, and/or weight based adjustment of the mA/kV was utilized to reduce the radiation dose to as low as reasonably achievable. COMPARISON: None available. CLINICAL HISTORY: Claudication or leg ischemia. FINDINGS: VASCULATURE: AORTA: No acute finding. No abdominal aortic aneurysm. No dissection. 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 Distally, the celiac axis demonstrates classic anatomic configuration and is widely patent. SUPERIOR MESENTERIC ARTERY: Dictating a filling defect within the superior mesenteric artery at its origin is likely artifactual and related to streak artifact. No hemodynamically significant stenosis. No aneurysm or dissection. INFERIOR MESENTERIC ARTERY: 50% stenosis of the inferior mesenteric artery at its origin secondary to calcified atherosclerotic plaque.  Distally, the vessel is widely patent. RENAL ARTERIES: 50-75% stenosis of the proximal left renal artery secondary to calcified atherosclerotic plaque. Single renal arteries demonstrate normal vascular morphology. No aneurysm or dissection. RIGHT ILIAC ARTERIES: Extensive calcified atherosclerotic plaque within the common iliac artery. No hemodynamically significant stenosis. The internal iliac artery is patent at its origin. RIGHT FEMORAL ARTERIES: Surgical changes of right common femoral endarterectomy. 50% stenosis of the superficial femoral artery at its origin. Extensive calcified atherosclerotic plaque is seen throughout the superficial femoral artery with serial hemodynamically significant stenosis throughout its length of up to 50%. Profunda femoral artery is widely patent. RIGHT POPLITEAL ARTERY: Extensive atherosclerotic plaque resulting in a 50-75% stenosis of the P1 segment of the popliteal artery and greater than 75% stenosis of the P2 segment and P3 segments of the popliteal artery. RIGHT CALF ARTERIES: Extensive arteriosclerosis of the calf vasculature with 3-vessel runoff to the right ankle and patency of the dorsalis pedis and plantar arch. LEFT ILIAC ARTERIES: Extensive calcified atherosclerotic disease plaque without hemodynamically significant stenosis. Internal iliac artery is patent. LEFT FEMORAL ARTERIES: Wide patency of the common femoral artery. Extensive scattered calcified atherosclerotic plaque is seen throughout the superficial femoral artery, resulting in multifocal stenoses at 50%. Profunda femoral artery is widely patent. LEFT POPLITEAL ARTERY: Scattered calcified atherosclerotic plaque resulting in a focal 50% stenosis of the P1 segment of the popliteal artery and multiple 50-75% stenosis of the P2 and P3 segments of the popliteal artery. LEFT CALF ARTERIES: Extensive arteriosclerosis of the calf vasculature. 3-vessel runoff to the ankle with patency of the dorsalis pedis and plantar  arch. Right lower extremity arterial inflow: Extensive calcified atherosclerotic plaque within the common iliac artery. No hemodynamically significant stenosis. The internal iliac artery is patent at its origin. Right lower extremity arterial outflow: Surgical changes of right common femoral endarterectomy. 50% stenosis of the superficial femoral artery at its origin. Extensive calcified atherosclerotic plaque is seen throughout the superficial femoral artery with serial hemodynamically significant stenosis throughout its length of up to 50%. Profunda femoral artery is widely patent. Right lower extremity runoff: Extensive atherosclerotic plaque resulting in a 50-75% stenosis of the P1 segment of the popliteal artery and greater than 75% stenosis of the P2 segment and P3 segments of the popliteal artery. Extensive arteriosclerosis of the calf vasculature with 3-vessel runoff to the right  ankle and patency of the dorsalis pedis and plantar arch. Left lower extremity arterial inflow: Extensive calcified atherosclerotic disease plaque without hemodynamically significant stenosis. Internal iliac artery is patent. Left lower extremity arterial outflow: Wide patency of the common femoral artery. Extensive scattered calcified atherosclerotic plaque is seen throughout the superficial femoral artery, resulting in multifocal stenoses at 50%. Profunda femoral artery is widely patent. Left lower extremity arterial runoff: Scattered calcified atherosclerotic plaque resulting in a focal 50% stenosis of the P1 segment of the popliteal artery and multiple 50-75% stenosis of the P2 and P3 segments of the popliteal artery. Extensive arteriosclerosis of the calf vasculature. 3-vessel runoff to the ankle with patency of the dorsalis pedis and plantar arch. ABDOMEN AND PELVIS: LOWER CHEST: Visualized portion of the lower chest demonstrates no acute abnormality. LIVER: The liver is unremarkable. GALLBLADDER AND BILE DUCTS: Gallbladder is  unremarkable. No biliary ductal dilatation. SPLEEN: The spleen is unremarkable. PANCREAS: The pancreas is unremarkable. ADRENAL GLANDS: 8 mm benign right adrenal adenoma. No follow-up imaging is recommended. KIDNEYS, URETERS AND BLADDER: No stones in the kidneys or ureters. No hydronephrosis. No evidence of perinephric or periureteral stranding. Urinary bladder is unremarkable. GI AND Bowel: Severe sigmoid diverticulosis. No superimposed focal inflammatory change. The stomach, small bowel, and large bowel are otherwise unremarkable. The appendix is absent. REPRODUCTIVE: Reproductive organs are unremarkable. PERITONEUM AND RETROPERITONEUM: No ascites or free air. LYMPH NODES: No evidence of lymphadenopathy. BONES AND SOFT TISSUES: 2.2 x 2.7 cm high attenuation cystic collection within the right groin may represent a hematoma or abscess, postoperative seroma, or chronic hematoma. Infiltrative postoperative changes noted within the right groin superficial to the common femoral artery. Small bilateral knee effusions. Bilateral pedal edema. Asymmetric fatty atrophy of the right gastrocnemius musculature. Osseous structures are age-appropriate. No acute bone abnormality. No lytic or blastic bone lesion. Extensive coronary artery calcifications. IMPRESSION: 1. 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 2. 50-75% stenosis of the proximal left renal artery due to calcified atherosclerotic plaque; no aneurysm or dissection. 3. 50% stenosis of the inferior mesenteric artery at its origin due to calcified atherosclerotic plaque; distal vessel widely patent. Given the significant stenosis within the celiac axis , clinical correlation for signs and symptoms of chronic mesenteric ischemia is recommended. 4. Right lower extremity arterial inflow: Extensive calcified atherosclerotic plaque within the common iliac artery without hemodynamically  significant stenosis; internal iliac artery patent. 5. Right lower extremity arterial outflow: Surgical changes of right common femoral endarterectomy with 50% stenosis at the superficial femoral artery origin and serial stenoses up to 50% throughout its length; profunda femoral artery widely patent. 6. Right lower extremity arterial runoff: 50-75% stenosis of the P1 popliteal segment and greater than 75% stenosis of the P2 and P3 popliteal segments; three-vessel runoff to the ankle with patent dorsalis pedis and plantar arch. 7. Left lower extremity arterial inflow: Extensive calcified atherosclerotic plaque without hemodynamically significant stenosis; internal iliac artery patent. 8. Left lower extremity arterial outflow: Multifocal 50% stenoses in the superficial femoral artery; profunda femoral artery widely patent. 9. Left lower extremity arterial runoff: Focal 50% stenosis of the P1 popliteal segment and multiple 50-75% stenoses of the P2 and V3 popliteal segments; three-vessel runoff to the ankle with patent dorsalis pedis and plantar arch. 10. Severe sigmoid diverticulosis without superimposed inflammatory change. 11. Postoperative right groin fluid collection measuring 2.2 x 2.7 cm, differential includes hematoma, abscess, or seroma; correlate clinically and  consider targeted ultrasound if indicated. Electronically signed by: Dorethia Molt MD 10/18/2024 04:04 AM EST RP Workstation: [id number]   CT HEAD WO CONTRAST ( ) Result Date: 10/18/2024 EXAM: CT HEAD WITHOUT CONTRAST 10/18/2024 03:24:00 AM TECHNIQUE: CT of the head was performed without the administration of intravenous contrast. Automated exposure control, iterative reconstruction, and/or weight based adjustment of the mA/kV was utilized to reduce the radiation dose to as low as reasonably achievable. COMPARISON: None available. CLINICAL HISTORY: Neuro deficit, acute, stroke suspected FINDINGS: BRAIN AND VENTRICLES: No acute hemorrhage. No  evidence of acute infarct. Remote left basal ganglia infarct. Patchy white matter hypodensities compatible with chronic microvascular ischemic changes. No hydrocephalus. No extra-axial collection. No mass effect or midline shift. ORBITS: No acute abnormality. SINUSES: No acute abnormality. SOFT TISSUES AND SKULL: No acute soft tissue abnormality. No skull fracture. IMPRESSION: 1. No acute intracranial abnormality. 2. Remote left basal ganglia infarct and chronic microvascular ischemic change. Electronically signed by: Gilmore Molt MD 10/18/2024 03:31 AM EST RP Workstation: [id number]    There are no new results to review at this time.  Previous records (including but not limited to H&P, progress notes, nursing notes, TOC management) were reviewed in assessment of this patient.  Labs: CBC: Recent Labs  Lab 10/17/24 2210 10/19/24 0351  WBC 8.4 8.1  HGB 13.1 12.8  HCT 41.2 39.0  MCV 86.2 84.4  PLT 259 261   Basic Metabolic Panel: Recent Labs  Lab 10/17/24 2210 10/19/24 0351  NA 137 142  K 3.4* 3.1*  CL 95* 105  CO2 30 30  GLUCOSE 180* 39*  BUN 20 15  CREATININE 0.86 0.88  CALCIUM  9.0 8.7*   Liver Function Tests: Recent Labs  Lab 10/17/24 2210  AST 20  ALT 17  ALKPHOS 89  BILITOT 0.9  PROT 7.8  ALBUMIN  3.6   CBG: Recent Labs  Lab 10/20/24 1353 10/20/24 1704 10/20/24 2109 10/21/24 0605 10/21/24 1120  GLUCAP 207* 249* 286* 158* 211*    Scheduled Meds:  aspirin  EC  81 mg Oral Daily   atorvastatin   40 mg Oral Daily   clopidogrel   75 mg Oral Daily   enoxaparin  (LOVENOX ) injection  40 mg Subcutaneous Q24H   insulin  aspart  0-5 Units Subcutaneous QHS   insulin  aspart  0-9 Units Subcutaneous TID WC   insulin  glargine  10 Units Subcutaneous Daily   levothyroxine   50 mcg Oral Q0600   Continuous Infusions: PRN Meds:.acetaminophen  **OR** acetaminophen  (TYLENOL ) oral liquid 160 mg/5 mL **OR** acetaminophen , meclizine , senna-docusate  Family Communication: Not at  bedside  Disposition: Status is: Inpatient Remains inpatient appropriate because: CVA     Time spent: 32 minutes  Length of inpatient stay: 3 days  Author: Carliss LELON Canales, DO 10/21/2024 1:23 PM  For on call review [email or web address].

## 2024-10-21 NOTE — Care Management Important Message (Signed)
 Important Message  Patient Details  Name: Sara [name] MRN: [id number] Date of Birth: 1946/01/06   Important Message Given:  Yes - Medicare IM     Claretta Deed 10/21/2024, 2:55 PM

## 2024-10-21 NOTE — Plan of Care (Signed)

## 2024-10-21 NOTE — Progress Notes (Signed)
°  Inpatient Rehabilitation Admissions Coordinator   Met with patient at bedside for rehab assessment using [facility] interpreter Nadya # [phone].  We discussed goals and expectations of a possible CIR admit. She is the caregiver for her spouse and her daughter is caring for him while she is admitted. She states she is a lot different from her normal baseline and is hopeful for CIR admit and then return home at independent level as before. Dr Lorilee to consult and then I will begin insurance Auth with [facility] Medicare for possible CIR admit pending approval. Please call me with any questions.   Heron Leavell, RN, MSN Rehab Admissions Coordinator [phone]

## 2024-10-21 NOTE — Progress Notes (Signed)
 Physical Therapy Treatment Patient Details Name: Sara [name] MRN: [id number] DOB: 1945-11-18 Today's Date: 10/21/2024   History of Present Illness Pt is a 78 y.o. female presenting with RLE weakness and numbness. 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 PMH: HTN, HLD, PVD s/p R femoral endarterectomy 06/2024, DMII, hypothyroidism, obesity.    PT Comments  Pt with fair tolerance to treatment today. Pt able to ambulate in hallway with RW CGA/Min A. Cues for proximity to RW as pt was noted to stand towards the R side of the RW. Pt noted to drift to the R today when ambulating and required up to Min A to navigate RW around obstacles and when turning. Pt also required several standing rest breaks due to knee pain and fatigue. No change in DC/DME recs at this time. PT will continue to follow.      If plan is discharge home, recommend the following: A little help with walking and/or transfers;A little help with bathing/dressing/bathroom;Assistance with cooking/housework;Assist for transportation;Help with stairs or ramp for entrance;Supervision due to cognitive status   Can travel by private vehicle        Equipment Recommendations  Rolling walker (2 wheels)    Recommendations for Other Services       Precautions / Restrictions Precautions Precautions: Fall Recall of Precautions/Restrictions: Intact Restrictions Weight Bearing Restrictions Per Provider Order: No     Mobility  Bed Mobility               General bed mobility comments: received in chair    Transfers Overall transfer level: Needs assistance Equipment used: Rolling walker (2 wheels) Transfers: Sit to/from Stand Sit to Stand: Contact guard assist           General transfer comment: Cues for hand placement    Ambulation/Gait Ambulation/Gait assistance: Contact guard assist, Min assist Gait Distance (Feet): 110 Feet Assistive device: Rolling walker (2  wheels) Gait Pattern/deviations: Trunk flexed, Decreased stride length, Step-through pattern, Drifts right/left Gait velocity: decreased     General Gait Details: Cues for proximity to RW as pt was noted to stand towards the R side of the RW. Pt noted to drift to the R today when ambulating and required up to Min A to navigate RW around obstacles and when turning. Pt also required several standing rest breaks due to knee pain and fatigue.   Stairs             Wheelchair Mobility     Tilt Bed    Modified Rankin (Stroke Patients Only) Modified Rankin (Stroke Patients Only) Pre-Morbid Rankin Score: No symptoms Modified Rankin: No significant disability     Balance Overall balance assessment: Needs assistance Sitting-balance support: Bilateral upper extremity supported, Feet supported Sitting balance-Leahy Scale: Good     Standing balance support: Bilateral upper extremity supported, During functional activity Standing balance-Leahy Scale: Poor Standing balance comment: Reliant on RW                            Communication Communication Communication: Other (comment);Impaired Factors Affecting Communication: Non - English speaking, interpreter not available (In person interpreter Raquel utilized)  Cognition Arousal: Alert Behavior During Therapy: WFL for tasks assessed/performed   PT - Cognitive impairments: Problem solving, Safety/Judgement, Sequencing, Initiation                       PT - Cognition Comments:  Some slow processing noted however likely due to language barrier. Following commands: Impaired Following commands impaired: Follows one step commands inconsistently, Follows one step commands with increased time    Cueing Cueing Techniques: Verbal cues, Tactile cues  Exercises      General Comments General comments (skin integrity, edema, etc.): VSS      Pertinent Vitals/Pain Pain Assessment Pain Assessment: 0-10 Pain Score: 8   Pain Location: L knee pain Pain Descriptors / Indicators: Sore, Aching, Grimacing, Discomfort Pain Intervention(s): Monitored during session, Limited activity within patient's tolerance, Repositioned    Home Living                          Prior Function            PT Goals (current goals can now be found in the care plan section) Progress towards PT goals: Progressing toward goals    Frequency    Min 2X/week      PT Plan      Co-evaluation              AM-PAC PT 6 Clicks Mobility   Outcome Measure  Help needed turning from your back to your side while in a flat bed without using bedrails?: A Little Help needed moving from lying on your back to sitting on the side of a flat bed without using bedrails?: A Little Help needed moving to and from a bed to a chair (including a wheelchair)?: A Little Help needed standing up from a chair using your arms (e.g., wheelchair or bedside chair)?: A Little Help needed to walk in hospital room?: A Little Help needed climbing 3-5 steps with a railing? : A Little 6 Click Score: 18    End of Session Equipment Utilized During Treatment: Gait belt Activity Tolerance: Patient tolerated treatment well Patient left: in chair;with call bell/phone within reach;with chair alarm set;with nursing/sitter in room Nurse Communication: Mobility status PT Visit Diagnosis: Other abnormalities of gait and mobility (R26.89)     Time: 9060-9040 PT Time Calculation (min) (ACUTE ONLY): 20 min  Charges:    $Gait Training: 8-22 mins PT General Charges $$ ACUTE PT VISIT: 1 Visit                     Sueellen NOVAK, PT, DPT Acute Rehab Services [phone]    Yareni Creps 10/21/2024, 12:10 PM

## 2024-10-21 NOTE — Consult Note (Signed)
 Physical Medicine and Rehabilitation Consult Reason for Consult: CVA Referring Physician: Carliss Canales, DO   HPI: Sara [name] is a 78 y.o. female who presents with RLE weakness and numbness. MRI brain shows an acute lacunar infarct in the L pons and remote L basal ganglia infarct, PMH is significant for HTN, HLD, PVR s/p right femoral endarterectomy 06/2024, DM 2, hypothyroidism, obesity. Physical Medicine & Rehabilitation was consulted to assess candidacy for CIR.  She is currently CG for transfers and CG/MinA for ambulating 110 feet with RW.    Home: Home Living Family/patient expects to be discharged to:: Private residence Living Arrangements: Spouse/significant other Available Help at Discharge: Family, Available 24 hours/day Type of Home: House Home Access: Stairs to enter [employer] of Steps: 1 Entrance Stairs-Rails: None Home Layout: One level Bathroom Shower/Tub: Engineer, [employer] Systems: Standard Bathroom Accessibility: Yes Home Equipment: BSC/3in1, Shower seat, Rollator (4 wheels) Additional Comments: Husband is blind pt reports is his primary caretaker, assists with all ADLs  Functional History: Prior Function Prior Level of Function : Independent/Modified Independent, Driving Mobility Comments: Pt reports Mod I rollator since last week. Prior to that she was Mod I furniture surfing ADLs Comments: Ind with most ADLs Functional Status:  Mobility: Bed Mobility General bed mobility comments: received in chair Transfers Overall transfer level: Needs assistance Equipment used: Rolling walker (2 wheels) Transfers: Sit to/from Stand Sit to Stand: Contact guard assist General transfer comment: Cues for hand placement Ambulation/Gait Ambulation/Gait assistance: Contact guard assist, Min assist Gait Distance (Feet): 110 Feet Assistive device: Rolling walker (2 wheels) Gait Pattern/deviations: Trunk flexed, Decreased stride length,  Step-through pattern, Drifts right/left General Gait Details: Cues for proximity to RW as pt was noted to stand towards the R side of the RW. Pt noted to drift to the R today when ambulating and required up to Min A to navigate RW around obstacles and when turning. Pt also required several standing rest breaks due to knee pain and fatigue. Gait velocity: decreased Stairs: Yes Stairs assistance: Min assist Stair Management: Two rails, Step to pattern, Forwards Number of Stairs: 1 General stair comments: Cues for sequencing. Pt very slow to turn around on steps.    ADL: ADL Overall ADL's : Needs assistance/impaired Eating/Feeding: Set up, Sitting Grooming: Minimal assistance, Standing, Wash/dry hands Upper Body Bathing: Set up, Sitting Lower Body Bathing: Minimal assistance, Sit to/from stand Upper Body Dressing : Set up, Sitting Lower Body Dressing: Moderate assistance, Sit to/from stand Toilet Transfer: Minimal assistance, Ambulation, Rolling walker (2 wheels) Toileting- Clothing Manipulation and Hygiene: Set up, Sitting/lateral lean Functional mobility during ADLs: Minimal assistance, Rolling walker (2 wheels)  Cognition: Cognition Orientation Level: Oriented X4 Cognition Arousal: Alert Behavior During Therapy: WFL for tasks assessed/performed   ROS +anxiety about her husband's condition as she is his primary caretaker 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/2022   DM (diabetes mellitus) (HCC)    DOE (dyspnea on exertion) 05/24/2022   Essential hypertension 04/25/2022   Hyperglycemia due to type 2  diabetes mellitus (HCC) 05/24/2022   Hypertension with renal disease  Hypothyroidism    Long term (current) use of insulin  (HCC)    Menopause 05/24/2022   Mixed hyperlipidemia    Morbid obesity due to excess calories (HCC)    Obesity (BMI 35.0-39.9 without comorbidity) 05/24/2022   Postmenopausal bleeding 05/30/2022   PVD (peripheral vascular disease)    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) 05/24/2022   Stroke (HCC) 05/24/2022   Transient arterial retinal occlusion 05/24/2022   Type 2 diabetes mellitus with hyperglycemia (HCC)    Type 2 diabetes mellitus with other specified complication (HCC) 05/24/2022   Type 2 diabetes mellitus with peripheral angiopathy (HCC) 05/24/2022   Past Surgical History:  Procedure Laterality Date   APPENDECTOMY     as a teenager   CATARACT EXTRACTION W/ INTRAOCULAR LENS IMPLANT Bilateral    CESAREAN SECTION  1976   CESAREAN SECTION  1979   COLONOSCOPY     ENDARTERECTOMY FEMORAL Right 06/16/2024   Procedure: RIGHT FEMORAL ENDARTERECTOMY;  Surgeon: Pearline Norman RAMAN, MD;  Location: [facility] OR;  Service: Vascular;  Laterality: Right;   PATCH ANGIOPLASTY Right 06/16/2024   Procedure: ANGIOPLASTY, USING GEORGE BIOLOGIC PATCH;  Surgeon: Pearline Norman RAMAN, MD;  Location: [facility] OR;  Service: Vascular;  Laterality: Right;   polypectomy  2023   Uterine   TUBAL LIGATION  1979   with C-Section   Family History  Problem Relation Age of Onset   Heart disease Mother    Diabetes Brother    Diabetes Maternal Uncle    Colon cancer Neg Hx    Esophageal cancer Neg Hx    Stomach cancer Neg Hx    Rectal cancer Neg Hx    Social History:  reports that she quit smoking about 20 years ago. Her smoking use included cigarettes. She has never used smokeless tobacco. She reports current alcohol use. She reports that she does not use drugs. Allergies: No Known Allergies Medications Prior to Admission   Medication Sig Dispense Refill   aspirin  EC 81 MG tablet Take 81 mg by mouth daily. Swallow whole.     atorvastatin  (LIPITOR) 40 MG tablet Take 40 mg by mouth daily.     EUTHYROX  50 MCG tablet Take 50 mcg by mouth daily before breakfast.     losartan -hydrochlorothiazide  (HYZAAR) 50-12.5 MG tablet Take 2 tablets by mouth daily.     metFORMIN  (GLUCOPHAGE ) 1000 MG tablet Take 1,000 mg by mouth 2 (two) times daily with a meal.     insulin  lispro protamine -lispro (HUMALOG  MIX 75/25) (75-25) 100 UNIT/ML SUSP injection Inject 0.15 mLs (15 Units total) into the skin daily with breakfast. (Patient not taking: Reported on 10/19/2024) 10 mL 11   insulin  NPH-regular Human (HUMULIN  70/30) (70-30) 100 UNIT/ML injection Inject 30 units into the skin 2(two) times daily with a meal. 30 units in the morning and 50 units at night (Patient not taking: Reported on 10/19/2024) 10 mL 11   meclizine  (ANTIVERT ) 12.5 MG tablet Take 1 tablet (12.5 mg total) by mouth 3 (three) times daily as needed for dizziness. (Patient not taking: Reported on 10/19/2024) 30 tablet 0     Blood pressure 139/62, pulse 66, temperature 98.1 F (36.7 C), temperature source Oral, resp. rate 18, height 5' 2 (1.575 m), weight 93.4 kg, SpO2 96%. Physical Exam  Results for orders placed or performed during the hospital encounter of 10/17/24 (from the past 24 hours)  Glucose, capillary     Status: Abnormal   Collection Time:  10/20/24  1:53 PM  Result Value Ref Range   Glucose-Capillary 207 (H) 70 - 99 mg/dL  Glucose, capillary     Status: Abnormal   Collection Time: 10/20/24  5:04 PM  Result Value Ref Range   Glucose-Capillary 249 (H) 70 - 99 mg/dL  Glucose, capillary     Status: Abnormal   Collection Time: 10/20/24  9:09 PM  Result Value Ref Range   Glucose-Capillary 286 (H) 70 - 99 mg/dL   Comment 1 Notify RN    Comment 2 Document in Chart   Glucose, capillary     Status: Abnormal   Collection Time: 10/21/24  6:05 AM  Result Value Ref  Range   Glucose-Capillary 158 (H) 70 - 99 mg/dL   Comment 1 Notify RN    Comment 2 Document in Chart   Glucose, capillary     Status: Abnormal   Collection Time: 10/21/24 11:20 AM  Result Value Ref Range   Glucose-Capillary 211 (H) 70 - 99 mg/dL   No results found.  Assessment/Plan: Diagnosis: CVA Does the need for close, 24 hr/day medical supervision in concert with the patient's rehab needs make it unreasonable for this patient to be served in a less intensive setting? Yes Co-Morbidities requiring supervision/potential complications:  1) class 2 obesity: provide dietary education 2) L knee pain: consider voltaren gel 3) HTN: continue to monitor BP 4) DM 2: continue insuline 5) HLD: continue statin Due to bladder management, bowel management, safety, skin/wound care, disease management, medication administration, pain management, and patient education, does the patient require 24 hr/day rehab nursing? Yes Does the patient require coordinated care of a physician, rehab nurse, therapy disciplines of PT, OT to address physical and functional deficits in the context of the above medical diagnosis(es)? Yes Addressing deficits in the following areas: balance, endurance, locomotion, strength, transferring, bowel/bladder control, bathing, dressing, feeding, grooming, toileting, and psychosocial support Can the patient actively participate in an intensive therapy program of at least 3 hrs of therapy per day at least 5 days per week? Yes The potential for patient to make measurable gains while on inpatient rehab is excellent Anticipated functional outcomes upon discharge from inpatient rehab are modified independent  with PT, modified independent with OT, independent with SLP. Estimated rehab length of stay to reach the above functional goals is: 5-7 days Anticipated discharge destination: Home Overall Rehab/Functional Prognosis: excellent  POST ACUTE RECOMMENDATIONS: This patient's condition  is appropriate for continued rehabilitative care in the following setting: CIR Patient has agreed to participate in recommended program. Yes Note that insurance prior authorization may be required for reimbursement for recommended care.   I have personally performed a face to face diagnostic evaluation of this patient. Additionally, I have examined the patient's medical record including any pertinent labs and radiographic images.    Thanks,  Sven SHAUNNA Elks, MD 10/21/2024

## 2024-10-22 ENCOUNTER — Ambulatory Visit: Admitting: Podiatry

## 2024-10-22 DIAGNOSIS — E039 Hypothyroidism, unspecified: Secondary | ICD-10-CM | POA: Diagnosis not present

## 2024-10-22 DIAGNOSIS — E1165 Type 2 diabetes mellitus with hyperglycemia: Secondary | ICD-10-CM | POA: Diagnosis not present

## 2024-10-22 DIAGNOSIS — Z794 Long term (current) use of insulin: Secondary | ICD-10-CM | POA: Diagnosis not present

## 2024-10-22 LAB — GLUCOSE, CAPILLARY
Glucose-Capillary: 132 mg/dL — ABNORMAL HIGH (ref 70–99)
Glucose-Capillary: 200 mg/dL — ABNORMAL HIGH (ref 70–99)
Glucose-Capillary: 207 mg/dL — ABNORMAL HIGH (ref 70–99)
Glucose-Capillary: 244 mg/dL — ABNORMAL HIGH (ref 70–99)

## 2024-10-22 MED ORDER — OXYCODONE HCL 5 MG PO TABS: 5.0000 mg | ORAL_TABLET | Freq: Four times a day (QID) | ORAL | Status: DC | PRN

## 2024-10-22 MED ORDER — POTASSIUM CHLORIDE CRYS ER 20 MEQ PO TBCR
40.0000 meq | EXTENDED_RELEASE_TABLET | ORAL | Status: AC
  Administered 2024-10-22 (×2): 40 meq via ORAL
  Filled 2024-10-22 (×2): qty 2

## 2024-10-22 MED ORDER — LOSARTAN POTASSIUM-HCTZ 50-12.5 MG PO TABS: 1.0000 | ORAL_TABLET | Freq: Every day | ORAL | Status: DC

## 2024-10-22 MED ORDER — HYDROCHLOROTHIAZIDE 12.5 MG PO TABS
12.5000 mg | ORAL_TABLET | Freq: Every day | ORAL | Status: DC
  Administered 2024-10-22 – 2024-10-23 (×2): 12.5 mg via ORAL
  Filled 2024-10-22 (×2): qty 1

## 2024-10-22 MED ORDER — LOSARTAN POTASSIUM 50 MG PO TABS
50.0000 mg | ORAL_TABLET | Freq: Every day | ORAL | Status: DC
  Administered 2024-10-22 – 2024-10-23 (×2): 50 mg via ORAL
  Filled 2024-10-22 (×2): qty 1

## 2024-10-22 NOTE — TOC Progression Note (Signed)
 Transition of Care [facility]) - Progression Note    Patient Details  Name: Sara [name] MRN: [id number] Date of Birth: 06-10-1946  [facility] CM/SW Contact  Andrez JULIANNA George, RN Phone Number: 10/22/2024, 2:20 PM  Clinical Narrative:     Plan is for CIR pending insurance approval. IP Care management following.  Expected Discharge Plan: IP Rehab Facility Barriers to Discharge: Continued Medical Work up               Expected Discharge Plan and Services   Discharge Planning Services: CM Consult   Living arrangements for the past 2 months: Apartment                                       Social Drivers of Health (SDOH) Interventions SDOH Screenings   Food Insecurity: Food Insecurity Present (10/18/2024)  Housing: High Risk (10/18/2024)  Transportation Needs: Unmet Transportation Needs (10/18/2024)  Utilities: Not At Risk (10/18/2024)  Social Connections: Moderately Isolated (10/18/2024)  Tobacco Use: Medium Risk (10/17/2024)    Readmission Risk Interventions     No data to display

## 2024-10-22 NOTE — Progress Notes (Addendum)
 [facility]  PROGRESS NOTE  Sara [name] [name] FMW:[id number] DOB: 11/25/1945 DOA: 10/17/2024 PCP: Melonie Colonel, Mikel HERO, MD   Brief HPI:   78 y.o. female with medical history significant of hypertension, hyperlipidemia, peripheral vascular disease s/p right femoral endarterectomy in 06/2024, diabetes mellitus type 2, hypothyroidism,  and obesity presents with right leg weakness and numbness     Assessment/Plan:    Acute pontine CVA Presented with right sided weakness which appears to have mostly resolved.  MRI noting acute nonhemorrhagic lacunar infarct in the left pons.   Neurology was consulted Recommended DAPT with aspirin  and Plavix  for 3 weeks then transition to Plavix  daily thereafter.  CT angio noting less than 20% stenosis bilaterally with no other frank occlusion or stenosis noted.   Echo noting no PFO.  Evaluated by PT/OT.   Patient pending CIR.   Uncontrolled insulin -dependent diabetes mellitus  A1c greater than 8 suggesting poor control.   Current home regimen includes 70/30 and metformin .  Currently adding insulin  glargine 10 units (not on 7030 while inpatient).   Insulin  sliding scale on board. CBG well-controlled   Hypertension  Resume losartan /hydrochlorothiazide , 1 tablet daily instead of 2 tablets daily which patient takes at home   Hyperlipidemia  Atorvastatin  on board.   Hypokalemia Potassium was 3.1 on 12/8 2 dose of K-Dur 40 mg p.o. given Follow BMP today     DVT prophylaxis: Lovenox   Medications     aspirin  EC  81 mg Oral Daily   atorvastatin   40 mg Oral Daily   clopidogrel   75 mg Oral Daily   enoxaparin  (LOVENOX ) injection  40 mg Subcutaneous Q24H   losartan   50 mg Oral Daily   And   hydrochlorothiazide   12.5 mg Oral Daily   insulin  aspart  0-5 Units Subcutaneous QHS   insulin  aspart  0-9 Units Subcutaneous TID WC   insulin  glargine  10 Units Subcutaneous Daily   levothyroxine   50 mcg Oral Q0600   polyethylene glycol  17 g Oral  Daily     Data Reviewed:   CBG:  Recent Labs  Lab 10/21/24 1120 10/21/24 1633 10/21/24 2113 10/22/24 0612 10/22/24 1115  GLUCAP 211* 210* 208* 132* 200*    SpO2: 94 %    Vitals:   10/22/24 0000 10/22/24 0410 10/22/24 0850 10/22/24 1146  BP: (!) 165/57 (!) 187/69 138/65 (!) 164/64  Pulse: 60 (!) 57 66 64  Resp: 18 20 20 18   Temp: 98.3 F (36.8 C) 98 F (36.7 C) 98.2 F (36.8 C) 98.4 F (36.9 C)  TempSrc: Oral Oral Oral Oral  SpO2: 98% 94% 96% 94%  Weight:      Height:          Data Reviewed:  Basic Metabolic Panel: Recent Labs  Lab 10/17/24 2210 10/19/24 0351  NA 137 142  K 3.4* 3.1*  CL 95* 105  CO2 30 30  GLUCOSE 180* 39*  BUN 20 15  CREATININE 0.86 0.88  CALCIUM  9.0 8.7*    CBC: Recent Labs  Lab 10/17/24 2210 10/19/24 0351  WBC 8.4 8.1  HGB 13.1 12.8  HCT 41.2 39.0  MCV 86.2 84.4  PLT 259 261    LFT Recent Labs  Lab 10/17/24 2210  AST 20  ALT 17  ALKPHOS 89  BILITOT 0.9  PROT 7.8  ALBUMIN  3.6     Antibiotics: Anti-infectives (From admission, onward)    None        CONSULTS neurology  Code Status: Full code  Family  Communication: No family at bedside     Subjective   Denies any complaints   Objective    Physical Examination:   General-appears in no acute distress Heart-S1-S2, regular, no murmur auscultated Lungs-clear to auscultation bilaterally, no wheezing or crackles auscultated Abdomen-soft, nontender, no organomegaly Extremities-no edema in the lower extremities Neuro-alert, oriented x3, no focal deficit noted            [name] [name]   [facility] If 7PM-7AM, please contact night-coverage at [email or web address], Office  [phone]   10/22/2024, 3:55 PM  LOS: 4 days

## 2024-10-22 NOTE — Progress Notes (Signed)
 Occupational Therapy Treatment Patient Details Name: Sara [name] MRN: [id number] DOB: [birth year] Today's Date: 10/22/2024   History of present illness Pt is a 78 y.o. female presenting with RLE weakness and numbness. 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 PMH: HTN, HLD, PVD s/p R femoral endarterectomy 06/2024, DMII, hypothyroidism, obesity.   OT comments  Patient received in supine and eager to participate. Patient demonstrating good gains with OT treatment with min assist for bed mobility and CGA for sit to stands, transfers, standing at sink sink for grooming and toilet transfers. Patient continues to require mod assist for donning socks due to difficulty achieving figure 4.  Patient will benefit from intensive inpatient follow-up therapy, >3 hours/day.  Acute OT to continue to follow to address established goals to facilitate DC to next venue of care.        If plan is discharge home, recommend the following:  A little help with walking and/or transfers;A little help with bathing/dressing/bathroom;A lot of help with bathing/dressing/bathroom;Assistance with cooking/housework;Assist for transportation;Help with stairs or ramp for entrance   Equipment Recommendations  Other (comment) (defer)    Recommendations for Other Services      Precautions / Restrictions Precautions Precautions: Fall Recall of Precautions/Restrictions: Intact Restrictions Weight Bearing Restrictions Per Provider Order: No       Mobility Bed Mobility Overal bed mobility: Needs Assistance Bed Mobility: Supine to Sit     Supine to sit: Min assist     General bed mobility comments: assistance to raise trunk    Transfers Overall transfer level: Needs assistance Equipment used: Rolling walker (2 wheels) Transfers: Sit to/from Stand Sit to Stand: Contact guard assist           General transfer comment: cues for hand placement and CGA for  balance     Balance Overall balance assessment: Needs assistance Sitting-balance support: Bilateral upper extremity supported, Feet supported Sitting balance-Leahy Scale: Good Sitting balance - Comments: EOB   Standing balance support: Bilateral upper extremity supported, During functional activity Standing balance-Leahy Scale: Poor Standing balance comment: Reliant on RW                           ADL either performed or assessed with clinical judgement   ADL Overall ADL's : Needs assistance/impaired Eating/Feeding: Set up;Sitting   Grooming: Wash/dry hands;Wash/dry face;Oral care;Contact guard assist;Standing Grooming Details (indicate cue type and reason): at sink Upper Body Bathing: Contact guard assist;Standing       Upper Body Dressing : Set up;Sitting Upper Body Dressing Details (indicate cue type and reason): gown for back Lower Body Dressing: Moderate assistance;Sit to/from stand Lower Body Dressing Details (indicate cue type and reason): socks Toilet Transfer: Contact guard assist;Ambulation;Regular Toilet;Rolling walker (2 wheels) Toilet Transfer Details (indicate cue type and reason): cues for safety Toileting- Clothing Manipulation and Hygiene: Set up;Sitting/lateral lean              Extremity/Trunk Assessment              Vision       Perception     Praxis     Communication Communication Communication: Other (comment);Impaired Factors Affecting Communication: Non - English speaking, interpreter not available (able to communicate in English but not first language)   Cognition Arousal: Alert Behavior During Therapy: [facility] for tasks assessed/performed Cognition: Difficult to assess, Cognition impaired Difficult to assess due to: Non-English speaking (able to communicate  in simple English)   Awareness: Online awareness impaired Memory impairment (select all impairments): Short-term memory Attention impairment (select first level of  impairment): Selective attention Executive functioning impairment (select all impairments): Problem solving, Organization, Sequencing, Reasoning OT - Cognition Comments: able to answer orientation questions correctly                 Following commands: Impaired Following commands impaired: Follows one step commands inconsistently, Follows one step commands with increased time      Cueing   Cueing Techniques: Verbal cues, Tactile cues  Exercises      Shoulder Instructions       General Comments VSS    Pertinent Vitals/ Pain       Pain Assessment Pain Assessment: Faces Faces Pain Scale: Hurts a little bit Pain Location: L knee pain Pain Descriptors / Indicators: Sore, Aching, Grimacing, Discomfort Pain Intervention(s): Limited activity within patient's tolerance, Monitored during session, Repositioned  Home Living                                          Prior Functioning/Environment              Frequency  Min 2X/week        Progress Toward Goals  OT Goals(current goals can now be found in the care plan section)  Progress towards OT goals: Progressing toward goals  Acute Rehab OT Goals Patient Stated Goal: to go home OT Goal Formulation: With patient Time For Goal Achievement: 11/02/24 Potential to Achieve Goals: Good ADL Goals Pt Will Perform Grooming: with modified independence;standing Pt Will Perform Lower Body Dressing: with modified independence;sit to/from stand Pt Will Transfer to Toilet: with modified independence;ambulating Additional ADL Goal #1: pt will follow 3 step command without cues  Plan      Co-evaluation                 AM-PAC OT 6 Clicks Daily Activity     Outcome Measure   Help from another person eating meals?: None Help from another person taking care of personal grooming?: A Little Help from another person toileting, which includes using toliet, bedpan, or urinal?: A Little Help from another  person bathing (including washing, rinsing, drying)?: A Little Help from another person to put on and taking off regular upper body clothing?: A Little Help from another person to put on and taking off regular lower body clothing?: A Lot 6 Click Score: 18    End of Session Equipment Utilized During Treatment: Gait belt;Rolling walker (2 wheels)  OT Visit Diagnosis: Unsteadiness on feet (R26.81);Muscle weakness (generalized) (M62.81);Other symptoms and signs involving cognitive function;Ataxia, unspecified (R27.0);Low vision, both eyes (H54.2)   Activity Tolerance Patient tolerated treatment well   Patient Left in chair;with call bell/phone within reach;with chair alarm set   Nurse Communication Mobility status        Time: 9255-9185 OT Time Calculation (min): 30 min  Charges: OT General Charges $OT Visit: 1 Visit OT Treatments $Self Care/Home Management : 23-37 mins  Dick Laine, OTA Acute Rehabilitation Services  Office [phone]   Jeb LITTIE Laine 10/22/2024, 9:43 AM

## 2024-10-23 ENCOUNTER — Other Ambulatory Visit (HOSPITAL_BASED_OUTPATIENT_CLINIC_OR_DEPARTMENT_OTHER): Admitting: Radiology

## 2024-10-23 DIAGNOSIS — I639 Cerebral infarction, unspecified: Secondary | ICD-10-CM | POA: Diagnosis not present

## 2024-10-23 DIAGNOSIS — E876 Hypokalemia: Secondary | ICD-10-CM | POA: Diagnosis not present

## 2024-10-23 DIAGNOSIS — I6300 Cerebral infarction due to thrombosis of unspecified precerebral artery: Secondary | ICD-10-CM | POA: Diagnosis not present

## 2024-10-23 DIAGNOSIS — I129 Hypertensive chronic kidney disease with stage 1 through stage 4 chronic kidney disease, or unspecified chronic kidney disease: Secondary | ICD-10-CM | POA: Diagnosis not present

## 2024-10-23 LAB — BASIC METABOLIC PANEL WITH GFR
Anion gap: 7 (ref 5–15)
BUN: 21 mg/dL (ref 8–23)
CO2: 27 mmol/L (ref 22–32)
Calcium: 8.9 mg/dL (ref 8.9–10.3)
Chloride: 102 mmol/L (ref 98–111)
Creatinine, Ser: 1.04 mg/dL — ABNORMAL HIGH (ref 0.44–1.00)
GFR, Estimated: 56 mL/min — ABNORMAL LOW (ref >60)
Glucose, Bld: 166 mg/dL — ABNORMAL HIGH (ref 70–99)
Potassium: 4.4 mmol/L (ref 3.5–5.1)
Sodium: 136 mmol/L (ref 135–145)

## 2024-10-23 LAB — GLUCOSE, CAPILLARY
Glucose-Capillary: 156 mg/dL — ABNORMAL HIGH (ref 70–99)
Glucose-Capillary: 208 mg/dL — ABNORMAL HIGH (ref 70–99)

## 2024-10-23 MED ORDER — LOSARTAN POTASSIUM-HCTZ 50-12.5 MG PO TABS: 1.0000 | ORAL_TABLET | Freq: Every day | ORAL | 3 refills | Status: AC

## 2024-10-23 MED ORDER — CLOPIDOGREL BISULFATE 75 MG PO TABS: 75.0000 mg | ORAL_TABLET | Freq: Every day | ORAL | 3 refills | Status: AC

## 2024-10-23 MED ORDER — HUMULIN 70/30 (70-30) 100 UNIT/ML ~~LOC~~ SUSP: 15.0000 [IU] | Freq: Two times a day (BID) | SUBCUTANEOUS | 1 refills | Status: AC

## 2024-10-23 MED ORDER — ASPIRIN 81 MG PO TBEC: 81.0000 mg | DELAYED_RELEASE_TABLET | Freq: Every day | ORAL | 0 refills | Status: AC

## 2024-10-23 MED ORDER — AMLODIPINE BESYLATE 5 MG PO TABS: 5.0000 mg | ORAL_TABLET | Freq: Every day | ORAL | 11 refills | Status: AC

## 2024-10-23 NOTE — Progress Notes (Signed)
 Physical Therapy Treatment Patient Details Name: Sara [name] MRN: [id number] DOB: 1946/10/08 Today's Date: 10/23/2024   History of Present Illness Pt is a 78 y.o. female presenting with RLE weakness and numbness. 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 PMH: HTN, HLD, PVD s/p R femoral endarterectomy 06/2024, DMII, hypothyroidism, obesity.    PT Comments  Pt tolerated treatment well today. Pt today reported less knee pain today compared to previous session and was able to ambulate in the hallway with RW and navigate steps CGA. CIR signed off and DC recs updated to HHPT. PT will continue to follow.     If plan is discharge home, recommend the following: A little help with walking and/or transfers;A little help with bathing/dressing/bathroom;Assistance with cooking/housework;Assist for transportation;Help with stairs or ramp for entrance;Supervision due to cognitive status   Can travel by private vehicle        Equipment Recommendations  Rolling walker (2 wheels)    Recommendations for Other Services       Precautions / Restrictions Precautions Precautions: Fall Recall of Precautions/Restrictions: Intact Restrictions Weight Bearing Restrictions Per Provider Order: No     Mobility  Bed Mobility               General bed mobility comments: received in chair    Transfers Overall transfer level: Modified independent Equipment used: Rolling walker (2 wheels) Transfers: Sit to/from Stand Sit to Stand: Modified independent (Device/Increase time)           General transfer comment: Cues for hand placement    Ambulation/Gait Ambulation/Gait assistance: Contact guard assist Gait Distance (Feet): 120 Feet Assistive device: Rolling walker (2 wheels) Gait Pattern/deviations: Trunk flexed, Decreased stride length, Step-through pattern, Drifts right/left Gait velocity: decreased     General Gait Details: Cues for  proximity to RW as pt was noted to stand towards the R side of the RW. no standing rest breaks today compared to previous session.   Stairs Stairs: Yes Stairs assistance: Contact guard assist Stair Management: Two rails, Step to pattern, Forwards Number of Stairs: 2 General stair comments: Cues for sequencing. Pt very slow to turn around on steps.   Wheelchair Mobility     Tilt Bed    Modified Rankin (Stroke Patients Only) Modified Rankin (Stroke Patients Only) Pre-Morbid Rankin Score: No symptoms Modified Rankin: No significant disability     Balance Overall balance assessment: Needs assistance Sitting-balance support: Bilateral upper extremity supported, Feet supported Sitting balance-Leahy Scale: Good     Standing balance support: Bilateral upper extremity supported, During functional activity Standing balance-Leahy Scale: Poor Standing balance comment: Reliant on RW                            Communication Communication Communication: Other (comment);Impaired Factors Affecting Communication: Non - English speaking, interpreter not available (In person interpreter Raquel utilized)  Cognition Arousal: Alert Behavior During Therapy: WFL for tasks assessed/performed   PT - Cognitive impairments: Problem solving, Safety/Judgement, Sequencing, Initiation                       PT - Cognition Comments: Some slow processing noted however likely due to language barrier. Following commands: Impaired Following commands impaired: Follows one step commands inconsistently, Follows one step commands with increased time    Cueing Cueing Techniques: Verbal cues, Tactile cues  Exercises      General Comments General  comments (skin integrity, edema, etc.): VSS      Pertinent Vitals/Pain Pain Assessment Pain Assessment: Faces Faces Pain Scale: Hurts a little bit Pain Location: L knee pain Pain Descriptors / Indicators: Sore, Aching, Grimacing,  Discomfort Pain Intervention(s): Monitored during session    Home Living                          Prior Function            PT Goals (current goals can now be found in the care plan section) Progress towards PT goals: Progressing toward goals    Frequency    Min 2X/week      PT Plan      Co-evaluation              AM-PAC PT 6 Clicks Mobility   Outcome Measure  Help needed turning from your back to your side while in a flat bed without using bedrails?: A Little Help needed moving from lying on your back to sitting on the side of a flat bed without using bedrails?: A Little Help needed moving to and from a bed to a chair (including a wheelchair)?: A Little Help needed standing up from a chair using your arms (e.g., wheelchair or bedside chair)?: A Little Help needed to walk in hospital room?: A Little Help needed climbing 3-5 steps with a railing? : A Little 6 Click Score: 18    End of Session Equipment Utilized During Treatment: Gait belt Activity Tolerance: Patient tolerated treatment well Patient left: in chair;with call bell/phone within reach;with chair alarm set;with nursing/sitter in room Nurse Communication: Mobility status PT Visit Diagnosis: Other abnormalities of gait and mobility (R26.89)     Time: 1351-1403 PT Time Calculation (min) (ACUTE ONLY): 12 min  Charges:    $Gait Training: 8-22 mins PT General Charges $$ ACUTE PT VISIT: 1 Visit                     Sueellen NOVAK, PT, DPT Acute Rehab Services [phone]    Dorion Petillo 10/23/2024, 3:02 PM

## 2024-10-23 NOTE — Discharge Summary (Signed)
 Physician Discharge Summary   Patient: Sara [name] MRN: [id number] DOB: 12-22-1945  Admit date:     10/17/2024  Discharge date: 10/23/2024  Discharge Physician: Sabas GORMAN Brod   PCP: Melonie Colonel, Mikel HERO, MD   Recommendations at discharge:   Follow-up with neurology as outpatient Follow-up PCP in 1 week Dose of Hyzaar changed to 1 tablet daily, also added amlodipine 5 mg daily Take insulin  70/30, 15 units subcu twice daily Patient to go home with home health PT and rolling walker  Discharge Diagnoses: Principal Problem:   CVA (cerebral vascular accident) (HCC) Active Problems:   Hypertension with renal disease   Hypokalemia   Type 2 diabetes mellitus with hyperglycemia (HCC)   Hypothyroidism   Mixed hyperlipidemia   PVD (peripheral vascular disease)   Debility   Obesity, Class II, BMI 35-39.9  Resolved Problems:   * No resolved hospital problems. *  Hospital Course:   78-yo female history of htn, insulin -dependent DM type II, CKD 2, peripheral vascular disease and hypothyroidism presenting to emergency department complaining of decreased sensation of the left lower leg with associated numbness.  Patient has been seeing vascular surgery few months ago.  Reported that symptoms started 4 days ago having difficulty walking.  At presentation to ED patient found borderline hypertensive otherwise hemodynamically stable.  MRI of the brain showing acute nonhemorrhagic infarction of the left pons. Chronic encephalomalacia changes in the left basal ganglia and external capsule. Extensive periventricular and deep cerebral white matter disease.  CT head no acute intracranial abnormality.Remote left basal ganglia infarct and chronic microvascular ischemic change.   CT angio bifemoral: Extensive chronic finding without any acute obstruction.  Bilateral lower extremities blood vessels are patent however there is high-grade stenosis.  Please review the angiogram for detail  information.    Assessment and Plan:  Acute pontine CVA Presented with right sided weakness which appears to have mostly resolved.  MRI noting acute nonhemorrhagic lacunar infarct in the left pons.   Neurology was consulted Recommended DAPT with aspirin  and Plavix  for 3 weeks then transition to Plavix  daily thereafter.  CT angio noting less than 20% stenosis bilaterally with no other frank occlusion or stenosis noted.   Echo noting no PFO.  Evaluated by PT/OT.   Patient had significantly improved.  No longer qualifies to go to CIR. Patient to go home with home health PT/OT, rolling walker   Uncontrolled insulin -dependent diabetes mellitus  A1c greater than 8 suggesting poor control.   Current home regimen includes 70/30 and metformin .  Currently adding insulin  glargine 10 units (not on 7030 while inpatient).   CBG well-controlled It seems patient was not taking insulin  70/30 at home.  Will discharge on insulin  70/30 15 units subcu twice daily, continue metformin  Follow-up with PCP in 1 week to adjust the dose of insulin    Hypertension  Resume losartan /hydrochlorothiazide , 1 tablet daily instead of 2 tablets daily which patient takes at home Will add amlodipine 5 mg daily   Hyperlipidemia  Atorvastatin  on board.   Hypokalemia Replete      Consultants: Neurology Procedures performed:  Disposition: Home Diet recommendation:  Regular diet DISCHARGE MEDICATION: Allergies as of 10/23/2024   No Known Allergies      Medication List     STOP taking these medications    HumaLOG  Mix 75/25 (75-25) 100 UNIT/ML Susp injection Generic drug: insulin  lispro protamine -lispro       TAKE these medications    amLODipine 5 MG tablet Commonly known as: NORVASC Take  1 tablet (5 mg total) by mouth daily.   aspirin  EC 81 MG tablet Take 1 tablet (81 mg total) by mouth daily for 16 days. Take aspirin  and Plavix  together for 16 more days and then stop aspirin  and continue with  Plavix  indefinitely What changed: additional instructions   atorvastatin  40 MG tablet Commonly known as: LIPITOR Take 40 mg by mouth daily.   clopidogrel  75 MG tablet Commonly known as: PLAVIX  Take 1 tablet (75 mg total) by mouth daily. Take aspirin  and Plavix  together for 16 days and then stop aspirin  and continue taking Plavix  indefinitely. Start taking on: October 24, 2024   Euthyrox  50 MCG tablet Generic drug: levothyroxine  Take 50 mcg by mouth daily before breakfast.   HumuLIN  70/30 (70-30) 100 UNIT/ML injection Generic drug: insulin  NPH-regular Human Inject 15 Units into the skin 2 (two) times daily with a meal. What changed:  how much to take how to take this when to take this additional instructions   losartan -hydrochlorothiazide  50-12.5 MG tablet Commonly known as: HYZAAR Take 1 tablet by mouth daily. What changed: how much to take   meclizine  12.5 MG tablet Commonly known as: ANTIVERT  Take 1 tablet (12.5 mg total) by mouth 3 (three) times daily as needed for dizziness.   metFORMIN  1000 MG tablet Commonly known as: GLUCOPHAGE  Take 1,000 mg by mouth 2 (two) times daily with a meal.               Durable Medical Equipment  (From admission, onward)           Start     Ordered   10/23/24 1123  For home use only DME Walker rolling  Once       Question Answer Comment  Walker: With 5 Inch Wheels   Patient needs a walker to treat with the following condition Stroke [facility])      10/23/24 1122            Contact information for follow-up providers     [facility]. Schedule an appointment as soon as possible for a visit in 1 month(s).   Specialty: Neurology Why: stroke clinic Contact information: [address] [place]  [address] [phone]        Melonie Colonel, Mikel HERO, MD Follow up in 1 week(s).   Specialty: Family Medicine Contact information: [address] [place] [address] [phone]              Contact information for after-discharge care     Home Medical Care     [facility]) .   Service: Home Health Services Contact information: [address] [place]   [address] [phone]                    Discharge Exam: Fredricka Weights   10/17/24 2142  Weight: 93.4 kg   General-appears in no acute distress Heart-S1-S2, regular, no murmur auscultated Lungs-clear to auscultation bilaterally, no wheezing or crackles auscultated Abdomen-soft, nontender, no organomegaly Extremities-no edema in the lower extremities Neuro-alert, oriented x3, no focal deficit noted  Condition at discharge: good  The results of significant diagnostics from this hospitalization (including imaging, microbiology, ancillary and laboratory) are listed below for reference.   Imaging Studies: CT ANGIO HEAD NECK W WO CM Result Date: 10/19/2024 EXAM: CTA HEAD AND NECK WITHOUT AND WITH 10/19/2024 08:40:56 AM TECHNIQUE: CTA of the head and neck  was performed without and with the administration of 75 mL of iohexol  (OMNIPAQUE ) 350 MG/ML injection. Multiplanar 2D and/or 3D reformatted images are provided for review. Automated exposure control, iterative reconstruction, and/or weight based adjustment of the mA/kV was utilized to reduce the radiation dose to as low as reasonably achievable. Stenosis of the internal carotid arteries measured using NASCET criteria. COMPARISON: CT of the head dated 10/18/2024. CLINICAL HISTORY: Stroke/TIA, determine embolic source. FINDINGS: CTA NECK: AORTIC ARCH AND ARCH VESSELS: Mild calcific plaque within the aortic arch. No dissection or arterial injury. No significant stenosis of the brachiocephalic or subclavian arteries. CERVICAL CAROTID ARTERIES: Mild calcific plaque within the carotid bulbs and origins of the internal carotid arteries, with less than 20%  luminal stenosis bilaterally. No dissection or arterial injury. CERVICAL VERTEBRAL ARTERIES: The vertebral arteries demonstrate mild calcific atheromatous disease, but no flow-limiting stenosis. No dissection or arterial injury. LUNGS AND MEDIASTINUM: Mild patchy opacification/consolidation present medially within the left upper lobe. SOFT TISSUES: No acute abnormality. BONES: No acute abnormality. CTA HEAD: ANTERIOR CIRCULATION: There is calcific plaque within the carotid siphons bilaterally and within the supraclinoid segments, with approximately 50% of luminal stenosis. The anterior and middle cerebral arteries appear to be normal in caliber. No evidence of aneurysm, large vessel occlusion or flow-limiting stenosis. POSTERIOR CIRCULATION: The posterior cerebral arteries and the cerebellar arteries appear to be normal in caliber. No evidence of aneurysm, large vessel occlusion or flow-limiting stenosis. No significant stenosis of the basilar artery. No significant stenosis of the vertebral arteries. OTHER: Chronic encephalomalacia changes again demonstrated within the left basal ganglia. There is moderate generalized cerebral volume loss and moderate diffuse cerebral white matter disease. No dural venous sinus thrombosis on this non-dedicated study. The patient is status post bilateral lens replacement. There is mild polypoid mucosal disease within the floors of the maxillary sinuses. IMPRESSION: 1. No large vessel occlusion, hemodynamically significant stenosis, or aneurysm in the head or neck. 2. Mild calcific plaque within the carotid bulbs and origins of the internal carotid arteries, with less than 20% luminal stenosis bilaterally. 3. Calcific plaque within the carotid siphons bilaterally and within the supraclinoid segments, with approximately 50% luminal stenosis. 4. Mild patchy opacification/consolidation medially within the left upper lobe, correlate clinically. Electronically signed by: Evalene Coho  MD 10/19/2024 12:36 PM EST RP Workstation: [id number]   ECHOCARDIOGRAM COMPLETE Result Date: 10/19/2024    ECHOCARDIOGRAM REPORT   Patient Name:   Sara [name] Date of Exam: 10/19/2024 Medical Rec #:  [id number]         Height:       62.0 in Accession #:    [id number]        Weight:       205.9 lb Date of Birth:  1945/11/14         BSA:          1.935 m Patient Age:    78 years          BP:           155/63 mmHg Patient Gender: F                 HR:           64 bpm. Exam Location:  Inpatient Procedure: 2D Echo, Cardiac Doppler and Color Doppler (Both Spectral and Color            Flow Doppler were utilized during procedure). Indications:    Stroke I63.9  History:  Patient has prior history of Echocardiogram examinations, most                 recent 06/14/2022. Stroke; Risk Factors:Diabetes, Hypertension and                 Dyslipidemia.  Sonographer:    Koleen Popper RDCS Referring Phys: [phone] RONDELL A SMITH IMPRESSIONS  1. Left ventricular ejection fraction, by estimation, is 55 to 60%. Left ventricular ejection fraction by 2D MOD biplane is 57.5 %. The left ventricle has normal function. The left ventricle has no regional wall motion abnormalities. There is moderate asymmetric left ventricular hypertrophy of the basal-septal segment. Left ventricular diastolic parameters are consistent with Grade I diastolic dysfunction (impaired relaxation).  2. Right ventricular systolic function is normal. The right ventricular size is normal.  3. The mitral valve is grossly normal. Trivial mitral valve regurgitation.  4. The aortic valve is tricuspid. Aortic valve regurgitation is trivial.  5. The inferior vena cava is normal in size with greater than 50% respiratory variability, suggesting right atrial pressure of 3 mmHg. Comparison(s): Changes from prior study are noted. 06/14/2022: LVEF 60-65%. FINDINGS  Left Ventricle: Left ventricular ejection fraction, by estimation, is 55 to 60%. Left ventricular ejection  fraction by 2D MOD biplane is 57.5 %. The left ventricle has normal function. The left ventricle has no regional wall motion abnormalities. The left ventricular internal cavity size was normal in size. There is moderate asymmetric left ventricular hypertrophy of the basal-septal segment. Left ventricular diastolic parameters are consistent with Grade I diastolic dysfunction (impaired relaxation). Indeterminate filling pressures. Right Ventricle: The right ventricular size is normal. No increase in right ventricular wall thickness. Right ventricular systolic function is normal. Left Atrium: Left atrial size was normal in size. Right Atrium: Right atrial size was normal in size. Pericardium: There is no evidence of pericardial effusion. Mitral Valve: The mitral valve is grossly normal. Trivial mitral valve regurgitation. Tricuspid Valve: The tricuspid valve is grossly normal. Tricuspid valve regurgitation is trivial. Aortic Valve: The aortic valve is tricuspid. Aortic valve regurgitation is trivial. Aortic regurgitation PHT measures 571 msec. Pulmonic Valve: The pulmonic valve was normal in structure. Pulmonic valve regurgitation is not visualized. Aorta: The aortic root and ascending aorta are structurally normal, with no evidence of dilitation. Venous: The inferior vena cava is normal in size with greater than 50% respiratory variability, suggesting right atrial pressure of 3 mmHg. IAS/Shunts: No atrial level shunt detected by color flow Doppler.  LEFT VENTRICLE PLAX 2D                        Biplane EF (MOD) LVIDd:         4.40 cm         LV Biplane EF:   Left LVIDs:         2.90 cm                          ventricular LV PW:         1.10 cm                          ejection LV IVS:        1.40 cm                          fraction by LVOT diam:  2.10 cm                          2D MOD LV SV:         63                               biplane is LV SV Index:   33                               57.5 %. LVOT Area:      3.46 cm                                Diastology                                LV e' medial:    4.51 cm/s LV Volumes (MOD)               LV E/e' medial:  14.3 LV vol d, MOD    94.0 ml       LV e' lateral:   8.39 cm/s A2C:                           LV E/e' lateral: 7.7 LV vol d, MOD    115.0 ml A4C: LV vol s, MOD    34.5 ml A2C: LV vol s, MOD    48.4 ml A4C: LV SV MOD A2C:   59.5 ml LV SV MOD A4C:   115.0 ml LV SV MOD BP:    61.7 ml RIGHT VENTRICLE             IVC RV Basal diam:  3.70 cm     IVC diam: 1.90 cm RV S prime:     12.00 cm/s TAPSE (M-mode): 2.0 cm LEFT ATRIUM           Index        RIGHT ATRIUM           Index LA diam:      2.80 cm 1.45 cm/m   RA Area:     13.30 cm LA Vol (A4C): 26.9 ml 13.90 ml/m  RA Volume:   30.00 ml  15.50 ml/m  AORTIC VALVE LVOT Vmax:   89.30 cm/s LVOT Vmean:  59.300 cm/s LVOT VTI:    0.183 m AI PHT:      571 msec  AORTA Ao Root diam: 3.30 cm Ao Asc diam:  3.60 cm MITRAL VALVE MV Area (PHT): 3.65 cm    SHUNTS MV Decel Time: 208 msec    Systemic VTI:  0.18 m MV E velocity: 64.30 cm/s  Systemic Diam: 2.10 cm MV A velocity: 87.70 cm/s MV E/A ratio:  0.73 Vinie Maxcy MD Electronically signed by Vinie Maxcy MD Signature Date/Time: 10/19/2024/11:43:52 AM    Final    DG CHEST PORT 1 VIEW Result Date: 10/18/2024 EXAM: 1 VIEW(S) XRAY OF THE CHEST 10/18/2024 09:09:00 AM COMPARISON: 06/20/2024 CLINICAL HISTORY: CVA (cerebral vascular accident) (HCC) FINDINGS: LUNGS AND PLEURA: Hypoinflated lungs. No focal pulmonary opacity. No pleural effusion. No pneumothorax. HEART AND MEDIASTINUM: No acute abnormality of the cardiac and mediastinal silhouettes. BONES AND SOFT TISSUES: No acute  osseous abnormality. IMPRESSION: 1. Hypoinflated lungs. Electronically signed by: Evalene Coho MD 10/18/2024 09:13 AM EST RP Workstation: [id number]   MR BRAIN WO CONTRAST Result Date: 10/18/2024 EXAM: MRI BRAIN WITHOUT CONTRAST 10/18/2024 06:12:47 AM TECHNIQUE: Multiplanar multisequence MRI of the  head/brain was performed without the administration of intravenous contrast. COMPARISON: CT of the head dated 10/18/2024. CLINICAL HISTORY: Neuro deficit, acute, stroke suspected. FINDINGS: BRAIN AND VENTRICLES: There is restricted diffusion present within the left pons on image 64 series 5, compatible with an acute nonhemorrhagic lacunar infarct. There are chronic encephalomalacia changes present within the left basal ganglia and external capsule. There is extensive periventricular and deep cerebral white matter disease. No intracranial hemorrhage. No mass. No midline shift. No hydrocephalus. The sella is unremarkable. Normal flow voids. ORBITS: No acute abnormality. SINUSES AND MASTOIDS: No acute abnormality. BONES AND SOFT TISSUES: Normal marrow signal. No acute soft tissue abnormality. IMPRESSION: 1. Acute nonhemorrhagic lacunar infarct in the left pons. 2. Chronic encephalomalacia changes in the left basal ganglia and external capsule. 3. Extensive periventricular and deep cerebral white matter disease. Electronically signed by: Evalene Coho MD 10/18/2024 06:20 AM EST RP Workstation: [id number]   CT Angio Aortobifemoral W and/or Wo Contrast Result Date: 10/18/2024 EXAM: CTA ABDOMEN AND PELVIS WITH AND WITHOUT CONTRAST AND RUNOFF CTA OF THE LOWER EXTREMITIES WITH CONTRAST 10/18/2024 03:20:52 AM TECHNIQUE: CTA images of the abdomen, pelvis and lower extremities with and without intravenous contrast. Three-dimensional MIP/volume rendered formations were performed. Automated exposure control, iterative reconstruction, and/or weight based adjustment of the mA/kV was utilized to reduce the radiation dose to as low as reasonably achievable. COMPARISON: None available. CLINICAL HISTORY: Claudication or leg ischemia. FINDINGS: VASCULATURE: AORTA: No acute finding. No abdominal aortic aneurysm. No dissection. 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 Distally, the celiac axis demonstrates classic anatomic configuration and is widely patent. SUPERIOR MESENTERIC ARTERY: Dictating a filling defect within the superior mesenteric artery at its origin is likely artifactual and related to streak artifact. No hemodynamically significant stenosis. No aneurysm or dissection. INFERIOR MESENTERIC ARTERY: 50% stenosis of the inferior mesenteric artery at its origin secondary to calcified atherosclerotic plaque. Distally, the vessel is widely patent. RENAL ARTERIES: 50-75% stenosis of the proximal left renal artery secondary to calcified atherosclerotic plaque. Single renal arteries demonstrate normal vascular morphology. No aneurysm or dissection. RIGHT ILIAC ARTERIES: Extensive calcified atherosclerotic plaque within the common iliac artery. No hemodynamically significant stenosis. The internal iliac artery is patent at its origin. RIGHT FEMORAL ARTERIES: Surgical changes of right common femoral endarterectomy. 50% stenosis of the superficial femoral artery at its origin. Extensive calcified atherosclerotic plaque is seen throughout the superficial femoral artery with serial hemodynamically significant stenosis throughout its length of up to 50%. Profunda femoral artery is widely patent. RIGHT POPLITEAL ARTERY: Extensive atherosclerotic plaque resulting in a 50-75% stenosis of the P1 segment of the popliteal artery and greater than 75% stenosis of the P2 segment and P3 segments of the popliteal artery. RIGHT CALF ARTERIES: Extensive arteriosclerosis of the calf vasculature with 3-vessel runoff to the right ankle and patency of the dorsalis pedis and plantar arch. LEFT ILIAC ARTERIES: Extensive calcified atherosclerotic disease plaque without hemodynamically significant stenosis. Internal iliac artery is patent. LEFT FEMORAL ARTERIES: Wide patency of the common femoral artery. Extensive scattered calcified atherosclerotic plaque is seen throughout the  superficial femoral artery, resulting in multifocal stenoses at 50%. Profunda femoral artery is widely patent. LEFT POPLITEAL ARTERY:  Scattered calcified atherosclerotic plaque resulting in a focal 50% stenosis of the P1 segment of the popliteal artery and multiple 50-75% stenosis of the P2 and P3 segments of the popliteal artery. LEFT CALF ARTERIES: Extensive arteriosclerosis of the calf vasculature. 3-vessel runoff to the ankle with patency of the dorsalis pedis and plantar arch. Right lower extremity arterial inflow: Extensive calcified atherosclerotic plaque within the common iliac artery. No hemodynamically significant stenosis. The internal iliac artery is patent at its origin. Right lower extremity arterial outflow: Surgical changes of right common femoral endarterectomy. 50% stenosis of the superficial femoral artery at its origin. Extensive calcified atherosclerotic plaque is seen throughout the superficial femoral artery with serial hemodynamically significant stenosis throughout its length of up to 50%. Profunda femoral artery is widely patent. Right lower extremity runoff: Extensive atherosclerotic plaque resulting in a 50-75% stenosis of the P1 segment of the popliteal artery and greater than 75% stenosis of the P2 segment and P3 segments of the popliteal artery. Extensive arteriosclerosis of the calf vasculature with 3-vessel runoff to the right ankle and patency of the dorsalis pedis and plantar arch. Left lower extremity arterial inflow: Extensive calcified atherosclerotic disease plaque without hemodynamically significant stenosis. Internal iliac artery is patent. Left lower extremity arterial outflow: Wide patency of the common femoral artery. Extensive scattered calcified atherosclerotic plaque is seen throughout the superficial femoral artery, resulting in multifocal stenoses at 50%. Profunda femoral artery is widely patent. Left lower extremity arterial runoff: Scattered calcified  atherosclerotic plaque resulting in a focal 50% stenosis of the P1 segment of the popliteal artery and multiple 50-75% stenosis of the P2 and P3 segments of the popliteal artery. Extensive arteriosclerosis of the calf vasculature. 3-vessel runoff to the ankle with patency of the dorsalis pedis and plantar arch. ABDOMEN AND PELVIS: LOWER CHEST: Visualized portion of the lower chest demonstrates no acute abnormality. LIVER: The liver is unremarkable. GALLBLADDER AND BILE DUCTS: Gallbladder is unremarkable. No biliary ductal dilatation. SPLEEN: The spleen is unremarkable. PANCREAS: The pancreas is unremarkable. ADRENAL GLANDS: 8 mm benign right adrenal adenoma. No follow-up imaging is recommended. KIDNEYS, URETERS AND BLADDER: No stones in the kidneys or ureters. No hydronephrosis. No evidence of perinephric or periureteral stranding. Urinary bladder is unremarkable. GI AND Bowel: Severe sigmoid diverticulosis. No superimposed focal inflammatory change. The stomach, small bowel, and large bowel are otherwise unremarkable. The appendix is absent. REPRODUCTIVE: Reproductive organs are unremarkable. PERITONEUM AND RETROPERITONEUM: No ascites or free air. LYMPH NODES: No evidence of lymphadenopathy. BONES AND SOFT TISSUES: 2.2 x 2.7 cm high attenuation cystic collection within the right groin may represent a hematoma or abscess, postoperative seroma, or chronic hematoma. Infiltrative postoperative changes noted within the right groin superficial to the common femoral artery. Small bilateral knee effusions. Bilateral pedal edema. Asymmetric fatty atrophy of the right gastrocnemius musculature. Osseous structures are age-appropriate. No acute bone abnormality. No lytic or blastic bone lesion. Extensive coronary artery calcifications. IMPRESSION: 1. 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 2. 50-75% stenosis of the proximal left renal artery due  to calcified atherosclerotic plaque; no aneurysm or dissection. 3. 50% stenosis of the inferior mesenteric artery at its origin due to calcified atherosclerotic plaque; distal vessel widely patent. Given the significant stenosis within the celiac axis , clinical correlation for signs and symptoms of chronic mesenteric ischemia is recommended. 4. Right lower extremity arterial inflow: Extensive calcified atherosclerotic plaque within the common iliac artery without hemodynamically  significant stenosis; internal iliac artery patent. 5. Right lower extremity arterial outflow: Surgical changes of right common femoral endarterectomy with 50% stenosis at the superficial femoral artery origin and serial stenoses up to 50% throughout its length; profunda femoral artery widely patent. 6. Right lower extremity arterial runoff: 50-75% stenosis of the P1 popliteal segment and greater than 75% stenosis of the P2 and P3 popliteal segments; three-vessel runoff to the ankle with patent dorsalis pedis and plantar arch. 7. Left lower extremity arterial inflow: Extensive calcified atherosclerotic plaque without hemodynamically significant stenosis; internal iliac artery patent. 8. Left lower extremity arterial outflow: Multifocal 50% stenoses in the superficial femoral artery; profunda femoral artery widely patent. 9. Left lower extremity arterial runoff: Focal 50% stenosis of the P1 popliteal segment and multiple 50-75% stenoses of the P2 and V3 popliteal segments; three-vessel runoff to the ankle with patent dorsalis pedis and plantar arch. 10. Severe sigmoid diverticulosis without superimposed inflammatory change. 11. Postoperative right groin fluid collection measuring 2.2 x 2.7 cm, differential includes hematoma, abscess, or seroma; correlate clinically and consider targeted ultrasound if indicated. Electronically signed by: Dorethia Molt MD 10/18/2024 04:04 AM EST RP Workstation: [id number]   CT HEAD WO CONTRAST ( ) Result  Date: 10/18/2024 EXAM: CT HEAD WITHOUT CONTRAST 10/18/2024 03:24:00 AM TECHNIQUE: CT of the head was performed without the administration of intravenous contrast. Automated exposure control, iterative reconstruction, and/or weight based adjustment of the mA/kV was utilized to reduce the radiation dose to as low as reasonably achievable. COMPARISON: None available. CLINICAL HISTORY: Neuro deficit, acute, stroke suspected FINDINGS: BRAIN AND VENTRICLES: No acute hemorrhage. No evidence of acute infarct. Remote left basal ganglia infarct. Patchy white matter hypodensities compatible with chronic microvascular ischemic changes. No hydrocephalus. No extra-axial collection. No mass effect or midline shift. ORBITS: No acute abnormality. SINUSES: No acute abnormality. SOFT TISSUES AND SKULL: No acute soft tissue abnormality. No skull fracture. IMPRESSION: 1. No acute intracranial abnormality. 2. Remote left basal ganglia infarct and chronic microvascular ischemic change. Electronically signed by: Gilmore Molt MD 10/18/2024 03:31 AM EST RP Workstation: [id number]    Microbiology: Results for orders placed or performed during the hospital encounter of 06/16/24  Culture, blood (Routine X 2) w Reflex to ID Panel     Status: None   Collection Time: 06/18/24  1:38 PM   Specimen: BLOOD  Result Value Ref Range Status   Specimen Description BLOOD RIGHT ANTECUBITAL  Final   Special Requests   Final    BOTTLES DRAWN AEROBIC AND ANAEROBIC Blood Culture results may not be optimal due to an inadequate volume of blood received in culture bottles   Culture   Final    NO GROWTH 5 DAYS Performed at [facility], [address], [place], [place] [address]    Report Status 06/23/2024 FINAL  Final  Culture, blood (Routine X 2) w Reflex to ID Panel     Status: None   Collection Time: 06/18/24  1:42 PM   Specimen: BLOOD  Result Value Ref Range Status   Specimen Description BLOOD RIGHT ANTECUBITAL  Final   Special  Requests   Final    BOTTLES DRAWN AEROBIC AND ANAEROBIC Blood Culture adequate volume   Culture   Final    NO GROWTH 5 DAYS Performed at [facility], [address] [place], [place] [address]    Report Status 06/23/2024 FINAL  Final    Labs: CBC: Recent Labs  Lab 10/17/24 2210 10/19/24 0351  WBC 8.4 8.1  HGB  13.1 12.8  HCT 41.2 39.0  MCV 86.2 84.4  PLT 259 261   Basic Metabolic Panel: Recent Labs  Lab 10/17/24 2210 10/19/24 0351 10/23/24 0228  NA 137 142 136  K 3.4* 3.1* 4.4  CL 95* 105 102  CO2 30 30 27   GLUCOSE 180* 39* 166*  BUN 20 15 21   CREATININE 0.86 0.88 1.04*  CALCIUM  9.0 8.7* 8.9   Liver Function Tests: Recent Labs  Lab 10/17/24 2210  AST 20  ALT 17  ALKPHOS 89  BILITOT 0.9  PROT 7.8  ALBUMIN  3.6   CBG: Recent Labs  Lab 10/22/24 0612 10/22/24 1115 10/22/24 1701 10/22/24 2102 10/23/24 0637  GLUCAP 132* 200* 207* 244* 156*    Discharge time spent: greater than 30 minutes.  Signed: Sabas GORMAN Brod, MD [facility] 10/23/2024

## 2024-10-23 NOTE — TOC Transition Note (Signed)
 Transition of Care [facility]) - Discharge Note   Patient Details  Name: Sara [name] MRN: [id number] Date of Birth: 03-13-1946  Transition of Care [facility] CM/SW Contact:  Andrez JULIANNA George, RN Phone Number: 10/23/2024, 11:53 AM   Clinical Narrative:     Pt is doing better and is able to dc home with home health services. [facility] has accepted for [facility] services. Information on the AVS. [facility] will contact her for the first home visit.  Walker ordered through [employer] and will be delivered to the patients room.  Pt has transportation home.  Final next level of care: Home w Home Health Services Barriers to Discharge: No Barriers Identified   Patient Goals and CMS Choice   CMS Medicare.gov Compare Post Acute Care list provided to:: Patient Choice offered to / list presented to : Patient      Discharge Placement                       Discharge Plan and Services Additional resources added to the After Visit Summary for     Discharge Planning Services: CM Consult            DME Arranged: Vannie rolling DME Agency: [employer] Date DME Agency Contacted: 10/23/24   Representative spoke with at DME Agency: [place] HH Arranged: PT, OT HH Agency: [facility] Date HH Agency Contacted: 10/23/24   Representative spoke with at [facility] Agency: [place]  Social Drivers of Health (SDOH) Interventions SDOH Screenings   Food Insecurity: Food Insecurity Present (10/18/2024)  Housing: High Risk (10/18/2024)  Transportation Needs: Unmet Transportation Needs (10/18/2024)  Utilities: Not At Risk (10/18/2024)  Social Connections: Moderately Isolated (10/18/2024)  Tobacco Use: Medium Risk (10/17/2024)     Readmission Risk Interventions     No data to display

## 2024-10-23 NOTE — H&P (Shared)
 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  Per chart review patient lives with spouse.  1 level home one-step to entry.  Modified independent furniture surfer prior to admission.  Independent with most ADLs.  She is the primary caretaker for her husband who is blind.  She has a daughter with good support.  Presented 10/17/2024 with acute onset right leg numbness and balance problems with gait abnormality.  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  50 to 75% stenosis of the proximal left renal artery due to calcified atherosclerotic plaque no aneurysm or dissection.  Cranial CT scan negative.  Remote left basal ganglia infarction and chronic microvascular ischemic change.  Patient did not receive TNK.  Admission chemistries unremarkable except potassium 3.4 chloride 95 glucose 180, hemoglobin A1c 8.4.  MRI showed acute nonhemorrhagic lacunar infarct in the left pons.  Chronic encephalomalacia changes in the left basal ganglia and external capsule.  CTA of head and neck no large vessel occlusion or hemodynamically significant stenosis or aneurysm.  Echocardiogram with ejection fraction of 55 to 60% no wall motion abnormalities grade 1 diastolic dysfunction.  Neurology follow-up maintained on aspirin  81 mg daily and  Plavix  75 mg daily for 3 weeks then Plavix  alone.  Lovenox  added for DVT prophylaxis.  Hospital course mild AKI/CKD with latest creatinine 1.04 and monitored.  Tolerating a regular consistency diet.  Therapy evaluations completed due to patient's decreased functional mobility and right side weakness was admitted for a comprehensive rehab program.  Review of Systems  Constitutional:  Positive for malaise/fatigue.  HENT:  Negative for hearing loss.   Eyes:  Positive for blurred vision. Negative for double vision.  Respiratory:  Negative for cough and wheezing.        Shortness of breath on heavy exertion  Cardiovascular:  Positive for leg swelling. Negative for palpitations.  Gastrointestinal:  Positive for constipation. Negative for heartburn, nausea and vomiting.  Genitourinary:  Negative for dysuria, flank pain and hematuria.  Musculoskeletal:  Positive for joint pain and myalgias.  Skin:  Negative for rash.  Neurological:  Positive for sensory change and weakness.  All other systems reviewed and are negative.  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/2022   DM (diabetes mellitus) (HCC)    DOE (dyspnea on exertion) 05/24/2022   Essential hypertension 04/25/2022   Hyperglycemia due to type 2 diabetes mellitus (HCC) 05/24/2022   Hypertension with renal disease    Hypothyroidism    Long term (current) use of insulin  (HCC)  Menopause 05/24/2022   Mixed hyperlipidemia    Morbid obesity due to excess calories (HCC)    Obesity (BMI 35.0-39.9 without comorbidity) 05/24/2022   Postmenopausal bleeding 05/30/2022   PVD  (peripheral vascular disease)    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) 05/24/2022   Stroke (HCC) 05/24/2022   Transient arterial retinal occlusion 05/24/2022   Type 2 diabetes mellitus with hyperglycemia (HCC)    Type 2 diabetes mellitus with other specified complication (HCC) 05/24/2022   Type 2 diabetes mellitus with peripheral angiopathy (HCC) 05/24/2022   Past Surgical History:  Procedure Laterality Date   APPENDECTOMY     as a teenager   CATARACT EXTRACTION W/ INTRAOCULAR LENS IMPLANT Bilateral    CESAREAN SECTION  1976   CESAREAN SECTION  1979   COLONOSCOPY     ENDARTERECTOMY FEMORAL Right 06/16/2024   Procedure: RIGHT FEMORAL ENDARTERECTOMY;  Surgeon: Pearline Norman RAMAN, MD;  Location: [facility] OR;  Service: Vascular;  Laterality: Right;   PATCH ANGIOPLASTY Right 06/16/2024   Procedure: ANGIOPLASTY, USING GEORGE BIOLOGIC PATCH;  Surgeon: Pearline Norman RAMAN, MD;  Location: [facility] OR;  Service: Vascular;  Laterality: Right;   polypectomy  2023   Uterine   TUBAL LIGATION  1979   with C-Section   Family History  Problem Relation Age of Onset   Heart disease Mother    Diabetes Brother    Diabetes Maternal Uncle    Colon cancer Neg Hx    Esophageal cancer Neg Hx    Stomach cancer Neg Hx    Rectal cancer Neg Hx    Social History:  reports that she quit smoking about 20 years ago. Her smoking use included cigarettes. She has never used smokeless tobacco. She reports current alcohol use. She reports that she does not use drugs. Allergies: Allergies[1] Medications Prior to Admission  Medication Sig Dispense Refill   aspirin  EC 81 MG tablet Take 81 mg by mouth daily. Swallow whole.     atorvastatin  (LIPITOR) 40 MG tablet Take 40 mg by mouth daily.     EUTHYROX  50 MCG tablet Take 50 mcg by mouth daily before breakfast.     losartan -hydrochlorothiazide  (HYZAAR) 50-12.5 MG tablet Take 2 tablets by mouth daily.      metFORMIN  (GLUCOPHAGE ) 1000 MG tablet Take 1,000 mg by mouth 2 (two) times daily with a meal.     insulin  lispro protamine -lispro (HUMALOG  MIX 75/25) (75-25) 100 UNIT/ML SUSP injection Inject 0.15 mLs (15 Units total) into the skin daily with breakfast. (Patient not taking: Reported on 10/19/2024) 10 mL 11   insulin  NPH-regular Human (HUMULIN  70/30) (70-30) 100 UNIT/ML injection Inject 30 units into the skin 2(two) times daily with a meal. 30 units in the morning and 50 units at night (Patient not taking: Reported on 10/19/2024) 10 mL 11   meclizine  (ANTIVERT ) 12.5 MG tablet Take 1 tablet (12.5 mg total) by mouth 3 (three) times daily as needed for dizziness. (Patient not taking: Reported on 10/19/2024) 30 tablet 0      Home: Home Living Family/patient expects to be discharged to:: Private residence Living Arrangements: Spouse/significant other Available Help at Discharge: Family, Available 24 hours/day Type of Home: House Home Access: Stairs to enter [employer] of Steps: 1 Entrance Stairs-Rails: None Home Layout: One level Bathroom Shower/Tub: Engineer, Manufacturing Systems: Standard Bathroom Accessibility: Yes Home Equipment: BSC/3in1, Shower seat, Rollator (4 wheels) Additional Comments: Husband is blind pt reports is his primary caretaker,  assists with all ADLs   Functional History: Prior Function Prior Level of Function : Independent/Modified Independent, Driving Mobility Comments: Pt reports Mod I rollator since last week. Prior to that she was Mod I furniture surfing ADLs Comments: Ind with most ADLs  Functional Status:  Mobility: Bed Mobility Overal bed mobility: Needs Assistance Bed Mobility: Supine to Sit Supine to sit: Min assist General bed mobility comments: assistance to raise trunk Transfers Overall transfer level: Needs assistance Equipment used: Rolling walker (2 wheels) Transfers: Sit to/from Stand Sit to Stand: Contact guard assist General  transfer comment: cues for hand placement and CGA for balance Ambulation/Gait Ambulation/Gait assistance: Contact guard assist, Min assist Gait Distance (Feet): 110 Feet Assistive device: Rolling walker (2 wheels) Gait Pattern/deviations: Trunk flexed, Decreased stride length, Step-through pattern, Drifts right/left General Gait Details: Cues for proximity to RW as pt was noted to stand towards the R side of the RW. Pt noted to drift to the R today when ambulating and required up to Min A to navigate RW around obstacles and when turning. Pt also required several standing rest breaks due to knee pain and fatigue. Gait velocity: decreased Stairs: Yes Stairs assistance: Min assist Stair Management: Two rails, Step to pattern, Forwards Number of Stairs: 1 General stair comments: Cues for sequencing. Pt very slow to turn around on steps.    ADL: ADL Overall ADL's : Needs assistance/impaired Eating/Feeding: Set up, Sitting Grooming: Wash/dry hands, Wash/dry face, Oral care, Contact guard assist, Standing Grooming Details (indicate cue type and reason): at sink Upper Body Bathing: Contact guard assist, Standing Lower Body Bathing: Minimal assistance, Sit to/from stand Upper Body Dressing : Set up, Sitting Upper Body Dressing Details (indicate cue type and reason): gown for back Lower Body Dressing: Moderate assistance, Sit to/from stand Lower Body Dressing Details (indicate cue type and reason): socks Toilet Transfer: Contact guard assist, Ambulation, Regular Toilet, Rolling walker (2 wheels) Toilet Transfer Details (indicate cue type and reason): cues for safety Toileting- Clothing Manipulation and Hygiene: Set up, Sitting/lateral lean Functional mobility during ADLs: Minimal assistance, Rolling walker (2 wheels)  Cognition: Cognition Orientation Level: Oriented X4 Cognition Arousal: Alert Behavior During Therapy: WFL for tasks assessed/performed  Physical Exam: Blood pressure (!)  173/63, pulse 61, temperature 98.1 F (36.7 C), temperature source Oral, resp. rate 18, height 5' 2 (1.575 m), weight 93.4 kg, SpO2 97%. Physical Exam Neurological:     Comments: Patient is alert and makes eye contact with examiner.  Follows simple verbal commands.  Provides name and age.     Results for orders placed or performed during the hospital encounter of 10/17/24 (from the past 48 hours)  Glucose, capillary     Status: Abnormal   Collection Time: 10/21/24 11:20 AM  Result Value Ref Range   Glucose-Capillary 211 (H) 70 - 99 mg/dL    Comment: Glucose reference range applies only to samples taken after fasting for at least 8 hours.  Glucose, capillary     Status: Abnormal   Collection Time: 10/21/24  4:33 PM  Result Value Ref Range   Glucose-Capillary 210 (H) 70 - 99 mg/dL    Comment: Glucose reference range applies only to samples taken after fasting for at least 8 hours.  Glucose, capillary     Status: Abnormal   Collection Time: 10/21/24  9:13 PM  Result Value Ref Range   Glucose-Capillary 208 (H) 70 - 99 mg/dL    Comment: Glucose reference range applies only to samples taken after fasting for at  least 8 hours.   Comment 1 Notify RN    Comment 2 Document in Chart   Glucose, capillary     Status: Abnormal   Collection Time: 10/22/24  6:12 AM  Result Value Ref Range   Glucose-Capillary 132 (H) 70 - 99 mg/dL    Comment: Glucose reference range applies only to samples taken after fasting for at least 8 hours.   Comment 1 Notify RN    Comment 2 Document in Chart   Glucose, capillary     Status: Abnormal   Collection Time: 10/22/24 11:15 AM  Result Value Ref Range   Glucose-Capillary 200 (H) 70 - 99 mg/dL    Comment: Glucose reference range applies only to samples taken after fasting for at least 8 hours.  Glucose, capillary     Status: Abnormal   Collection Time: 10/22/24  5:01 PM  Result Value Ref Range   Glucose-Capillary 207 (H) 70 - 99 mg/dL    Comment: Glucose  reference range applies only to samples taken after fasting for at least 8 hours.  Glucose, capillary     Status: Abnormal   Collection Time: 10/22/24  9:02 PM  Result Value Ref Range   Glucose-Capillary 244 (H) 70 - 99 mg/dL    Comment: Glucose reference range applies only to samples taken after fasting for at least 8 hours.   Comment 1 Notify RN    Comment 2 Document in Chart   Basic metabolic panel     Status: Abnormal   Collection Time: 10/23/24  2:28 AM  Result Value Ref Range   Sodium 136 135 - 145 mmol/L   Potassium 4.4 3.5 - 5.1 mmol/L   Chloride 102 98 - 111 mmol/L   CO2 27 22 - 32 mmol/L   Glucose, Bld 166 (H) 70 - 99 mg/dL    Comment: Glucose reference range applies only to samples taken after fasting for at least 8 hours.   BUN 21 8 - 23 mg/dL   Creatinine, Ser 8.95 (H) 0.44 - 1.00 mg/dL   Calcium  8.9 8.9 - 10.3 mg/dL   GFR, Estimated 56 (L) >60 mL/min    Comment: (NOTE) Calculated using the CKD-EPI Creatinine Equation (2021)    Anion gap 7 5 - 15    Comment: Performed at [facility], [address] [place], [place] [address]  Glucose, capillary     Status: Abnormal   Collection Time: 10/23/24  6:37 AM  Result Value Ref Range   Glucose-Capillary 156 (H) 70 - 99 mg/dL    Comment: Glucose reference range applies only to samples taken after fasting for at least 8 hours.   Comment 1 Notify RN    Comment 2 Document in Chart    No results found.    Blood pressure (!) 173/63, pulse 61, temperature 98.1 F (36.7 C), temperature source Oral, resp. rate 18, height 5' 2 (1.575 m), weight 93.4 kg, SpO2 97%.  Medical Problem List and Plan: 1. 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  Antithrombotics: -DVT/anticoagulation:  Pharmaceutical: Lovenox   -antiplatelet therapy: Aspirin  81 mg daily and Plavix  75 mg daily x 3 weeks then Plavix  alone 3. Pain Management: Oxycodone   5 mg every 6 hours as needed 4. Mood/Behavior/Sleep: Provide emotional support  -antipsychotic agents: N/A 5. Neuropsych/cognition: This patient is capable of making decisions on her own behalf. 6. Skin/Wound Care: Routine skin checks 7. Fluids/Electrolytes/Nutrition: Routine N  and outs with follow-up chemistries 8.  Hypertension.  Cozaar  50 mg daily, HCTZ 12.5 mg daily.  Monitor with increased mobility 9.  Diabetes mellitus.  Hemoglobin A1c 8.4.  Currently on Lantus  insulin  10 units daily.  Patient on Glucophage  1000 mg twice daily prior to admission.  Reportedly she was not taking her Humalog  mix 75/25 or Humulin  70/30 11.  Hypothyroidism.  Synthroid  12.  Class II obesity.  BMI 37.66.  Follow-up dietary 13.  CKD stage II.  Follow-up chemistries 14.  Hyperlipidemia.  Lipitor 15.  History of peripheral vascular disease.  Status post right femoral enterectomy 8/25.  Follow-up outpatient 16.  Chronic anemia.  Follow-up CBC 17.  Proximal 4th and 5th digit proximal phalangeal fractures with intra-articular extension.  Patient does have a walking boot.  Weightbearing as tolerated. Toribio PARAS Cordia Miklos, PA-C 10/23/2024     [1] No Known Allergies

## 2024-10-23 NOTE — Progress Notes (Signed)
° °  Inpatient Rehabilitation Admissions Coordinator   Assessed patient in the room as she ambulated to bathroom with her RW and as she performed her own peri care and toileting. Patient has progressed to no longer need CIR level rehab at this time. She is in agreement. New for her is the use of the RW. I will update acute team and TOC. We will sign off. Recommend home with [facility] and she is in agreement.  Heron Leavell, RN, MSN Rehab Admissions Coordinator [phone] 10/23/2024 11:10 AM

## 2024-11-06 ENCOUNTER — Other Ambulatory Visit (HOSPITAL_COMMUNITY)

## 2024-11-30 ENCOUNTER — Ambulatory Visit (INDEPENDENT_AMBULATORY_CARE_PROVIDER_SITE_OTHER): Admitting: Podiatry

## 2024-11-30 ENCOUNTER — Ambulatory Visit

## 2024-11-30 DIAGNOSIS — S92502A Displaced unspecified fracture of left lesser toe(s), initial encounter for closed fracture: Secondary | ICD-10-CM | POA: Diagnosis not present

## 2024-11-30 DIAGNOSIS — S92502D Displaced unspecified fracture of left lesser toe(s), subsequent encounter for fracture with routine healing: Secondary | ICD-10-CM

## 2024-11-30 NOTE — Progress Notes (Unsigned)
 Recheck on fracture of left 4th and 5th proximal phalanx base.  There is radiographic evidence of healing but not completely healed at this time.  She has no pain on exam and with range of motion.  She was given a Velcro toe splint to try but she can remove it if it is causing more pain than comfort.  Will have her try regular shoes as tolerated but if her pain starts to come back she is to call for follow-up and go back to the surgical shoe

## 2024-12-01 ENCOUNTER — Encounter: Payer: Self-pay | Admitting: Neurology

## 2024-12-01 ENCOUNTER — Ambulatory Visit: Admitting: Neurology

## 2024-12-01 VITALS — BP 145/71 | HR 73 | Ht 62.0 in | Wt 190.5 lb

## 2024-12-01 DIAGNOSIS — I6300 Cerebral infarction due to thrombosis of unspecified precerebral artery: Secondary | ICD-10-CM | POA: Diagnosis not present

## 2024-12-01 DIAGNOSIS — E782 Mixed hyperlipidemia: Secondary | ICD-10-CM | POA: Diagnosis not present

## 2024-12-01 NOTE — Patient Instructions (Signed)
 Continue Plavix  75 mg daily for secondary stroke prevention Strict management of vascular risk factors with a goal BP less than 130/90, A1c less than 7.0, LDL less than 70 for secondary stroke prevention Continue physical therapy Continue follow up with primary care Return here as needed

## 2024-12-01 NOTE — Progress Notes (Signed)
 "  Patient: Sara [name] Date of Birth: 08-22-1946  Reason for Visit: Stroke clinic follow-up History from: Patient, daughter Primary Neurologist: Rosemarie   ASSESSMENT AND PLAN 79 y.o. 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  Presented with right leg weakness and numbness.  Vascular risk factors: HTN, HLD, type 2 diabetes, PVD (right femoral endarterectomy August 2025).  Continues with mild right arm and leg weakness.  - Doing overall well, continues with mild weakness to the right arm and leg - Continue Plavix  75 mg daily for secondary stroke prevention - Strict management of vascular risk factors with a goal BP less than 130/90, A1c less than 7.0, LDL less than 70 for secondary stroke prevention - Continue home health physical therapy - Continue close follow-up with primary care - Recommend exercise, healthy eating - Return here as needed  HISTORY OF PRESENT ILLNESS: Today 12/01/24 Here with her daughter. Speaks some english, daughter helps translate. Remains on Plavix , tolerating well. She lives alone with her husband who is legally blind. Their granddaughter comes in AM and gives medications, family bring food. Insulin  was increased. BP 145/71, on Norvasc  and Losartan -hydrochlorothiazide . Doing home health PT, right leg is still a little weak. No cane or walker. No falls. She has vision issues at baseline, due to diabetes. Memory issues at baseline with family history of AD.  HISTORY  Presented 10/17/2024 with right leg weakness and numbness.  MRI of the brain showed acute ischemic stroke within the left pons.  Etiology likely small vessel disease. CT head No acute intracranial abnormality. Remote left basal ganglia infarct and chronic microvascular ischemic change. CTA head & neck carotid bulbs with less than 20% stenosis bilaterally. Calcific plaque within the carotid siphons bilaterally and within the supraclinoid segments, with approximately  50% luminal stenosis. MRI  Acute nonhemorrhagic lacunar infarct in the left pons. Chronic encephalomalacia changes in the left basal ganglia and external capsule. Extensive periventricular and deep cerebral white matter disease. 2D Echo EF 55-60% LDL 44 HgbA1c 8.4 Aspirin  81 mg daily prior to admission, aspirin  81 and Plavix  75 daily for 3 weeks, then Plavix  alone  REVIEW OF SYSTEMS: Out of a complete 14 system review of symptoms, the patient complains only of the following symptoms, and all other reviewed systems are negative.  See HPI  ALLERGIES: Allergies[1]  HOME MEDICATIONS: Outpatient Medications Prior to Visit  Medication Sig Dispense Refill   amLODipine  (NORVASC ) 5 MG tablet Take 1 tablet (5 mg total) by mouth daily. 30 tablet 11   atorvastatin  (LIPITOR) 40 MG tablet Take 40 mg by mouth daily.     clopidogrel  (PLAVIX ) 75 MG tablet Take 1 tablet (75 mg total) by mouth daily. Take aspirin  and Plavix  together for 16 days and then stop aspirin  and continue taking Plavix  indefinitely. 30 tablet 3   EUTHYROX  50 MCG tablet Take 50 mcg by mouth daily before breakfast.     insulin  NPH-regular Human (HUMULIN  70/30) (70-30) 100 UNIT/ML injection Inject 15 Units into the skin 2 (two) times daily with a meal. 10 mL 1   losartan -hydrochlorothiazide  (HYZAAR) 50-12.5 MG tablet Take 1 tablet by mouth daily. 30 tablet 3   meclizine  (ANTIVERT ) 12.5 MG tablet Take 1 tablet (12.5 mg total) by mouth 3 (three) times daily as needed for dizziness. 30 tablet 0   metFORMIN  (GLUCOPHAGE ) 1000 MG tablet Take 1,000 mg by mouth 2 (two) times daily with a meal.     No facility-administered medications prior to visit.  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   DM (diabetes mellitus) (HCC)    DOE (dyspnea on exertion) 05/24/2022   Essential hypertension 04/25/2022   Hyperglycemia due to type 2 diabetes mellitus (HCC) 05/24/2022   Hypertension with renal disease    Hypothyroidism    Long term (current) use of insulin  (HCC)    Menopause 05/24/2022   Mixed hyperlipidemia    Morbid obesity due to excess calories (HCC)    Obesity (BMI 35.0-39.9 without comorbidity) 05/24/2022   Postmenopausal bleeding 05/30/2022   PVD (peripheral vascular disease)    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) 05/24/2022   Stroke (HCC) 05/24/2022   Transient arterial retinal occlusion 05/24/2022   Type 2 diabetes mellitus with hyperglycemia (HCC)    Type 2 diabetes mellitus with other specified complication (HCC) 05/24/2022   Type 2 diabetes mellitus with peripheral angiopathy (HCC) 05/24/2022    PAST SURGICAL HISTORY: Past Surgical History:  Procedure Laterality Date   APPENDECTOMY     as a teenager   CATARACT EXTRACTION W/ INTRAOCULAR LENS IMPLANT Bilateral    CESAREAN SECTION  1976   CESAREAN SECTION  1979   COLONOSCOPY     ENDARTERECTOMY FEMORAL Right 06/16/2024   Procedure: RIGHT FEMORAL ENDARTERECTOMY;  Surgeon: Pearline Norman RAMAN, MD;  Location: [facility] OR;  Service: Vascular;  Laterality: Right;   PATCH ANGIOPLASTY Right 06/16/2024   Procedure: ANGIOPLASTY, USING GEORGE BIOLOGIC PATCH;  Surgeon: Pearline Norman RAMAN, MD;  Location: [facility] OR;  Service: Vascular;  Laterality: Right;   polypectomy  2023   Uterine   TUBAL LIGATION  1979   with C-Section    FAMILY HISTORY: Family History  Problem Relation Age of Onset   Heart disease Mother    Diabetes Brother    Diabetes Maternal Uncle    Colon cancer Neg Hx    Esophageal  cancer Neg Hx    Stomach cancer Neg Hx    Rectal cancer Neg Hx     SOCIAL HISTORY: Social History   Socioeconomic History   Marital status: Single    Spouse name: Not on file   Number of children: 2   Years of education: Not on file   Highest education level: Not on file  Occupational History   Occupation: retired  Tobacco Use   Smoking status: Former    Current packs/day: 0.00    Types: Cigarettes    Quit date: 10/01/2004    Years since quitting: 20.1   Smokeless tobacco: Never  Vaping Use   Vaping status: Never Used  Substance and Sexual Activity   Alcohol use: Yes    Comment: occasional   Drug use: Never   Sexual activity: Not Currently    Birth control/protection: Post-menopausal  Other Topics Concern   Not on file  Social History Narrative   Not on file   Social Drivers of Health   Tobacco Use: Medium Risk (12/01/2024)   Patient History    Smoking Tobacco Use: Former    Smokeless Tobacco Use: Never  Passive Exposure: Not on file  Financial Resource Strain: Not on file  Food Insecurity: Food Insecurity Present (10/18/2024)   Epic    Worried About Programme Researcher, Broadcasting/film/video in the Last Year: Never true    Ran Out of Food in the Last Year: Sometimes true  Transportation Needs: Unmet Transportation Needs (10/18/2024)   Epic    Lack of Transportation (Medical): Yes    Lack of Transportation (Non-Medical): Yes  Physical Activity: Not on file  Stress: Not on file  Social Connections: Moderately Isolated (10/18/2024)   Social Connection and Isolation Panel    Frequency of Communication with Friends and Family: Three times a week    Frequency of Social Gatherings with Friends and Family: Three times a week    Attends Religious Services: Never    Active Member of Clubs or Organizations: No    Attends Banker Meetings: Never    Marital Status: Married  Catering Manager Violence: Not At Risk (10/18/2024)   Epic    Fear of Current or Ex-Partner: No     Emotionally Abused: No    Physically Abused: No    Sexually Abused: No  Depression (PHQ2-9): Not on file  Alcohol Screen: Not on file  Housing: High Risk (10/18/2024)   Epic    Unable to Pay for Housing in the Last Year: Yes    Number of Times Moved in the Last Year: 0    Homeless in the Last Year: No  Utilities: Not At Risk (10/18/2024)   Epic    Threatened with loss of utilities: No  Health Literacy: Not on file    PHYSICAL EXAM  Vitals:   12/01/24 1031  BP: (!) 145/71  Pulse: 73  SpO2: 93%  Weight: 190 lb 8 oz (86.4 kg)  Height: 5' 2 (1.575 m)   Body mass index is 34.84 kg/m.  Generalized: Well developed, in no acute distress  Neurological examination  Mentation: Alert oriented to time, place, history taking. Follows all commands speech and language fluent Cranial nerve II-XII: Pupils were equal round reactive to light. Extraocular movements were full, visual field were full on confrontational test. Facial sensation and strength were normal. Head turning and shoulder shrug  were normal and symmetric. Motor: 4/5 right upper and lower extremities  Sensory: Sensory testing is intact to soft touch on all 4 extremities. No evidence of extinction is noted.  Coordination: Cerebellar testing reveals good finger-nose-finger bilaterally, slow with right heel-to-shin Gait and station: Gait is wide-based, independent, slow, mild limp on the right Reflexes: Deep tendon reflexes are symmetric and normal bilaterally.   DIAGNOSTIC DATA (LABS, IMAGING, TESTING) - I reviewed patient records, labs, notes, testing and imaging myself where available.  Lab Results  Component Value Date   WBC 8.1 10/19/2024   HGB 12.8 10/19/2024   HCT 39.0 10/19/2024   MCV 84.4 10/19/2024   PLT 261 10/19/2024      Component Value Date/Time   NA 136 10/23/2024 0228   K 4.4 10/23/2024 0228   CL 102 10/23/2024 0228   CO2 27 10/23/2024 0228   GLUCOSE 166 (H) 10/23/2024 0228   BUN 21 10/23/2024 0228    CREATININE 1.04 (H) 10/23/2024 0228   CALCIUM  8.9 10/23/2024 0228   PROT 7.8 10/17/2024 2210   ALBUMIN  3.6 10/17/2024 2210   AST 20 10/17/2024 2210   ALT 17 10/17/2024 2210   ALKPHOS 89 10/17/2024 2210   BILITOT 0.9 10/17/2024 2210   GFRNONAA 56 (L) 10/23/2024 0228  Lab Results  Component Value Date   CHOL 104 10/18/2024   HDL 40 (L) 10/18/2024   LDLCALC 44 10/18/2024   TRIG 98 10/18/2024   CHOLHDL 2.6 10/18/2024   Lab Results  Component Value Date   HGBA1C 8.4 (H) 10/18/2024   Lab Results  Component Value Date   VITAMINB12 200 06/19/2024   Lab Results  Component Value Date   TSH 2.645 10/18/2024    Lauraine Born, AGNP-C, DNP 12/01/2024, 10:48 AM [facility] [address] [place], [place] [address] [phone]      [1] No Known Allergies  "

## 2024-12-03 NOTE — Progress Notes (Signed)
 I agree with the above plan

## 2025-01-08 ENCOUNTER — Encounter (HOSPITAL_COMMUNITY)

## 2025-01-08 ENCOUNTER — Ambulatory Visit: Admitting: Vascular Surgery
